# Patient Record
Sex: Male | Born: 1945
Health system: Southern US, Community
[De-identification: ages and names within clinical notes are randomized; demographics above are authoritative.]

## PROBLEM LIST (undated history)

## (undated) DIAGNOSIS — D509 Iron deficiency anemia, unspecified: Secondary | ICD-10-CM

## (undated) DIAGNOSIS — J45909 Unspecified asthma, uncomplicated: Secondary | ICD-10-CM

## (undated) DIAGNOSIS — K760 Fatty (change of) liver, not elsewhere classified: Secondary | ICD-10-CM

## (undated) DIAGNOSIS — G4730 Sleep apnea, unspecified: Secondary | ICD-10-CM

## (undated) DIAGNOSIS — W57XXXA Bitten or stung by nonvenomous insect and other nonvenomous arthropods, initial encounter: Secondary | ICD-10-CM

## (undated) DIAGNOSIS — K5730 Diverticulosis of large intestine without perforation or abscess without bleeding: Secondary | ICD-10-CM

## (undated) DIAGNOSIS — H409 Unspecified glaucoma: Secondary | ICD-10-CM

## (undated) DIAGNOSIS — I10 Essential (primary) hypertension: Secondary | ICD-10-CM

## (undated) DIAGNOSIS — N410 Acute prostatitis: Secondary | ICD-10-CM

## (undated) DIAGNOSIS — M1990 Unspecified osteoarthritis, unspecified site: Secondary | ICD-10-CM

## (undated) DIAGNOSIS — K5903 Drug induced constipation: Secondary | ICD-10-CM

## (undated) DIAGNOSIS — K219 Gastro-esophageal reflux disease without esophagitis: Secondary | ICD-10-CM

## (undated) DIAGNOSIS — E7800 Pure hypercholesterolemia, unspecified: Secondary | ICD-10-CM

## (undated) DIAGNOSIS — L910 Hypertrophic scar: Secondary | ICD-10-CM

## (undated) DIAGNOSIS — Z86010 Personal history of colonic polyps: Secondary | ICD-10-CM

## (undated) DIAGNOSIS — M47816 Spondylosis without myelopathy or radiculopathy, lumbar region: Secondary | ICD-10-CM

## (undated) DIAGNOSIS — T7840XA Allergy, unspecified, initial encounter: Secondary | ICD-10-CM

## (undated) DIAGNOSIS — M8580 Other specified disorders of bone density and structure, unspecified site: Secondary | ICD-10-CM

## (undated) HISTORY — DX: Unspecified asthma, uncomplicated: J45.909

## (undated) HISTORY — DX: Fatty (change of) liver, not elsewhere classified: K76.0

## (undated) HISTORY — PX: SPINE SURGERY: SHX786

## (undated) HISTORY — DX: Allergy, unspecified, initial encounter: T78.40XA

## (undated) HISTORY — DX: Essential (primary) hypertension: I10

## (undated) HISTORY — DX: Iron deficiency anemia, unspecified: D50.9

## (undated) HISTORY — DX: Diverticulosis of large intestine without perforation or abscess without bleeding: K57.30

## (undated) HISTORY — DX: Personal history of colonic polyps: Z86.010

## (undated) HISTORY — DX: Other specified disorders of bone density and structure, unspecified site: M85.80

## (undated) HISTORY — PX: JOINT REPLACEMENT: SHX530

## (undated) HISTORY — DX: Spondylosis without myelopathy or radiculopathy, lumbar region: M47.816

## (undated) HISTORY — DX: Acute prostatitis: N41.0

## (undated) HISTORY — PX: COLONOSCOPY: SHX174

## (undated) HISTORY — DX: Sleep apnea, unspecified: G47.30

---

## 2000-10-20 ENCOUNTER — Encounter: Payer: Self-pay | Admitting: Internal Medicine

## 2000-10-20 ENCOUNTER — Ambulatory Visit (HOSPITAL_BASED_OUTPATIENT_CLINIC_OR_DEPARTMENT_OTHER): Admission: RE | Admit: 2000-10-20 | Discharge: 2000-10-20 | Payer: Self-pay | Admitting: Internal Medicine

## 2000-11-08 DIAGNOSIS — Z860100 Personal history of colon polyps, unspecified: Secondary | ICD-10-CM

## 2000-11-08 HISTORY — DX: Personal history of colonic polyps: Z86.010

## 2000-11-08 HISTORY — DX: Personal history of colon polyps, unspecified: Z86.0100

## 2000-11-16 ENCOUNTER — Ambulatory Visit (HOSPITAL_COMMUNITY): Admission: RE | Admit: 2000-11-16 | Discharge: 2000-11-16 | Payer: Self-pay | Admitting: Gastroenterology

## 2000-11-16 ENCOUNTER — Encounter (INDEPENDENT_AMBULATORY_CARE_PROVIDER_SITE_OTHER): Payer: Self-pay

## 2000-12-26 ENCOUNTER — Encounter: Payer: Self-pay | Admitting: Pulmonary Disease

## 2000-12-26 ENCOUNTER — Ambulatory Visit (HOSPITAL_BASED_OUTPATIENT_CLINIC_OR_DEPARTMENT_OTHER): Admission: RE | Admit: 2000-12-26 | Discharge: 2000-12-26 | Payer: Self-pay | Admitting: Pulmonary Disease

## 2001-11-08 HISTORY — PX: CHOLECYSTECTOMY: SHX55

## 2001-11-08 HISTORY — PX: BACK SURGERY: SHX140

## 2002-01-04 ENCOUNTER — Inpatient Hospital Stay (HOSPITAL_COMMUNITY): Admission: EM | Admit: 2002-01-04 | Discharge: 2002-01-06 | Payer: Self-pay | Admitting: Emergency Medicine

## 2002-01-05 ENCOUNTER — Encounter: Payer: Self-pay | Admitting: Orthopedic Surgery

## 2002-02-07 ENCOUNTER — Encounter: Payer: Self-pay | Admitting: Neurosurgery

## 2002-02-09 ENCOUNTER — Ambulatory Visit (HOSPITAL_COMMUNITY): Admission: RE | Admit: 2002-02-09 | Discharge: 2002-02-11 | Payer: Self-pay | Admitting: Neurosurgery

## 2002-02-09 ENCOUNTER — Encounter: Payer: Self-pay | Admitting: Neurosurgery

## 2002-05-11 ENCOUNTER — Encounter: Payer: Self-pay | Admitting: Emergency Medicine

## 2002-05-11 ENCOUNTER — Emergency Department (HOSPITAL_COMMUNITY): Admission: EM | Admit: 2002-05-11 | Discharge: 2002-05-11 | Payer: Self-pay | Admitting: Emergency Medicine

## 2002-05-30 ENCOUNTER — Observation Stay (HOSPITAL_COMMUNITY): Admission: RE | Admit: 2002-05-30 | Discharge: 2002-05-31 | Payer: Self-pay | Admitting: General Surgery

## 2002-05-30 ENCOUNTER — Encounter (INDEPENDENT_AMBULATORY_CARE_PROVIDER_SITE_OTHER): Payer: Self-pay | Admitting: Specialist

## 2004-05-27 ENCOUNTER — Encounter: Payer: Self-pay | Admitting: Internal Medicine

## 2005-05-03 ENCOUNTER — Ambulatory Visit: Payer: Self-pay | Admitting: Internal Medicine

## 2005-12-29 ENCOUNTER — Ambulatory Visit: Payer: Self-pay | Admitting: Internal Medicine

## 2006-06-13 ENCOUNTER — Ambulatory Visit: Payer: Self-pay | Admitting: Internal Medicine

## 2006-06-13 ENCOUNTER — Ambulatory Visit: Payer: Self-pay | Admitting: Pulmonary Disease

## 2006-06-15 ENCOUNTER — Ambulatory Visit: Payer: Self-pay | Admitting: Internal Medicine

## 2007-08-02 ENCOUNTER — Ambulatory Visit: Payer: Self-pay | Admitting: Internal Medicine

## 2007-08-02 DIAGNOSIS — K5730 Diverticulosis of large intestine without perforation or abscess without bleeding: Secondary | ICD-10-CM | POA: Insufficient documentation

## 2007-08-02 DIAGNOSIS — Z9889 Other specified postprocedural states: Secondary | ICD-10-CM | POA: Insufficient documentation

## 2007-08-02 DIAGNOSIS — G4730 Sleep apnea, unspecified: Secondary | ICD-10-CM | POA: Insufficient documentation

## 2007-08-02 DIAGNOSIS — I10 Essential (primary) hypertension: Secondary | ICD-10-CM

## 2007-08-02 HISTORY — DX: Essential (primary) hypertension: I10

## 2007-08-02 LAB — CONVERTED CEMR LAB
Bilirubin Urine: NEGATIVE
Blood in Urine, dipstick: NEGATIVE
Glucose, Urine, Semiquant: NEGATIVE
Ketones, urine, test strip: NEGATIVE
Nitrite: NEGATIVE
Protein, U semiquant: NEGATIVE
Specific Gravity, Urine: 1.015
Urobilinogen, UA: NEGATIVE
WBC Urine, dipstick: NEGATIVE
pH: 6.5

## 2007-08-04 LAB — CONVERTED CEMR LAB
ALT: 30 units/L (ref 0–53)
AST: 22 units/L (ref 0–37)
BUN: 10 mg/dL (ref 6–23)
Basophils Absolute: 0 10*3/uL (ref 0.0–0.1)
Basophils Relative: 0.2 % (ref 0.0–1.0)
CO2: 36 meq/L — ABNORMAL HIGH (ref 19–32)
Calcium: 9.2 mg/dL (ref 8.4–10.5)
Chloride: 105 meq/L (ref 96–112)
Cholesterol: 161 mg/dL (ref 0–200)
Creatinine, Ser: 1 mg/dL (ref 0.4–1.5)
Eosinophils Absolute: 0.3 10*3/uL (ref 0.0–0.6)
Eosinophils Relative: 6.9 % — ABNORMAL HIGH (ref 0.0–5.0)
GFR calc Af Amer: 99 mL/min
GFR calc non Af Amer: 82 mL/min
Glucose, Bld: 85 mg/dL (ref 70–99)
HCT: 39.4 % (ref 39.0–52.0)
HDL: 36.3 mg/dL — ABNORMAL LOW (ref >39.0)
Hemoglobin: 13.2 g/dL (ref 13.0–17.0)
LDL Cholesterol: 106 mg/dL — ABNORMAL HIGH (ref 0–99)
Lymphocytes Relative: 38.8 % (ref 12.0–46.0)
MCHC: 33.5 g/dL (ref 30.0–36.0)
MCV: 89.4 fL (ref 78.0–100.0)
Monocytes Absolute: 0.6 10*3/uL (ref 0.2–0.7)
Monocytes Relative: 14.7 % — ABNORMAL HIGH (ref 3.0–11.0)
Neutro Abs: 1.6 10*3/uL (ref 1.4–7.7)
Neutrophils Relative %: 39.4 % — ABNORMAL LOW (ref 43.0–77.0)
PSA: 0.55 ng/mL (ref 0.10–4.00)
Platelets: 239 10*3/uL (ref 150–400)
Potassium: 3.9 meq/L (ref 3.5–5.1)
RBC: 4.4 M/uL (ref 4.22–5.81)
RDW: 12.8 % (ref 11.5–14.6)
Sodium: 144 meq/L (ref 135–145)
TSH: 1.44 microintl units/mL (ref 0.35–5.50)
Total CHOL/HDL Ratio: 4.4
Triglycerides: 93 mg/dL (ref 0–149)
VLDL: 19 mg/dL (ref 0–40)
WBC: 4 10*3/uL — ABNORMAL LOW (ref 4.5–10.5)

## 2007-10-31 ENCOUNTER — Encounter: Payer: Self-pay | Admitting: Internal Medicine

## 2007-12-01 ENCOUNTER — Telehealth: Payer: Self-pay | Admitting: Internal Medicine

## 2007-12-12 ENCOUNTER — Encounter: Payer: Self-pay | Admitting: Internal Medicine

## 2007-12-25 ENCOUNTER — Telehealth (INDEPENDENT_AMBULATORY_CARE_PROVIDER_SITE_OTHER): Payer: Self-pay | Admitting: *Deleted

## 2008-03-18 ENCOUNTER — Telehealth (INDEPENDENT_AMBULATORY_CARE_PROVIDER_SITE_OTHER): Payer: Self-pay | Admitting: *Deleted

## 2008-08-14 ENCOUNTER — Ambulatory Visit: Payer: Self-pay | Admitting: Internal Medicine

## 2008-09-25 ENCOUNTER — Telehealth (INDEPENDENT_AMBULATORY_CARE_PROVIDER_SITE_OTHER): Payer: Self-pay | Admitting: *Deleted

## 2008-12-18 ENCOUNTER — Encounter: Payer: Self-pay | Admitting: Internal Medicine

## 2009-01-10 ENCOUNTER — Encounter: Payer: Self-pay | Admitting: Internal Medicine

## 2009-01-19 ENCOUNTER — Encounter: Payer: Self-pay | Admitting: Internal Medicine

## 2009-01-19 ENCOUNTER — Emergency Department (HOSPITAL_BASED_OUTPATIENT_CLINIC_OR_DEPARTMENT_OTHER): Admission: EM | Admit: 2009-01-19 | Discharge: 2009-01-19 | Payer: Self-pay | Admitting: Emergency Medicine

## 2009-01-19 ENCOUNTER — Ambulatory Visit: Payer: Self-pay | Admitting: Diagnostic Radiology

## 2009-01-19 ENCOUNTER — Telehealth: Payer: Self-pay | Admitting: Family Medicine

## 2009-01-20 ENCOUNTER — Ambulatory Visit: Payer: Self-pay | Admitting: Internal Medicine

## 2009-01-20 DIAGNOSIS — M549 Dorsalgia, unspecified: Secondary | ICD-10-CM | POA: Insufficient documentation

## 2009-02-12 ENCOUNTER — Ambulatory Visit: Payer: Self-pay | Admitting: Internal Medicine

## 2009-03-13 ENCOUNTER — Ambulatory Visit: Payer: Self-pay | Admitting: Pulmonary Disease

## 2009-03-13 DIAGNOSIS — Z9989 Dependence on other enabling machines and devices: Secondary | ICD-10-CM

## 2009-03-13 DIAGNOSIS — G4733 Obstructive sleep apnea (adult) (pediatric): Secondary | ICD-10-CM | POA: Insufficient documentation

## 2009-03-13 HISTORY — DX: Obstructive sleep apnea (adult) (pediatric): G47.33

## 2009-03-31 ENCOUNTER — Telehealth (INDEPENDENT_AMBULATORY_CARE_PROVIDER_SITE_OTHER): Payer: Self-pay | Admitting: *Deleted

## 2009-04-02 ENCOUNTER — Encounter: Payer: Self-pay | Admitting: Gastroenterology

## 2009-04-15 ENCOUNTER — Encounter: Payer: Self-pay | Admitting: Pulmonary Disease

## 2009-07-25 ENCOUNTER — Telehealth (INDEPENDENT_AMBULATORY_CARE_PROVIDER_SITE_OTHER): Payer: Self-pay | Admitting: *Deleted

## 2009-10-15 ENCOUNTER — Telehealth (INDEPENDENT_AMBULATORY_CARE_PROVIDER_SITE_OTHER): Payer: Self-pay | Admitting: *Deleted

## 2009-10-15 ENCOUNTER — Encounter (INDEPENDENT_AMBULATORY_CARE_PROVIDER_SITE_OTHER): Payer: Self-pay | Admitting: *Deleted

## 2009-12-22 ENCOUNTER — Encounter: Payer: Self-pay | Admitting: Pulmonary Disease

## 2010-01-08 ENCOUNTER — Ambulatory Visit: Payer: Self-pay | Admitting: Internal Medicine

## 2010-01-08 LAB — CONVERTED CEMR LAB: Vit D, 25-Hydroxy: 16 ng/mL — ABNORMAL LOW (ref 30–89)

## 2010-01-09 ENCOUNTER — Telehealth: Payer: Self-pay | Admitting: Internal Medicine

## 2010-01-09 ENCOUNTER — Encounter (INDEPENDENT_AMBULATORY_CARE_PROVIDER_SITE_OTHER): Payer: Self-pay | Admitting: *Deleted

## 2010-01-12 LAB — CONVERTED CEMR LAB
ALT: 27 units/L (ref 0–53)
AST: 24 units/L (ref 0–37)
BUN: 10 mg/dL (ref 6–23)
Basophils Absolute: 0 10*3/uL (ref 0.0–0.1)
Basophils Relative: 0.6 % (ref 0.0–3.0)
CO2: 34 meq/L — ABNORMAL HIGH (ref 19–32)
Calcium: 9.3 mg/dL (ref 8.4–10.5)
Chloride: 102 meq/L (ref 96–112)
Cholesterol: 163 mg/dL (ref 0–200)
Creatinine, Ser: 0.9 mg/dL (ref 0.4–1.5)
Eosinophils Absolute: 0.2 10*3/uL (ref 0.0–0.7)
Eosinophils Relative: 5.1 % — ABNORMAL HIGH (ref 0.0–5.0)
GFR calc non Af Amer: 110.23 mL/min (ref >60)
Glucose, Bld: 99 mg/dL (ref 70–99)
HCT: 42.6 % (ref 39.0–52.0)
HDL: 45.8 mg/dL (ref >39.00)
Hemoglobin: 13.8 g/dL (ref 13.0–17.0)
LDL Cholesterol: 105 mg/dL — ABNORMAL HIGH (ref 0–99)
Lymphocytes Relative: 37.8 % (ref 12.0–46.0)
Lymphs Abs: 1.4 10*3/uL (ref 0.7–4.0)
MCHC: 32.5 g/dL (ref 30.0–36.0)
MCV: 91.3 fL (ref 78.0–100.0)
Monocytes Absolute: 0.5 10*3/uL (ref 0.1–1.0)
Monocytes Relative: 14.6 % — ABNORMAL HIGH (ref 3.0–12.0)
Neutro Abs: 1.6 10*3/uL (ref 1.4–7.7)
Neutrophils Relative %: 41.9 % — ABNORMAL LOW (ref 43.0–77.0)
PSA: 0.68 ng/mL (ref 0.10–4.00)
Platelets: 197 10*3/uL (ref 150.0–400.0)
Potassium: 3.5 meq/L (ref 3.5–5.1)
RBC: 4.66 M/uL (ref 4.22–5.81)
RDW: 12.6 % (ref 11.5–14.6)
Sodium: 141 meq/L (ref 135–145)
TSH: 1.16 microintl units/mL (ref 0.35–5.50)
Total CHOL/HDL Ratio: 4
Triglycerides: 61 mg/dL (ref 0.0–149.0)
VLDL: 12.2 mg/dL (ref 0.0–40.0)
WBC: 3.7 10*3/uL — ABNORMAL LOW (ref 4.5–10.5)

## 2010-02-10 ENCOUNTER — Encounter (INDEPENDENT_AMBULATORY_CARE_PROVIDER_SITE_OTHER): Payer: Self-pay | Admitting: *Deleted

## 2010-02-11 ENCOUNTER — Ambulatory Visit: Payer: Self-pay | Admitting: Gastroenterology

## 2010-02-18 ENCOUNTER — Ambulatory Visit: Payer: Self-pay | Admitting: Gastroenterology

## 2010-03-12 ENCOUNTER — Ambulatory Visit: Payer: Self-pay | Admitting: Pulmonary Disease

## 2010-05-08 ENCOUNTER — Telehealth (INDEPENDENT_AMBULATORY_CARE_PROVIDER_SITE_OTHER): Payer: Self-pay | Admitting: *Deleted

## 2010-09-08 ENCOUNTER — Emergency Department (HOSPITAL_BASED_OUTPATIENT_CLINIC_OR_DEPARTMENT_OTHER): Admission: EM | Admit: 2010-09-08 | Discharge: 2010-09-08 | Payer: Self-pay | Admitting: Emergency Medicine

## 2010-11-13 ENCOUNTER — Encounter: Payer: Self-pay | Admitting: Pulmonary Disease

## 2010-12-06 LAB — CONVERTED CEMR LAB
ALT: 22 units/L (ref 0–53)
AST: 21 units/L (ref 0–37)
Albumin: 4 g/dL (ref 3.5–5.2)
Alkaline Phosphatase: 71 units/L (ref 39–117)
BUN: 14 mg/dL (ref 6–23)
Basophils Absolute: 0 10*3/uL (ref 0.0–0.1)
Basophils Relative: 0.3 % (ref 0.0–3.0)
Bilirubin, Direct: 0.1 mg/dL (ref 0.0–0.3)
CO2: 35 meq/L — ABNORMAL HIGH (ref 19–32)
Calcium: 9 mg/dL (ref 8.4–10.5)
Chloride: 102 meq/L (ref 96–112)
Cholesterol: 159 mg/dL (ref 0–200)
Creatinine, Ser: 1 mg/dL (ref 0.4–1.5)
Eosinophils Absolute: 0.3 10*3/uL (ref 0.0–0.7)
Eosinophils Relative: 6.3 % — ABNORMAL HIGH (ref 0.0–5.0)
GFR calc Af Amer: 98 mL/min
GFR calc non Af Amer: 81 mL/min
Glucose, Bld: 90 mg/dL (ref 70–99)
HCT: 40.5 % (ref 39.0–52.0)
HDL: 38.9 mg/dL — ABNORMAL LOW (ref >39.0)
Hemoglobin: 13.9 g/dL (ref 13.0–17.0)
LDL Cholesterol: 107 mg/dL — ABNORMAL HIGH (ref 0–99)
Lymphocytes Relative: 35.3 % (ref 12.0–46.0)
MCHC: 34.4 g/dL (ref 30.0–36.0)
MCV: 87.6 fL (ref 78.0–100.0)
Monocytes Absolute: 0.7 10*3/uL (ref 0.1–1.0)
Monocytes Relative: 17.4 % — ABNORMAL HIGH (ref 3.0–12.0)
Neutro Abs: 1.6 10*3/uL (ref 1.4–7.7)
Neutrophils Relative %: 40.7 % — ABNORMAL LOW (ref 43.0–77.0)
PSA: 0.51 ng/mL (ref 0.10–4.00)
Platelets: 217 10*3/uL (ref 150–400)
Potassium: 3.7 meq/L (ref 3.5–5.1)
RBC: 4.62 M/uL (ref 4.22–5.81)
RDW: 12.6 % (ref 11.5–14.6)
Sodium: 143 meq/L (ref 135–145)
TSH: 1.24 microintl units/mL (ref 0.35–5.50)
Total Bilirubin: 1 mg/dL (ref 0.3–1.2)
Total CHOL/HDL Ratio: 4.1
Total Protein: 6.7 g/dL (ref 6.0–8.3)
Triglycerides: 64 mg/dL (ref 0–149)
VLDL: 13 mg/dL (ref 0–40)
WBC: 4 10*3/uL — ABNORMAL LOW (ref 4.5–10.5)

## 2010-12-08 NOTE — Miscellaneous (Signed)
Summary: [facility] Previsit/prep  Clinical Lists Changes  Medications: Added new medication of MOVIPREP 100 GM  SOLR (PEG-KCL-NACL-NASULF-NA ASC-C) As per prep instructions. - Signed Rx of MOVIPREP 100 GM  SOLR (PEG-KCL-NACL-NASULF-NA ASC-C) As per prep instructions.;  #1 x 0;  Signed;  Entered by: Wyona Almas RN;  Authorized by: Mardella Layman MD [facility];  Method used: Electronically to [facility] [address] [place], [place], [place]  [address], Ph: [phone] or [phone], Fax: [phone] Allergies: Changed allergy or adverse reaction from * CLINDAMYCIN to * CLINDAMYCIN    Prescriptions: MOVIPREP 100 GM  SOLR (PEG-KCL-NACL-NASULF-NA ASC-C) As per prep instructions.  #1 x 0   Entered by:   Wyona Almas RN   Authorized by:   Mardella Layman MD [facility]   Signed by:   Wyona Almas RN on 02/11/2010   Method used:   Electronically to        [facility] [address] (retail)       [address]       [place], [place]  [address]       Ph: [phone] or [phone]       Fax: [phone]   RxID:   [id number]

## 2010-12-08 NOTE — Assessment & Plan Note (Signed)
Summary: rov for osa   Copy to:  [facility] Primary Provider/Referring Provider:  Nolon Rod. Paz MD  CC:  Pt is here for a 1 yr f/u appt.   Pt states he is wearing his cpap machine every night.  Approx 6 hours per night.  Pt denied any complaints with mask or pressure.  Pt denied any new concerns. .  History of Present Illness: the pt comes in today for f/u of his known osa.  He is wearing cpap compliantly, and reports no issues with his mask or pressure.  He uses everynight, and feels he sleeps well with excellent daytime alertness.  Current Medications (verified): 1)  Hydrochlorothiazide 25 Mg  Tabs (Hydrochlorothiazide) .Marland Kitchen.. 1 By Mouth Qd 2)  Aspirin 81 Mg  Tbec (Aspirin) 3)  Metoprolol Succinate 25 Mg Xr24h-Tab (Metoprolol Succinate) .... 1/2 By Mouth Once Daily 4)  Klor-Con M20 20 Meq  Tbcr (Potassium Chloride Crys Cr) .... One P.o. Daily 5)  Ergocalciferol 50000 Unit Caps (Ergocalciferol) .Marland Kitchen.. 1 By Mouth Qwk X 12 Weeks  Allergies (verified): 1)  ! * Clindamycin  Review of Systems      See HPI  Vital Signs:  Patient profile:   65 year old male Height:      72 inches Weight:      337 pounds BMI:     45.87 O2 Sat:      95 % on Room air Temp:     98.0 degrees F oral Pulse rate:   69 / minute BP sitting:   114 / 70  (left arm) Cuff size:   large  Vitals Entered By: Arman Filter LPN (Mar 12, 4741 9:19 AM)  O2 Flow:  Room air CC: Pt is here for a 1 yr f/u appt.   Pt states he is wearing his cpap machine every night.  Approx 6 hours per night.  Pt denied any complaints with mask or pressure.  Pt denied any new concerns.  Comments Medications reviewed with patient Arman Filter LPN  Mar 13, 5955 9:19 AM    Physical Exam  General:  obese male in nad Nose:  no skin breakdown or pressure necrosis from cpap mask   Impression & Recommendations:  Problem # 1:  OBSTRUCTIVE SLEEP APNEA (ICD-327.23)  the pt is doing well with cpap.  He is satisfied with his sleep and daytime  alertness.  I have encouraged him to work aggressively on weight loss.  Other Orders: Est. Patient Level II (38756)  Patient Instructions: 1)  no change in cpap 2)  work on weight loss 3)  followup with me in 1 year.

## 2010-12-08 NOTE — Procedures (Signed)
Summary: Recall / [facility]  Recall /  [facility]   Imported By: Lennie Odor 04/10/2010 14:11:11  _____________________________________________________________________  External Attachment:    Type:   Image     Comment:   External Document

## 2010-12-08 NOTE — Procedures (Signed)
Summary: Colonoscopy  Patient: Cameron [name] Note: All result statuses are Final unless otherwise noted.  Tests: (1) Colonoscopy (COL)   COL Colonoscopy           DONE     [facility]     [address]     [place], [place]  [address]           COLONOSCOPY PROCEDURE REPORT           PATIENT:  Cameron, [name]  MR#:  [id number]     BIRTHDATE:  1946-03-06, 63 yrs. old  GENDER:  male     ENDOSCOPIST:  Vania Rea. Jarold Motto, MD, [facility]     REF. BY:     PROCEDURE DATE:  02/18/2010     PROCEDURE:  Higher-risk screening colonoscopy G0105           ASA CLASS:  Class II     INDICATIONS:  family history of colon cancer, history of     pre-cancerous (adenomatous) colon polyps, surveillance and     high-risk screening     MEDICATIONS:   Fentanyl 25 mcg IV, Versed 5 mg IV           DESCRIPTION OF PROCEDURE:   After the risks benefits and     alternatives of the procedure were thoroughly explained, informed     consent was obtained.  Digital rectal exam was performed and     revealed no abnormalities.   The LB [id number] endoscope     was introduced through the anus and advanced to the cecum, which     was identified by both the appendix and ileocecal valve, without     limitations.  The quality of the prep was excellent, using     MoviPrep.  The instrument was then slowly withdrawn as the colon     was fully examined.     <<PROCEDUREIMAGES>>           FINDINGS:  Severe diverticulosis was found in the sigmoid to     descending colon segments.  No polyps or cancers were seen.  This     was otherwise a normal examination of the colon.   Retroflexed     views in the rectum revealed no abnormalities.    The scope was     then withdrawn from the patient and the procedure completed.           COMPLICATIONS:  None     ENDOSCOPIC IMPRESSION:     1) Severe diverticulosis in the sigmoid to descending colon     segments     2) No polyps or cancers     3) Otherwise normal  examination     RECOMMENDATIONS:     1) high fiber diet     2) Follow up colonoscopy in 5 years     REPEAT EXAM:  No           ______________________________     Vania Rea. Jarold Motto, MD, [facility]           CC:  Willow Ora, MD           n.     Rosalie Doctor:   Vania Rea. Patterson at 02/18/2010 09:03 AM           Marybelle Killings, [id number]  Note: An exclamation mark (!) indicates a result that was not dispersed into the flowsheet. Document Creation Date: 02/18/2010 9:04 AM _______________________________________________________________________  (1) Order result status: Final Collection or observation  date-time: 02/18/2010 08:58 Requested date-time:  Receipt date-time:  Reported date-time:  Referring Physician:   Ordering Physician: Sheryn Bison [phone]) Specimen Source:  Source: Launa Grill Order Number: [id number] Lab site:   Appended Document: Colonoscopy    Clinical Lists Changes  Observations: Added new observation of COLONNXTDUE: 02/2015 (02/18/2010 14:13)

## 2010-12-08 NOTE — Progress Notes (Signed)
Summary: GENERIC ALTERNATIVE TOPROL//PAZ  Phone Note From Pharmacy   Caller: [facility]*[phone] EXT [phone] Summary of Call: CALLED IN REF TO RX TOPROL PT ASKED FOR  GENERIC  METPROL SUCCINATE COULD SAVE PT $198 A YEAR FOR 90 DAY SUPPLY IF DOCTOR  APPROVES SEND 90 DAY RX FOR METOPROL SUCCINATE. REF [id number]  Initial call taken by: Kandice Hams,  July 25, 2009 4:32 PM  Follow-up for Phone Call        ok to switch if patient agreable same dosing  Jose E. Paz MD  July 26, 2009 11:13 AM  Rx changed and pt says he is agreeable to the change    New/Updated Medications: METOPROLOL SUCCINATE 25 MG XR24H-TAB (METOPROLOL SUCCINATE) 1/2 by mouth once daily Prescriptions: METOPROLOL SUCCINATE 25 MG XR24H-TAB (METOPROLOL SUCCINATE) 1/2 by mouth once daily  #90 x 0   Entered by:   Kandice Hams   Authorized by:   Nolon Rod. Paz MD   Signed by:   Kandice Hams on 07/28/2009   Method used:   Faxed to ...       [facility]* (mail-order)             ,          Ph: [phone]       Fax: [phone]   RxID:   [id number]

## 2010-12-08 NOTE — Letter (Signed)
Summary: CMN for CPAP Supplies/[facility]  CMN for CPAP Supplies/[facility]   Imported By: Sherian Rein 04/21/2009 08:54:34  _____________________________________________________________________  External Attachment:    Type:   Image     Comment:   External Document

## 2010-12-08 NOTE — Procedures (Signed)
Summary: Colonoscopy/[facility]  Colonoscopy/[facility]   Imported By: Lanelle Bal 01/15/2010 09:36:11  _____________________________________________________________________  External Attachment:    Type:   Image     Comment:   External Document

## 2010-12-08 NOTE — Letter (Signed)
Summary: Previsit letter  [facility]  [address] [place], [place] [address]   Phone: [phone]  Fax: [phone]       01/09/2010 MRN: [id number]  [facility] [address] [place], [place]  [address]  Dear Mr. Atteberry,  Welcome to the Gastroenterology Division at [facility].    You are scheduled to see a nurse for your pre-procedure visit on 02/11/2010 at 4:00pm on the 3rd floor at [facility], [address]  We ask that you try to arrive at our office 15 minutes prior to your appointment time to allow for check-in.  Your nurse visit will consist of discussing your medical and surgical history, your immediate family medical history, and your medications.    Please bring a complete list of all your medications or, if you prefer, bring the medication bottles and we will list them.  We will need to be aware of both prescribed and over the counter drugs.  We will need to know exact dosage information as well.  If you are on blood thinners (Coumadin, Plavix, Aggrenox, Ticlid, etc.) please call our office today/prior to your appointment, as we need to consult with your physician about holding your medication.   Please be prepared to read and sign documents such as consent forms, a financial agreement, and acknowledgement forms.  If necessary, and with your consent, a friend or relative is welcome to sit-in on the nurse visit with you.  Please bring your insurance card so that we may make a copy of it.  If your insurance requires a referral to see a specialist, please bring your referral form from your primary care physician.  No co-pay is required for this nurse visit.     If you cannot keep your appointment, please call [phone] to cancel or reschedule prior to your appointment date.  This allows [place] the opportunity to schedule an appointment for another patient in need of care.    Thank you for choosing [facility] for your medical  needs.  We appreciate the opportunity to care for you.  Please visit [place] at our website  to learn more about our practice.                     Sincerely.                                                                                                                   The Gastroenterology Division

## 2010-12-08 NOTE — Progress Notes (Signed)
Summary: REFILL  Phone Note Refill Request Message from:  Pharmacy on [facility]/FAX# [phone]  Refills Requested: Medication #1:  HYDROCHLOROTHIAZIDE 25 MG  TABS 1 by mouth qd TAKE 1 TABLET DAILY  Initial call taken by: Barb Merino,  Mar 31, 2009 12:34 PM      Prescriptions: HYDROCHLOROTHIAZIDE 25 MG  TABS (HYDROCHLOROTHIAZIDE) 1 by mouth qd  #90 x 1   Entered by:   Kandice Hams   Authorized by:   Nolon Rod. Paz MD   Signed by:   Kandice Hams on 03/31/2009   Method used:   Faxed to ...       [facility]* (mail-order)             ,          Ph: [phone]       Fax: [phone]   RxID:   [id number]

## 2010-12-08 NOTE — Progress Notes (Signed)
Summary: Refill  Phone Note Refill Request Call back at Home Phone [phone] Message from:  Patient wife on May 08, 2010 1:56 PM  Refills Requested: Medication #1:  HYDROCHLOROTHIAZIDE 25 MG  TABS 1 by mouth qd   Supply Requested: 3 months Fax to [facility]  Initial call taken by: Barnie Mort,  May 08, 2010 1:56 PM Caller: Patient Call For: Wildwood E. Paz MD    Prescriptions: HYDROCHLOROTHIAZIDE 25 MG  TABS (HYDROCHLOROTHIAZIDE) 1 by mouth qd  #90 x 1   Entered by:   Army Fossa CMA   Authorized by:   Nolon Rod. Paz MD   Signed by:   Army Fossa CMA on 05/08/2010   Method used:   Faxed to ...       [facility] ([facility])             , [place]         Ph: [phone]       Fax: [phone]   RxID:   [id number]

## 2010-12-08 NOTE — Progress Notes (Signed)
Summary: REFILL FOR TOPROL-lmom  Phone Note Refill Request   Refills Requested: Medication #1:  TOPROL XL 50 MG  TB24 1/2 by mouth qd [BMN]   Last Refilled: 10/26/2007 RX RECEIVED VIA FAX FROM [facility] [place] FAX IS [phone]. THE TOPROL XL IS NOT AVAILABLE WE NEED AN RX FOR THE PLAN METOPROLOL PLZ [facility]  Initial call taken by: Job Founds,  December 01, 2007 11:14 AM  Follow-up for Phone Call        ok to change but tell pt to check BP and be sure is arounf 120/80 Follow-up by: Nolon Rod. Eddie Koc MD,  December 01, 2007 2:15 PM  Additional Follow-up for Phone Call Additional follow up Details #1::        rx faxed to pharmacy  Left message on machine to return call. ..................................................................Marland KitchenShary Decamp  December 01, 2007 2:21 PM discussed with pt ..................................................................Marland KitchenShary Decamp  December 05, 2007 9:29 AM     New/Updated Medications: METOPROLOL TARTRATE 25 MG  TABS (METOPROLOL TARTRATE) 1/2 by mouth bid   Prescriptions: METOPROLOL TARTRATE 25 MG  TABS (METOPROLOL TARTRATE) 1/2 by mouth bid  #60 x 3   Entered and Authorized by:   Nolon Rod. Yaretsi Humphres MD   Signed by:   Nolon Rod. Jaskarn Schweer MD on 12/01/2007   Method used:   Print then Give to Patient   RxID:   [id number]

## 2010-12-08 NOTE — Procedures (Signed)
Summary: colonoscopy report  colonoscopy report   Imported By: Freddy Jaksch 08/07/2007 09:59:00  _____________________________________________________________________  External Attachment:    Type:   Image     Comment:   internal

## 2010-12-08 NOTE — Assessment & Plan Note (Signed)
Summary: acute only - pain shoulder blade,went to ed/cbs   Vital Signs:  Patient profile:   65 year old male Weight:      336.6 pounds Temp:     97.8 degrees F oral Pulse rate:   74 / minute BP sitting:   120 / 82  (left arm)  Vitals Entered By: Jeremy Johann CMA (January 20, 2009 4:07 PM) Comments pain in left shoulder blade x2days, sudden onset   History of Present Illness: pain betwen the L shoulder bladde and spine, on-off x 2 days, worse if bends tie his shoe laces and w/  cough, better w/ certain positions   ER 01-19-09 CXR ----> Minimal streaky basilar atelectasis but no infiltrates, edema or  effusions. cardiac enz x 1 : neg  EKG reportedly done  Allergies: 1)  ! * Clindamycin  Past History:  Past Medical History:    Reviewed history from 08/14/2008 and no changes required:    Hypertension    Diverticulosis, colon    Osteopenia DEXA 12-2006    Sleep apnea  Past Surgical History:    Reviewed history from 08/02/2007 and no changes required:    Cholecystectomy    back surgery 2003  Social History:    Reviewed history from 08/02/2007 and no changes required:       2 kids       Married  Review of Systems General:  Denies fever. CV:  mild cough on-off. Resp:  Denies shortness of breath. GI:  Denies abdominal pain and bloody stools; slightly  constipated x 2 days "feel like I need a BM but I don't". GU:  Denies dysuria and hematuria. Derm:  Denies rash.  Physical Exam  General:  alert and well-developed.   Lungs:  normal respiratory effort, no intercostal retractions, no accessory muscle use, and normal breath sounds.   Heart:  normal rate, regular rhythm, and no murmur.   Abdomen:  soft, non-tender, and no distention.   Msk:  no tender at area of pain (Left  from T10) Extremities:  no pretibial edema bilaterally    Impression & Recommendations:  Problem # 1:  BACK PAIN, UPPER (ICD-724.5) symptoms seem muscle skeletal will get the EKG done at the ER  although symptoms doesn't seem cardiac  we agreed he will try the ibuprofen and steroids Rx @ the ER  and call of no better or if worse declined flexeril also some GI symptoms , slightly  constipated (see instructions)   His updated medication list for this problem includes:    Aspirin 81 Mg Tbec (Aspirin)  Complete Medication List: 1)  Hydrochlorothiazide 25 Mg Tabs (Hydrochlorothiazide) .Marland Kitchen.. 1 by mouth qd 2)  Aspirin 81 Mg Tbec (Aspirin) 3)  Metoprolol Tartrate 25 Mg Tabs (Metoprolol tartrate) .... 1/2 by mouth once daily` 4)  Klor-con M20 20 Meq Tbcr (Potassium chloride crys cr) .... One p.o. daily  Patient Instructions: 1)  myralax 17gr a day with fluids x 2 weeks  2)  The medication list was reviewed and reconciled.  All changed / newly prescribed medications were explained.  A complete medication list was provided to the patient / caregiver.

## 2010-12-08 NOTE — Assessment & Plan Note (Signed)
Summary: CPX--PH   Vital Signs:  Patient Profile:   65 Years Old Male Weight:      336.8 pounds Pulse rate:   60 / minute BP sitting:   110 / 70  Vitals Entered By: Shary Decamp (August 14, 2008 9:43 AM)                 Chief Complaint:  cpx - fasting; bp @ work 120/80 -- pt has only been taking metoprolol 1/2 qd.  History of Present Illness: cpx - fasting bp @ work 120/80  pt has only been taking metoprolol 1/2 once daily occ tingling at R thumb, thisnks related to computer use; no finger numbness    Updated Prior Medication List: HYDROCHLOROTHIAZIDE 25 MG  TABS (HYDROCHLOROTHIAZIDE) 1 by mouth qd ASPIRIN 81 MG  TBEC (ASPIRIN)  METOPROLOL TARTRATE 25 MG  TABS (METOPROLOL TARTRATE) 1/2 by mouth once daily` KLOR-CON M20 20 MEQ  TBCR (POTASSIUM CHLORIDE CRYS CR) one p.o. daily  Current Allergies (reviewed today): ! * CLINDAMYCIN  Past Medical History:    Reviewed history from 08/02/2007 and no changes required:       Hypertension       Diverticulosis, colon       Osteopenia DEXA 12-2006       Sleep apnea  Past Surgical History:    Reviewed history from 08/02/2007 and no changes required:       Cholecystectomy       back surgery 2003   Family History:    Reviewed history from 08/02/2007 and no changes required:       colon ca--uncle       prostate cancer--no        MI--no       no close relatives w/ DM       HTN-- F  Social History:    Reviewed history from 08/02/2007 and no changes required:       2 kids       Married   Risk Factors: Tobacco use:  never Alcohol use:  yes  Colonoscopy History:    Date of Last Colonoscopy:  05/08/2004   Review of Systems       less active than last year in part b/c problems w/ achiles tendon (saw podiatry) diet-- has cut down of fat  CV      Denies chest pain or discomfort.      occ swelling at the ned of the day @ LE  Resp      Denies shortness of breath.  GI      Denies bloody stools, diarrhea, and  nausea.  GU      Denies hematuria and urinary hesitancy.   Physical Exam  General:     alert and overweight-appearing.   Neck:     no thyromegaly.   Lungs:     normal respiratory effort, no intercostal retractions, no accessory muscle use, and normal breath sounds.   Heart:     normal rate, regular rhythm, and no murmur.   Abdomen:     soft, non-tender, normal bowel sounds, no distention, no hepatomegaly, and no splenomegaly.   Rectal:     No external abnormalities noted. Normal sphincter tone. No rectal masses or tenderness. Prostate:     very limited due to pt. size but what was reached was normal Extremities:     trace pretibial edema bilaterally  Psych:     Oriented X3, not anxious appearing, and not depressed appearing.  Impression & Recommendations:  Problem # 1:  HEALTH SCREENING (ICD-V70.0)  Last Colonoscopy:  05/08/2004, per report, next 2010 Td 07 flu shot today diet - exercise discussed EKN NSR Orders: EKG w/ Interpretation (93000) Venipuncture (94854) TLB-BMP (Basic Metabolic Panel-BMET) (80048-METABOL) TLB-CBC Platelet - w/Differential (85025-CBCD) TLB-Hepatic/Liver Function Pnl (80076-HEPATIC) TLB-Lipid Panel (80061-LIPID) TLB-PSA (Prostate Specific Antigen) (84153-PSA) TLB-TSH (Thyroid Stimulating Hormone) (84443-TSH)   Complete Medication List: 1)  Hydrochlorothiazide 25 Mg Tabs (Hydrochlorothiazide) .Marland Kitchen.. 1 by mouth qd 2)  Aspirin 81 Mg Tbec (Aspirin) 3)  Metoprolol Tartrate 25 Mg Tabs (Metoprolol tartrate) .... 1/2 by mouth once daily` 4)  Klor-con M20 20 Meq Tbcr (Potassium chloride crys cr) .... One p.o. daily   Patient Instructions: 1)  Please schedule a follow-up appointment in 6 months.   ]  Influenza Immunization History:    Influenza # 1:  Historical (08/09/2008)

## 2010-12-08 NOTE — Progress Notes (Signed)
Summary: Paz--Refill  Phone Note Refill Request   Refills Requested: Medication #1:  HYDROCHLOROTHIAZIDE 25 MG  TABS 1 by mouth qd RX received via fax from [facility]-[phone]  Initial call taken by: Freddy Jaksch,  Mar 18, 2008 2:31 PM      Prescriptions: HYDROCHLOROTHIAZIDE 25 MG  TABS (HYDROCHLOROTHIAZIDE) 1 by mouth qd  #90 x 1   Entered by:   Shary Decamp   Authorized by:   Nolon Rod. Paz MD   Signed by:   Shary Decamp on 03/18/2008   Method used:   Printed then faxed to ...       [facility]             , [place]         Ph:        Fax: [phone]   RxID:   [id number]

## 2010-12-08 NOTE — Assessment & Plan Note (Signed)
Summary: CPX   PH   Vital Signs:  Patient Profile:   65 Years Old Male Weight:      343 pounds Pulse rate:   64 / minute Resp:     12 per minute BP sitting:   118 / 80  Vitals Entered By: Shary Decamp (August 02, 2007 9:44 AM)             Is Patient Diabetic? No     Chief Complaint:  yearly.  History of Present Illness: CPX On PT for achiles tendinitis (refered by ortho)   Current Allergies (reviewed today): ! * CLINDAMYCIN Updated/Current Medications (including changes made in today's visit):  HYDROCHLOROTHIAZIDE 25 MG  TABS (HYDROCHLOROTHIAZIDE) 1 by mouth qd ASPIRIN 81 MG  TBEC (ASPIRIN)  TOPROL XL 50 MG  TB24 (METOPROLOL SUCCINATE) 1/2 by mouth qd KLOR-CON M20 20 MEQ  TBCR (POTASSIUM CHLORIDE CRYS CR) one p.o. daily   Past Medical History:    Hypertension    Diverticulosis, colon  Past Surgical History:    Cholecystectomy    back surgery 2003   Family History:    Uncle - colon ca    no prostate cancer, MI    no close relatives w/ DM  Social History:    2 kids    Married   Risk Factors:  Tobacco use:  never Alcohol use:  yes    Comments:  socially  Colonoscopy History:     Date of Last Colonoscopy:  05/08/2004    Results:  Diverticulosis    Review of Systems  General      diet? no active, tennis, yard work  CV      Denies chest pain or discomfort.  Resp      Denies cough and shortness of breath.  GI      Denies bloody stools, diarrhea, nausea, and vomiting.  GU      Denies dysuria, hematuria, urinary frequency, and urinary hesitancy.   Physical Exam  General:     alert, well-developed, and overweight-appearing.   Lungs:     normal respiratory effort, no intercostal retractions, and no accessory muscle use.   Heart:     normal rate, regular rhythm, and no murmur.   Abdomen:     soft, non-tender, no hepatomegaly, and no splenomegaly.   Rectal:     No external abnormalities noted. Normal sphincter tone. No rectal  masses or tenderness. Prostate:     very limited due to pt. size Extremities:     trace edema Neurologic:     alert & oriented X3, strength normal in all extremities, and gait normal.   Psych:     good eye contact, not anxious appearing, and not depressed appearing.      Impression & Recommendations:  Problem # 1:  HEALTH SCREENING (ICD-V70.0) doing well counseled about obesity, diet + exercise encourage  labs EKG last year was normal up-to-date on tetanus shots and colonoscopies , in fact he had a colonoscopy in 2002 and a repeat one in 2005. Orders: TLB-Lipid Panel (80061-LIPID) TLB-BMP (Basic Metabolic Panel-BMET) (80048-METABOL) TLB-CBC Platelet - w/Differential (85025-CBCD) TLB-TSH (Thyroid Stimulating Hormone) (84443-TSH) TLB-AST (SGOT) (84450-SGOT) TLB-ALT (SGPT) (84460-ALT) TLB-PSA (Prostate Specific Antigen) (84153-PSA)   Complete Medication List: 1)  Hydrochlorothiazide 25 Mg Tabs (Hydrochlorothiazide) .Marland Kitchen.. 1 by mouth qd 2)  Aspirin 81 Mg Tbec (Aspirin) 3)  Toprol Xl 50 Mg Tb24 (Metoprolol succinate) .... 1/2 by mouth qd 4)  Klor-con M20 20 Meq Tbcr (Potassium chloride crys cr) .... One  p.o. daily  Colorectal Screening:  Colonoscopy Results:    Date of Exam: 05/08/2004    Results: Diverticulosis  Next Colonoscopy Due:    05/08/2009  Immunization & Chemoprophylaxis:    Tetanus vaccine: Historical  (06/08/2006)   Patient Instructions: 1)  diet 2)  exercise 3)  Please schedule a follow-up appointment in 1 year.    Prescriptions: KLOR-CON M20 20 MEQ  TBCR (POTASSIUM CHLORIDE CRYS CR) one p.o. daily  #90 x 4   Entered and Authorized by:   Elita Quick E. Izaiha Lo MD   Signed by:   Nolon Rod. Kobi Aller MD on 08/02/2007   Method used:   Print then Give to Patient   RxID:   [id number] TOPROL XL 50 MG  TB24 (METOPROLOL SUCCINATE) 1/2 by mouth qd  #90 x 4   Entered and Authorized by:   Nolon Rod. Teana Lindahl MD   Signed by:   Nolon Rod. Oleda Borski MD on 08/02/2007   Method used:   Print  then Give to Patient   RxID:   [id number] HYDROCHLOROTHIAZIDE 25 MG  TABS (HYDROCHLOROTHIAZIDE) 1 by mouth qd  #90 x 4   Entered and Authorized by:   Nolon Rod. Glory Graefe MD   Signed by:   Nolon Rod. Worthington Cruzan MD on 08/02/2007   Method used:   Print then Give to Patient   RxID:   [id number]  ]  Preventive Care Screening  Colonoscopy:    Date:  05/08/2004    Results:  Diverticulosis      next c-scope due 2010    Tetanus/Td Immunization History:    Tetanus/Td # 1:  Historical (06/08/2006)  Laboratory Results   Urine Tests  Date/Time Recieved: ..................................................................Marland KitchenFloydene Flock  August 02, 2007 11:00 AM  Routine Urinalysis   Color: yellow Appearance: Hazy Glucose: negative   (Normal Range: Negative) Bilirubin: negative   (Normal Range: Negative) Ketone: negative   (Normal Range: Negative) Spec. Gravity: 1.015   (Normal Range: 1.003-1.035) Blood: negative   (Normal Range: Negative) pH: 6.5   (Normal Range: 5.0-8.0) Protein: negative   (Normal Range: Negative) Urobilinogen: negative   (Normal Range: 0-1) Nitrite: negative   (Normal Range: Negative) Leukocyte Esterace: negative   (Normal Range: Negative)

## 2010-12-08 NOTE — Assessment & Plan Note (Signed)
Summary: acute sick visit for osa   Copy to:  [facility] Primary Provider/Referring Provider:  Nolon Rod. Paz MD  CC:  Sleep Consult. Former Pt.  Pt last saw Dr. Shelle Iron August 2007.  Marland Kitchen  History of Present Illness: The pt is a 65y/o male who I have been asked to see for osa.  He was first diagnosed in 2001, with an AHI of 87/hr, and was optimized to a pressure of 20cm on cpap.  He has been wearing compliantly, and keeping up with mask changes.  Unfortunately, he has not been seen by me since 2007.  He goes to bed around 11pm, and arises at 6am to start his day.  He feels rested upon arising.  His is satisfied with his alertness level during the day.  His only complaint it that his machine is not providing enough humidity, and is making noises.  It is way overdue for replacement.  Preventive Screening-Counseling & Management     Smoking Status: never  Medications Prior to Update: 1)  Hydrochlorothiazide 25 Mg  Tabs (Hydrochlorothiazide) .Marland Kitchen.. 1 By Mouth Qd 2)  Aspirin 81 Mg  Tbec (Aspirin) 3)  Metoprolol Tartrate 25 Mg  Tabs (Metoprolol Tartrate) .... 1/2 By Mouth Once Daily` 4)  Klor-Con M20 20 Meq  Tbcr (Potassium Chloride Crys Cr) .... One P.o. Daily  Allergies (verified): 1)  ! * Clindamycin  Past History:  Past Medical History:    Reviewed history from 08/14/2008 and no changes required:    Hypertension    Diverticulosis, colon    Osteopenia DEXA 12-2006    Sleep apnea  Family History:    colon ca--maternal uncle    prostate cancer--no     MI--no    no close relatives w/ DM    HTN-- F    maternal grandfather- colon  Social History:    2 kids    Married    Patient never smoked.     pt works in [employer]  Review of Systems  The patient denies shortness of breath with activity, shortness of breath at rest, productive cough, non-productive cough, coughing up blood, chest pain, irregular heartbeats, acid heartburn, indigestion, loss of appetite, weight change, abdominal pain,  difficulty swallowing, sore throat, tooth/dental problems, headaches, nasal congestion/difficulty breathing through nose, sneezing, itching, ear ache, anxiety, depression, hand/feet swelling, joint stiffness or pain, rash, change in color of mucus, and fever.    Vital Signs:  Patient profile:   65 year old male Height:      72 inches Weight:      340.13 pounds BMI:     46.30 O2 Sat:      91 % Temp:     98.2 degrees F oral Pulse rate:   89 / minute BP sitting:   124 / 78  (left arm) Cuff size:   large  Vitals Entered By: Arman Filter LPN (Mar 13, 1609 9:10 AM)  O2 Sat on room air at rest %:  91 CC: Sleep Consult. Former Pt.  Pt last saw Dr. Shelle Iron August 2007.   Comments Medications reviewed with patient Arman Filter LPN  Mar 14, 9603 9:10 AM    Physical Exam  General:  morbidly obese male in nad Nose:  no skin breakdown or pressure necrosis from cpap mask Lungs:  clear Heart:  rrr Extremities:  mild edema no clubbing or cyanosis Neurologic:  alert and oriented, moves all 4.   Impression & Recommendations:  Problem # 1:  OBSTRUCTIVE SLEEP APNEA (  ICD-327.23) The pt has known osa, and is overdue for a new cpap machine.  He feels that overall he is doing well, but his machine is making noise and not providing him with enough humidity.  He also needs to work aggressively on weight loss.  He would like to change over to nasal pillows, but will have to decrease his pressure in order to keep them from leaking (he is on a high pressure).  He would like to try this, and see if he can maintain his sleep.  Other Orders: Est. Patient Level IV (22025) DME Referral (DME)  Patient Instructions: 1)  will get you a new cpap machine and set pressure on 18cm 2)  try nasal pillows to see how things go 3)  adjust heater on humidifier for moisture control 4)  work on weight loss 5)  follow up in one year or sooner if problems

## 2010-12-08 NOTE — Letter (Signed)
Summary: CMN for CPAP Supplies/[facility]  CMN for CPAP Supplies/[facility]   Imported By: Lanelle Bal 12/23/2008 13:47:36  _____________________________________________________________________  External Attachment:    Type:   Image     Comment:   External Document

## 2010-12-08 NOTE — Progress Notes (Signed)
Summary: REFILL  Phone Note Refill Request Message from:  Pharmacy on [facility] FAX#[phone]  Refills Requested: Medication #1:  POT CHLOR ER TABS TAKE 1 TABLET DAILY  Initial call taken by: Barb Merino,  Mar 31, 2009 12:33 PM      Prescriptions: KLOR-CON M20 20 MEQ  TBCR (POTASSIUM CHLORIDE CRYS CR) one p.o. daily  #90 x 1   Entered by:   Kandice Hams   Authorized by:   Nolon Rod. Paz MD   Signed by:   Kandice Hams on 03/31/2009   Method used:   Faxed to ...       [facility]* (mail-order)             ,          Ph: [phone]       Fax: [phone]   RxID:   [id number]

## 2010-12-08 NOTE — Miscellaneous (Signed)
Summary: [facility]   [facility]   Imported By: Freddy Jaksch 12/22/2007 09:35:09  _____________________________________________________________________  External Attachment:    Type:   Image     Comment:   External Document

## 2010-12-08 NOTE — Letter (Signed)
Summary: [facility] Instructions  [facility]  [address] [place], [place] [address]   Phone: [phone]  Fax: [phone]       Cameron [name]    Oct 01, 1946    MRN: [id number]        Procedure Day Dorna Bloom:  Wednesday  02/18/10     Arrival Time:  7:30am     Procedure Time:  8:30am     Location of Procedure:                    _X _  [facility] (4th Floor)                        PREPARATION FOR COLONOSCOPY WITH MOVIPREP   Starting 5 days prior to your procedure  Friday 04/08 do not eat nuts, seeds, popcorn, corn, beans, peas,  salads, or any raw vegetables.  Do not take any fiber supplements (e.g. Metamucil, Citrucel, and Benefiber).  THE DAY BEFORE YOUR PROCEDURE         DATE:  04/12   DAY:  Tuesday  1.  Drink clear liquids the entire day-NO SOLID FOOD  2.  Do not drink anything colored red or purple.  Avoid juices with pulp.  No orange juice.  3.  Drink at least 64 oz. (8 glasses) of fluid/clear liquids during the day to prevent dehydration and help the prep work efficiently.  CLEAR LIQUIDS INCLUDE: Water Jello Ice Popsicles Tea (sugar ok, no milk/cream) Powdered fruit flavored drinks Coffee (sugar ok, no milk/cream) Gatorade Juice: apple, white grape, white cranberry  Lemonade Clear bullion, consomm, broth Carbonated beverages (any kind) Strained chicken noodle soup Hard Candy                             4.  In the morning, mix first dose of MoviPrep solution:    Empty 1 Pouch A and 1 Pouch B into the disposable container    Add lukewarm drinking water to the top line of the container. Mix to dissolve    Refrigerate (mixed solution should be used within 24 hrs)  5.  Begin drinking the prep at 5:00 p.m. The MoviPrep container is divided by 4 marks.   Every 15 minutes drink the solution down to the next mark (approximately 8 oz) until the full liter is complete.   6.  Follow completed prep with 16 oz of clear liquid of your  choice (Nothing red or purple).  Continue to drink clear liquids until bedtime.  7.  Before going to bed, mix second dose of MoviPrep solution:    Empty 1 Pouch A and 1 Pouch B into the disposable container    Add lukewarm drinking water to the top line of the container. Mix to dissolve    Refrigerate  THE DAY OF YOUR PROCEDURE      DATE:  04/13  DAY:  Wednesday  Beginning at  3:30 a.m. (5 hours before procedure):         1. Every 15 minutes, drink the solution down to the next mark (approx 8 oz) until the full liter is complete.  2. Follow completed prep with 16 oz. of clear liquid of your choice.    3. You may drink clear liquids until 6:30am  (2 HOURS BEFORE PROCEDURE).   MEDICATION INSTRUCTIONS  Unless otherwise instructed, you should take regular prescription medications with a small sip  of water   as early as possible the morning of your procedure.   Additional medication instructions:Hold HCTZ the morning of procedure.         OTHER INSTRUCTIONS  You will need a responsible adult at least 63 years of age to accompany you and drive you home.   This person must remain in the waiting room during your procedure.  Wear loose fitting clothing that is easily removed.  Leave jewelry and other valuables at home.  However, you may wish to bring a book to read or  an iPod/MP3 player to listen to music as you wait for your procedure to start.  Remove all body piercing jewelry and leave at home.  Total time from sign-in until discharge is approximately 2-3 hours.  You should go home directly after your procedure and rest.  You can resume normal activities the  day after your procedure.  The day of your procedure you should not:   Drive   Make legal decisions   Operate machinery   Drink alcohol   Return to work  You will receive specific instructions about eating, activities and medications before you leave.    The above instructions have been reviewed and  explained to me by   Wyona Almas RN  February 11, 2010 4:28 PM     I fully understand and can verbalize these instructions _____________________________ Date _________

## 2010-12-08 NOTE — Progress Notes (Signed)
Summary: colonoscopy due now  ---- Converted from flag ---- ---- 01/09/2010 1:30 PM, Harlow Mares CMA (AAMA) wrote: per Dr. Jarold Motto patient is due for colonoscopy now. we will contact him.  ---- 01/09/2010 7:58 AM, Shary Decamp wrote: Can you please tell me if this pt is due for a colonoscopy?  ---- 01/08/2010 10:59 AM, Elita Quick E. Paz MD wrote: please  contact GI --per our records he is due for a colonoscopy ------------------------------  Phone Note Outgoing Call Call back at [facility] Phone [phone]   Call placed by: Harlow Mares CMA Duncan Dull),  January 09, 2010 1:40 PM Call placed to: Patient Summary of Call: spoke to patients wife patient is scheduled for colonoscopy on 01/14/2010 and previsit on 01/12/2010.  Initial call taken by: Harlow Mares CMA (AAMA),  January 09, 2010 1:40 PM  Follow-up for Phone Call        thank you  Follow-up by: [facility] E. Paz MD,  January 09, 2010 1:44 PM

## 2010-12-08 NOTE — Assessment & Plan Note (Signed)
Summary: cpx and fasting labs- jr   Vital Signs:  Patient profile:   65 year old male Height:      72 inches Weight:      337.2 pounds BMI:     45.90 Pulse rate:   64 / minute BP sitting:   120 / 80  Vitals Entered By: Shary Decamp (January 08, 2010 9:25 AM) CC: cpx - fasting Is Patient Diabetic? No   History of Present Illness: CPX   Preventive Screening-Counseling & Management  Caffeine-Diet-Exercise     Caffeine use/day: 4  Current Medications (verified): 1)  Hydrochlorothiazide 25 Mg  Tabs (Hydrochlorothiazide) .Marland Kitchen.. 1 By Mouth Qd 2)  Aspirin 81 Mg  Tbec (Aspirin) 3)  Metoprolol Succinate 25 Mg Xr24h-Tab (Metoprolol Succinate) .... 1/2 By Mouth Once Daily 4)  Klor-Con M20 20 Meq  Tbcr (Potassium Chloride Crys Cr) .... One P.o. Daily  Allergies (verified): 1)  ! * Clindamycin  Past History:  Past Medical History: Reviewed history from 08/14/2008 and no changes required. Hypertension Diverticulosis, colon Osteopenia DEXA 12-2006 Sleep apnea  Past Surgical History: Cholecystectomy 2003 back surgery 2003  Family History: Reviewed history from 03/13/2009 and no changes required. colon ca--maternal uncle, GF prostate cancer--no  (?GF) MI--no no close relatives w/ DM HTN-- F    Social History: Reviewed history from 03/13/2009 and no changes required. 2 kids Married Patient never smoked.  ETOH-- socially pt works in [employer] Diet-- no recent changes  exercise-- just started a walking programCaffeine use/day:  4  Review of Systems General:  Denies fatigue and fever. CV:  Denies chest pain or discomfort and swelling of feet. Resp:  Denies cough and shortness of breath. GI:  Denies bloody stools, nausea, and vomiting; frecuently burps  w/o GERD symptoms , dysphagia or odynophagia . GU:  Denies dysuria, hematuria, urinary frequency, and urinary hesitancy. Psych:  Denies anxiety and depression.  Physical Exam  General:  alert, well-developed, and  overweight-appearing.   Neck:  no masses and no thyromegaly.   Lungs:  normal respiratory effort, no intercostal retractions, no accessory muscle use, and normal breath sounds.   Heart:  normal rate, regular rhythm, no murmur, and no gallop.   Abdomen:  soft, non-tender, no distention, and no masses.   Rectal:  No external abnormalities noted. Normal sphincter tone. No rectal masses or tenderness. Prostate:  hard to reach prostate but seems normal  Extremities:  no pretibial edema bilaterally  Psych:  Cognition and judgment appear intact. Alert and cooperative with normal attention span and concentration.  good spirits   Impression & Recommendations:  Problem # 1:  HEALTH SCREENING (ICD-V70.0) Td 07  Last Colonoscopy:  05/08/2004, neg--no polyps  per report, next 2010 ----> will contact GI we again discussed diet: W W?    Orders: Venipuncture ([address]) TLB-BMP (Basic Metabolic Panel-BMET) (80048-METABOL) TLB-CBC Platelet - w/Differential (85025-CBCD) TLB-Lipid Panel (80061-LIPID) TLB-TSH (Thyroid Stimulating Hormone) (84443-TSH) TLB-PSA (Prostate Specific Antigen) (84153-PSA) TLB-ALT (SGPT) (84460-ALT) TLB-AST (SGOT) (84450-SGOT)  Problem # 2:  ? of OSTEOPENIA (ICD-733.90) osteopenia per DEXA 2008? recheck  Orders: T-Vitamin D (25-Hydroxy) [phone]) Radiology Referral (Radiology)  Problem # 3:  he also mentioned he is burping a lot recommend a trial with Prilosec 20 mg on empty stomach daily  Complete Medication List: 1)  Hydrochlorothiazide 25 Mg Tabs (Hydrochlorothiazide) .Marland Kitchen.. 1 by mouth qd 2)  Aspirin 81 Mg Tbec (Aspirin) 3)  Metoprolol Succinate 25 Mg Xr24h-tab (Metoprolol succinate) .... 1/2 by mouth once daily 4)  Klor-con M20 20  Meq Tbcr (Potassium chloride crys cr) .... One p.o. daily  Patient Instructions: 1)  Please schedule a follow-up appointment in 6 months .  Prescriptions: KLOR-CON M20 20 MEQ  TBCR (POTASSIUM CHLORIDE CRYS CR) one p.o. daily  #90 x 2    Entered by:   Shary Decamp   Authorized by:   Nolon Rod. Paz MD   Signed by:   Shary Decamp on 01/08/2010   Method used:   Print then Give to Patient   RxID:   [id number] METOPROLOL SUCCINATE 25 MG XR24H-TAB (METOPROLOL SUCCINATE) 1/2 by mouth once daily  #90 x 2   Entered by:   Shary Decamp   Authorized by:   Nolon Rod. Paz MD   Signed by:   Shary Decamp on 01/08/2010   Method used:   Print then Give to Patient   RxID:   [id number] HYDROCHLOROTHIAZIDE 25 MG  TABS (HYDROCHLOROTHIAZIDE) 1 by mouth qd  #90 x 2   Entered by:   Shary Decamp   Authorized by:   Nolon Rod. Paz MD   Signed by:   Shary Decamp on 01/08/2010   Method used:   Print then Give to Patient   RxID:   [id number]    Preventive Care Screening  Prior Values:    PSA:  0.51 (08/14/2008)    Colonoscopy:  Diverticulosis (05/08/2004)    Last Tetanus Booster:  Historical (06/08/2006)    Risk Factors:  Tobacco use:  never Caffeine use:  4 drinks per day Alcohol use:  yes Exercise:  no  Colonoscopy History:    Date of Last Colonoscopy:  05/08/2004

## 2010-12-10 NOTE — Letter (Signed)
Summary: CMN For Supplies / [employer]  CMN For Supplies / [facility]   Imported By: Lennie Odor 11/20/2010 11:27:09  _____________________________________________________________________  External Attachment:    Type:   Image     Comment:   External Document

## 2011-01-19 LAB — BASIC METABOLIC PANEL
BUN: 15 mg/dL (ref 6–23)
CO2: 29 mEq/L (ref 19–32)
Calcium: 9.9 mg/dL (ref 8.4–10.5)
Chloride: 102 mEq/L (ref 96–112)
Creatinine, Ser: 1 mg/dL (ref 0.4–1.5)
GFR calc Af Amer: >60 mL/min (ref >60)
GFR calc non Af Amer: >60 mL/min (ref >60)
Glucose, Bld: 108 mg/dL — ABNORMAL HIGH (ref 70–99)
Potassium: 4.1 mEq/L (ref 3.5–5.1)
Sodium: 144 mEq/L (ref 135–145)

## 2011-01-19 LAB — URINALYSIS, ROUTINE W REFLEX MICROSCOPIC
Bilirubin Urine: NEGATIVE
Glucose, UA: NEGATIVE mg/dL
Hgb urine dipstick: NEGATIVE
Ketones, ur: NEGATIVE mg/dL
Nitrite: NEGATIVE
Protein, ur: NEGATIVE mg/dL
Specific Gravity, Urine: 1.02 (ref 1.005–1.030)
Urobilinogen, UA: 2 mg/dL — ABNORMAL HIGH (ref 0.0–1.0)
pH: 7 (ref 5.0–8.0)

## 2011-02-03 ENCOUNTER — Other Ambulatory Visit: Payer: Self-pay | Admitting: Internal Medicine

## 2011-02-03 NOTE — Telephone Encounter (Signed)
Ok RF 1 month and 2 RF No further RF w/o OV

## 2011-02-18 LAB — POCT CARDIAC MARKERS
CKMB, poc: <1 ng/mL — ABNORMAL LOW (ref 1.0–8.0)
Myoglobin, poc: 62.6 ng/mL (ref 12–200)
Troponin i, poc: <0.05 ng/mL (ref 0.00–0.09)

## 2011-03-09 ENCOUNTER — Encounter: Payer: Self-pay | Admitting: Pulmonary Disease

## 2011-03-11 ENCOUNTER — Ambulatory Visit (INDEPENDENT_AMBULATORY_CARE_PROVIDER_SITE_OTHER): Payer: BC Managed Care – PPO | Admitting: Pulmonary Disease

## 2011-03-11 ENCOUNTER — Encounter: Payer: Self-pay | Admitting: Pulmonary Disease

## 2011-03-11 VITALS — BP 122/80 | HR 75 | Temp 98.2°F | Ht 72.0 in | Wt 336.4 lb

## 2011-03-11 DIAGNOSIS — G4733 Obstructive sleep apnea (adult) (pediatric): Secondary | ICD-10-CM

## 2011-03-11 NOTE — Assessment & Plan Note (Signed)
The pt is doing well from an osa standpoint.  He is wearing cpap compliantly, and feels he is benefiting greatly from its use.  I have encouraged him to work aggressively on weight loss, and to f/u with me in one year.

## 2011-03-11 NOTE — Patient Instructions (Signed)
Continue with cpap, keep up with supplies and mask changes Work on weight loss followup with me in one year.  

## 2011-03-11 NOTE — Progress Notes (Signed)
  Subjective:    Patient ID: Cameron [name], male    DOB: 06-28-1946, 65 y.o.   MRN: [id number]  HPI The pt comes in today for f/u of his known severe osa.  He is wearing cpap compliantly, and is having no issues with mask fit or pressure.  He feels he sleeps well, and has excellent daytime alertness.  His weight is stable from the last visit.   Review of Systems  Constitutional: Negative for fever and unexpected weight change.  HENT: Positive for congestion. Negative for ear pain, nosebleeds, sore throat, rhinorrhea, sneezing, trouble swallowing, dental problem, postnasal drip and sinus pressure.   Eyes: Negative for redness and itching.  Respiratory: Positive for cough. Negative for chest tightness, shortness of breath and wheezing.   Cardiovascular: Negative for palpitations and leg swelling.  Gastrointestinal: Negative for nausea and vomiting.  Genitourinary: Negative for dysuria.  Musculoskeletal: Negative for joint swelling.  Skin: Negative for rash.  Neurological: Negative for headaches.  Hematological: Does not bruise/bleed easily.  Psychiatric/Behavioral: Negative for dysphoric mood. The patient is not nervous/anxious.        Objective:   Physical Exam Obese male in nad  No skin breakdown or pressure necrosis from cpap mask  Mild LE edema, no cyanosis Alert, appears awake, moves all 4        Assessment & Plan:

## 2011-03-26 NOTE — Op Note (Signed)
[facility]  Patient:    Cameron [name], Cameron [name] Visit Number: [id number] MRN: [id number]          Service Type: SUR Location: 3W [id number] 01 Attending Physician:  Caleen Essex Dictated by:   Ollen Gross. Vernell Morgans, M.D. Proc. Date: 05/30/02 Admit Date:  05/30/2002 Discharge Date: 05/31/2002                             Operative Report  PREOPERATIVE DIAGNOSIS:  Cholelithiasis.  POSTOPERATIVE DIAGNOSES:  Cholecystitis and cholelithiasis.  PROCEDURE:  Laparoscopic cholecystectomy.  SURGEON:  Ollen Gross. Vernell Morgans, M.D.  ASSISTANT:  Anselm Pancoast. Zachery Dakins, M.D.  ANESTHESIA:  General endotracheal.  DESCRIPTION OF PROCEDURE:  After informed consent was obtained, the patient was brought to the operating room, placed in the supine position on the operating table. After adequate induction of general anesthesia, the patients abdomen was prepped with Betadine and draped in the usual sterile manner. The area above the umbilicus was infiltrated with 0.25% Marcaine and a small supraumbilical incision was made with a 15 blade knife. This incision was carried down through the subcutaneous tissue with blunt dissection with the Kelly clamp and Army-Navy retractors until the linea alba was identified. The linea alba was incised with a 15 blade knife and each side was grasped Kocher clamps and elevated anteriorly. The preperitoneal space was then probed bluntly with a hemostat until the peritoneum was opened and access was gained to the abdominal cavity. A #0 Vicryl pursestring suture was placed in the fascia surrounding this opening, Hasson cannula was then placed through this opening into the abdominal cavity and anchored in place with the previously placed Vicryl pursestring stitch. The abdomen was then insufflated with carbon dioxide without difficulty. A laparoscope was placed through the Hasson cannula and the patient was placed in the head up position. The dome of  the gallbladder and liver edge were readily identifiable. A small transverse upper midline incision was then made after infiltrating this area with 0.25% Marcaine. A 10 mm port was then placed bluntly through this incision into the abdominal cavity under direct vision. Sites were then chosen laterally on the right side of the abdomen for placement of 5 mm ports and each of these areas was infiltrated with 0.25% Marcaine and small stab incisions were made with a 15 blade. The 5 mm ports were then placed bluntly through these incisions into the abdominal cavity under direct vision. Blunt graspers were placed through the lateral most 5 mm port and used to grasp the dome of the gallbladder and elevate it anteriorly and superiorly. The gallbladder appeared to be very inflamed and thickened and required drainage using a needle tip on the end of the nezhat dorsi aspirator. Once this had been accomplished, the gallbladder was somewhat more mobile and was able to be elevated anteriorly and superiorly. There were adhesions to the body of the gallbladder from the inflammation and these were taken down both sharply with the Bovie electrocautery as well as bluntly with dissection with the dissecting instrument. This was very difficult work because of the amount of inflammation. Once this had been accomplished, the lower body of the gallbladder was identified and was able to be grasped with another blunt grasper through the other 5 mm port. Blunt dissection was then carried out in the area of the gallbladder neck until the gallbladder neck cystic duct junction was readily identified. Dissection was carried out circumferentially until there  was a good window. Three clips were then placed proximally and one distally on the cystic duct and the duct was divided between the two. Posterior to this, the cystic artery was identified and again dissected bluntly in a circumferential fashion until a good window was  created. Two clips were placed proximally, one distally on the artery and the artery was divided between the two. Next, a laparoscopic hook was used to begin separating the gallbladder from the liver bed. This was very difficult work because of the amount of inflammation on the wall of the gallbladder. The laparoscopic spatula for the Bovie electrocautery was used instead as this seemed to work better. Several small holes in the gallbladder wall were made during this part of the dissection and several stones were spilled. This dissection was carried out until the gallbladder had been completely separated from the liver bed. Next, an endoscopic bag was placed through the upper midline port and the gallbladder was placed within the bag and the bag was sealed. The camera was then moved to the upper midline port and a gallbladder grasper was placed through the Hasson cannula and was used to grasp the bag with the gallbladder in it and the bag was removed through the supraumbilical port. The Hasson cannula was then replaced and the liver bed was inspected. There were several small bleeding points that required coagulation with the Bovie electrocautery. The area was then irrigated with copious amounts of saline until the affluent was clear and the stones had been cleared using the large suction aspirator. Once this was accomplished, a piece of Surgicel was placed in the liver bed. A blunt grasper was then placed through the lateral most 5 mm port and up through the upper midline port a 19 French round Blake drain was then grabbed with the grasper and brought into the abdomen and out through the lateral most 5 mm port. This drain was anchored in place with a 3-0 nylon stitch and the intra-abdominal portion of the drain was placed in the liver bed. The rest of the ports were then removed under direct vision. A second #0 Vicryl figure-of-eight stitch was required to reclose the supraumbilical  fascia. The ports were then all removed under direct vision and the skin incisions were closed with interrupted 4-0 Monocryl subcuticular stitches. Benzoin and Steri-Strips and sterile dressings were applied. The  patient tolerated the procedure well. At the end of the case, all needle, sponge and instrument counts were correct. The patient was awakened and taken to the recovery room in stable condition. Dictated by:   Ollen Gross. Vernell Morgans, M.D. Attending Physician:  Caleen Essex DD:  05/30/02 TD:  06/04/02 Job: [id number]

## 2011-03-26 NOTE — H&P (Signed)
[place]  Patient:    Cameron [name], Cameron [name] Visit Number: [id number] MRN: [id number]          Service Type: DSU Location: 3000 3012 [id number] Attending Physician:  Coletta Memos Dictated by:   Mena Goes. Franky Macho, M.D. Admit Date:  02/09/2002 Discharge Date: 02/11/2002                           History and Physical  ADMISSION DIAGNOSES: 1. Lumbar stenosis. 2. L4-5 lumbar herniated disk. 3. Lumbar radiculopathy.  HISTORY OF PRESENT ILLNESS:  Mr. 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  It got much worse after he spent seven hours of January 01, 2002, while driving.  The pain became so severe that he he was admitted to the hospital by Harvie Junior, M.D., and put on a prednisone dose pack.  He stayed in the hospital for approximately four to five days.  At that time, he had no problems with a foot drop, but he did have a stiff gait.  He had one brief episode.  We had trouble controlling his bowels, but he felt that was due to pain and he has not had problems since then.  He does have numbness and tingling in his right foot.  He feels pain minimally on the right side in the lower extremity, but the back pain is bilateral.  PAST MEDICAL HISTORY:  Positive for hypertension.  PAST SURGICAL HISTORY:  No previous operations.  ALLERGIES:  No known drug allergies.  FAMILY HISTORY:  His mother and father are 70 and 12 and are in good health.  SOCIAL HISTORY:  He does not smoke.  He does drink alcohol socially.  There is no history of illicit drug use.  REVIEW OF SYSTEMS:  Positive for eye glasses, hypertension, change in bowel habits, and back pain.  He denies constitution, ears, nose, throat, mouth, respiratory, genitourinary, skin, neurological, psychiatric, endocrine, hematologic, or allergic problems.  He has had numbness in the glans of  his pain since the pain became severe in February.  He had not been able to ejaculate nor had he had erections.  MEDICATIONS:  Currently he takes cyclobenzaprine, Roxicet, and hydrochlorothiazide.  PHYSICAL EXAMINATION:  Pulse 120.  HEIGHT:  He is 6 feet tall.  WEIGHTS:  He weighs 340 pounds.  NEUROLOGIC:  He is alert and oriented x 4 and answers all questions appropriately.  He is in mild distress.  He has an antalgic gait.  He has weakness in the right foot and difficulty with toe walking and heel walking on this side.  Manual exam 4+/5 plantar flexors and 4-/5 dorsiflexors on the right.  Proprioception and pinprick are intact.  There are 2+ reflexes at the biceps, triceps, brachial radialis, and knees.  No reflex elicited at the ankles.  No clonus elicited.  Toes are downgoing to plantar stimulation.  No Hoffmanns sign elicited.  NECK:  He has no cervical masses or bruits.  LUNGS:  The lung fields are clear.  HEART:  Regular rate and rhythm.  No murmurs or rubs.  Pulses good at the wrist and feet bilaterally.  EXTREMITIES:  He has normal muscle tone, bulk, and coordination.  Negative straight leg raising bilaterally.  LABORATORY DATA:  The MRI shows a severely stenotic level at L4-5 with probable herniated disk which has migrated rostrally behind the body  of L4. Difficult to discern a thecal sac and disk at this level on MRI.  He is also stenotic at L5-S1 with facet arthropathy at L4-5 on the right side greater than the left.  Clonus is normal in appearance.  No other disks are appreciated.  Alignment is normal.  I believe that Mr. Lykins would best be served by having a decompression of the lumbar spine.  He underwent lumbar epidural injections and although they have helped, they have not helped him enough for him to not go ahead with the procedure.  I do not see any reason for fusion as there is no inherent instability and he does not give a history of severe back  pain.  I think that he can be treated adequately with diskectomy and lumbar decompression.  The risks of the procedure, including bleeding, infection, no pain relief, bowel or bladder dysfunction, and need for further surgery were discussed.  He understands and wishes to proceed. Dictated by:   Mena Goes. Franky Macho, M.D. Attending Physician:  Coletta Memos DD:  02/09/02 TD:  02/09/02 Job: [id number]

## 2011-03-26 NOTE — Op Note (Signed)
[place]  Patient:    Cameron [name], Cameron [name] Visit Number: [id number] MRN: [id number]          Service Type: DSU Location: 3000 3012 [id number] Attending Physician:  Coletta Memos Dictated by:   Mena Goes. Franky Macho, M.D. Proc. Date: 02/07/02 Admit Date:  02/09/2002 Discharge Date: 02/11/2002                             Operative Report  PREOPERATIVE DIAGNOSES:  Lumbar stenosis at L4-5 and L5-S1, and lumbar radiculopathy.  POSTOPERATIVE DIAGNOSES:  Lumbar stenosis at L4-5 and L5-S1, and lumbar radiculopathy.  PROCEDURE:  Lumbar laminectomy at L5, hemilaminectomy at L4, hemilaminectomy at S1 with microdissection.  COMPLICATIONS:  None.  INDICATIONS:  Cameron [name] is a 65 year old gentleman who presented with long history of pain in both lower extremities and a foot drop on the right side.  I recommended, and he agreed, to undergo operative decompression after conservative treatments (including epidural steroids) did not work.  OPERATIVE NOTE:  Cameron [name] was brought to the operating room, intubated and placed under general anesthesia without difficulty.  He was laid prone on a Wilson frame, and all pressure points were properly padded.  His skin was prepped and he was draped in a sterile fashion.  I had taken a preoperative x-ray for localization.  I infiltrated 20 cc of 0.5% lidocaine 1:200,000 strength epinephrine into the lumbar area and paraspinous musculature.  I then made an incision with a #10 blade and took this down through the thoracolumbar fascia, using monopolar cautery to control bleeding in the subcutaneous tissues.  I exposed the lamina at the S1, L5 and L4 bilaterally.  I applied self-retaining retractor.  I then took an  x-ray to confirm that I was at the correct space, and I was.  I then removed the lamina of L5 using Kerrison punch, Lexsell rongeur and a high-speed drill.  I also performed a hemilaminectomy of S1 and then a  hemilaminectomy of L4.  I decompressed using microdissection, along with Dr. Jeral Fruit, the L5 nerve roots and L4 nerve roots. I ensured that there was adequate room for the nerve roots bilaterally.  After decompression, I explored the foramen at L4 bilaterally and at L5 bilaterally; felt that they were decompressed.  I did not feel that diskectomy was warranted at either L4 or L5.  Certainly there were oseophytic bulges present there, but I did not see the utility of performing diskectomy at those levels. I then irrigated the wound. I then closed the wound in a layered fashion using Vicryl sutures.  Subcutaneous tissues were reapproximated with Vicryl. Dermabond used for a sterile dressing. Dictated by:   Mena Goes. Franky Macho, M.D. Attending Physician:  Coletta Memos DD:  02/09/02 TD:  02/11/02 Job: [id number]

## 2011-03-26 NOTE — Assessment & Plan Note (Signed)
[facility]                                 ON-CALL NOTE   NAME:SHARPLESSKhamarion, [name]                     MRN:          [id number]  DATE:05/20/2007                            DOB:          Apr 02, 1946    PRIMARY CARE PHYSICIAN:  Willow Ora, M.D.   [name] is a 65 year old gentleman who reports that about 10:30  this morning he started having lower abdominal pain.  He rates it a 5  out of 10.  He states it is very similar to the pain he had when he had  gallbladder disease.  He states that he has fecal urgency as well as  urinary urgency.  He denies any fevers or chills.  He denies any nausea  or vomiting.  He took some baking soda earlier today but no significant  improvement.  Again, he rates the pain 5 out of 10.   PLAN:  I did advise the patient that he needs to be seen.  If the pain  is not significant in regards to severity, he could be seen at a local  urgent care in [facility]  or the emergency department if the pain is  moderate.  The patient expressed understanding and will seek medical  care.     Leanne Chang, M.D.  Electronically Signed    LA/MedQ  DD: 05/20/2007  DT: 05/21/2007  Job #: [id number]

## 2011-04-09 ENCOUNTER — Encounter: Payer: Self-pay | Admitting: Internal Medicine

## 2011-04-09 ENCOUNTER — Ambulatory Visit (INDEPENDENT_AMBULATORY_CARE_PROVIDER_SITE_OTHER): Payer: BC Managed Care – PPO | Admitting: Internal Medicine

## 2011-04-09 VITALS — BP 132/86 | HR 96 | Wt 336.0 lb

## 2011-04-09 DIAGNOSIS — N410 Acute prostatitis: Secondary | ICD-10-CM | POA: Insufficient documentation

## 2011-04-09 DIAGNOSIS — N419 Inflammatory disease of prostate, unspecified: Secondary | ICD-10-CM

## 2011-04-09 DIAGNOSIS — D509 Iron deficiency anemia, unspecified: Secondary | ICD-10-CM

## 2011-04-09 DIAGNOSIS — N390 Urinary tract infection, site not specified: Secondary | ICD-10-CM

## 2011-04-09 DIAGNOSIS — I10 Essential (primary) hypertension: Secondary | ICD-10-CM

## 2011-04-09 HISTORY — DX: Acute prostatitis: N41.0

## 2011-04-09 LAB — POCT URINALYSIS DIPSTICK
Bilirubin, UA: NEGATIVE
Glucose, UA: NEGATIVE
Ketones, UA: NEGATIVE
Leukocyte Esterase: NEGATIVE
Nitrite, UA: NEGATIVE
Protein, UA: NEGATIVE
Spec Grav, UA: 1.02
Urobilinogen, UA: 0.2
pH, UA: 7

## 2011-04-09 MED ORDER — POTASSIUM CHLORIDE 10 MEQ PO TBCR: 10.0000 meq | EXTENDED_RELEASE_TABLET | Freq: Every day | ORAL | Status: DC

## 2011-04-09 MED ORDER — CIPROFLOXACIN HCL 500 MG PO TABS: 500.0000 mg | ORAL_TABLET | Freq: Two times a day (BID) | ORAL | Status: AC

## 2011-04-09 NOTE — Assessment & Plan Note (Addendum)
Recently admitted with a UTI and severe prostatitis. Urinary symptoms better, he was prescribed Terazosin but he is urinating well. I actually recommend ciprofloxacin for full 14 days (they only recommended about 10 days, a new prescription will be issued today) He will follow up with the urologist as recommended.

## 2011-04-09 NOTE — Progress Notes (Signed)
  Subjective:    Patient ID: Cameron [name], male    DOB: 01-16-1946, 65 y.o.   MRN: [id number]  HPI Hospital followup, admitted from 5/29 to 5-31. He was diagnosed with prostatitis, rhabdomyolysis, dehydration, hypokalemia, UTI, LFTs were elevated thought to be due to dehydration. Labs are reviewed: Initial white count 15.4, and discharge white count 6.5.  Hemoglobin initially was 13.3, at the time of discharge was 11.3. Folic acid normal, B12 225, iron 19 which is low.  Platelets around 140. Last potassium was 3.4, creatinine 0.8. Calcium is slightly low at 7.9. Hemoglobin A1c 6.1. LFTs initially 53 and 78, later on 47 and 56. Total cholesterol 114. LDL 72 CK initially 360, later on 223 PSA was 56, urine culture show Escherichia coli, pansensitive, renal ultrasound negative. Was rec to hold BP meds and take terazosin.   He was discharged on  Iron  and ciprofloxacin for 6 additional days.  He was recommended to followup with urology.  Past Medical History  Diagnosis Date  . HTN (hypertension)   . Diverticulosis of colon   . Osteopenia   . Sleep apnea    Past Surgical History  Procedure Date  . Cholecystectomy 2003  . Back surgery 2003     Review of Systems Since he left the hospital he is feeling slightly better. Still weak but the urinary symptoms have resolved. No dysuria frequency or difficulty urinating. No fevers, no nausea vomiting or diarrhea. He was found to have anemia, he denies any blood in the stools in the last few months. No abdominal pain, GERD symptoms or heartburn. He has stopped all his previous BP meds but has not started  terazosin     Objective:   Physical Exam  Constitutional: He appears well-developed. No distress.  HENT:  Head: Normocephalic and atraumatic.  Cardiovascular: Normal rate, regular rhythm and normal heart sounds.   No murmur heard. Pulmonary/Chest: Effort normal and breath sounds normal. No respiratory distress. He has no  wheezes. He has no rales.  Abdominal: Soft. He exhibits no distension. There is no tenderness. There is no rebound and no guarding.  Musculoskeletal: He exhibits no edema.  Skin: He is not diaphoretic.          Assessment & Plan:

## 2011-04-09 NOTE — Assessment & Plan Note (Addendum)
Profound iron deficiency, see history of present illness. Last colonoscopy was April 2011, was recommended to repeat it in 5 years. I'm not sure why he has iron deficiency, GI review of systems negative. Plan:   re-refer to GI, start iron

## 2011-04-09 NOTE — Assessment & Plan Note (Signed)
He has actually not taking his regular blood pressure medication today but has not started the new medication terazosin either. BP today is okay. Plan: Metoprolol as before For now, hold HCTZ  Take half of a potassium tablet. Check blood pressures, if they're elevated he will call me. Followup in 10 days

## 2011-04-09 NOTE — Patient Instructions (Signed)
Take a total of 14 days of antibiotics. Check the  blood pressure 2 or 3 times a week, be sure it is less than 140/85. If it is consistently higher, let me know No hydrochlorothiazide take a lower dose of potassium Come back in 2 weeks

## 2011-04-12 ENCOUNTER — Encounter: Payer: Self-pay | Admitting: Gastroenterology

## 2011-04-12 ENCOUNTER — Telehealth: Payer: Self-pay | Admitting: Gastroenterology

## 2011-04-12 NOTE — Telephone Encounter (Signed)
Patient rescheduled to [facility] 04/16/11 10:00.  Luster Landsberg will notify the patient

## 2011-04-14 ENCOUNTER — Encounter: Payer: Self-pay | Admitting: *Deleted

## 2011-04-16 ENCOUNTER — Ambulatory Visit (INDEPENDENT_AMBULATORY_CARE_PROVIDER_SITE_OTHER): Payer: BC Managed Care – PPO | Admitting: Gastroenterology

## 2011-04-16 ENCOUNTER — Encounter: Payer: Self-pay | Admitting: Gastroenterology

## 2011-04-16 ENCOUNTER — Other Ambulatory Visit (INDEPENDENT_AMBULATORY_CARE_PROVIDER_SITE_OTHER): Payer: BC Managed Care – PPO

## 2011-04-16 DIAGNOSIS — K760 Fatty (change of) liver, not elsewhere classified: Secondary | ICD-10-CM

## 2011-04-16 DIAGNOSIS — D509 Iron deficiency anemia, unspecified: Secondary | ICD-10-CM

## 2011-04-16 DIAGNOSIS — K219 Gastro-esophageal reflux disease without esophagitis: Secondary | ICD-10-CM

## 2011-04-16 DIAGNOSIS — E669 Obesity, unspecified: Secondary | ICD-10-CM

## 2011-04-16 DIAGNOSIS — K7689 Other specified diseases of liver: Secondary | ICD-10-CM

## 2011-04-16 DIAGNOSIS — N411 Chronic prostatitis: Secondary | ICD-10-CM

## 2011-04-16 HISTORY — DX: Gastro-esophageal reflux disease without esophagitis: K21.9

## 2011-04-16 LAB — AMMONIA: Ammonia: 17 umol/L (ref 11–35)

## 2011-04-16 LAB — PROTIME-INR
INR: 1.2 ratio — ABNORMAL HIGH (ref 0.8–1.0)
Prothrombin Time: 13.4 s — ABNORMAL HIGH (ref 10.2–12.4)

## 2011-04-16 MED ORDER — CYANOCOBALAMIN 1000 MCG/ML IJ SOLN
1000.0000 ug | INTRAMUSCULAR | Status: DC
  Administered 2011-04-16 – 2011-04-23 (×2): 1000 ug via INTRAMUSCULAR

## 2011-04-16 MED ORDER — ESOMEPRAZOLE MAGNESIUM 40 MG PO CPDR: 40.0000 mg | DELAYED_RELEASE_CAPSULE | Freq: Every day | ORAL | Status: DC

## 2011-04-16 MED ORDER — PEG-KCL-NACL-NASULF-NA ASC-C 100 G PO SOLR: 1.0000 | Freq: Once | ORAL | Status: DC

## 2011-04-16 NOTE — Patient Instructions (Addendum)
Please go to the basement today for your labs.  Your prescription(s) have been sent to you pharmacy.  Your procedure has been scheduled for 05/03/2011, please follow the seperate instructions.  Your abdominal ultrasound is scheduled for 04/19/2011, please arrive at [facility] 7:45am for 8:00am do not have anything to eat or drink after midnight.  Today we gave you your first b12 injection on 3 weekly you can either get the next two weekly injection at our office or Dr Leta Jungling. After your three weekly injections you will need them monthly.  Take Nexium once tablet about 30 min before breakfast.

## 2011-04-16 NOTE — Progress Notes (Signed)
History of Present Illness:  This is a somewhat complicated 65 year old African American male recently hospitalized with urinary sepsis from probable prostatitis. He continues on oral Cipro apparently for one month. There is hospitalization he was found to have iron deficiency anemia with a 9% iron saturation, low B12 level of 220, and normal folate level. Patient relates that his stools were checked and were occult blood negative. I've seen him for previous colonoscopies which have been unremarkable , last performed one year ago. The patient really denies GI symptoms except for occasional acid reflux without associated dysphagia. He specifically denies melena or hematochezia, history of hepatitis or pancreatitis. His liver function tests were mildly abnormal he had a low serum albumin level. He is status post cholecystectomy. History is positive for chronic obesity essential hypertension. Patient also has rather severe sleep apnea and is on a CPAP machine. Family history is remarkable for colon cancer his grandfather and an uncle. One of his aunts had breast cancer. The patient denies abuse of alcohol, cigarettes, or NSAIDs.  I have reviewed this patient's present history, medical and surgical past history, allergies and medications.     ROS: The remainder of the 10 point ROS is negative.. no history of mental status changes, slight disease, peripheral edema, arthritis or any symptoms of collagen vascular disease. The patient follows a fairly regular diet and denies any specific food intolerances. Currently it is all potassium and iron replacement and aspirin 81 mg a day.  Past Medical History  Diagnosis Date  . HTN (hypertension)   . Diverticulosis of colon   . Osteopenia   . Sleep apnea   . Personal history of colonic polyps 2002    TUBULAR ADENOMA  . Family history of malignant neoplasm of gastrointestinal tract   . Iron deficiency anemia    Past Surgical History  Procedure Date  .  Cholecystectomy 2003  . Back surgery 2003    reports that he has never smoked. He does not have any smokeless tobacco history on file. He reports that he drinks alcohol. His drug history not on file. family history includes Colon cancer in his maternal uncle and unspecified family member and Hypertension in his father.  There is no history of Prostate cancer and Heart attack. Allergies  Allergen Reactions  . Clindamycin     REACTION: rash        Physical Exam: General well developed well nourished patient in no acute distress, appearing his stated age. Morbid obesity noted Eyes PERRLA, no icterus, fundoscopic exam per opthamologist Skin no lesions noted Neck supple, no adenopathy, no thyroid enlargement, no tenderness Chest clear to percussion and auscultation Heart no significant murmurs, gallops or rubs noted Abdomen no hepatosplenomegaly masses or tenderness, BS normal.  Extremities no acute joint lesions, edema, phlebitis or evidence of cellulitis. Neurologic patient oriented x 3, cranial nerves intact, no focal neurologic deficits noted. No asterixis noted Psychological mental status normal and normal affect.  Assessment and plan: Iron deficiency anemia of unexplained etiology. The patient's wife is present throughout his exam and to review. From her I gather that he has had more acid reflux symptoms and he relates. I've scheduled followup colonoscopy, endoscopy, and is scheduled ultrasound of the liver. I suspect he has fatty liver and may have some early cirrhosis . Review of his lab does show a depressed serum albumin level and an elevated PT. Hepatitis B surface antigen and hepatitis C  Antibody ordered, ammonia level, and I have started him on AcipHex 20  mg a day empirically. Also celiac antibodies ordered. This patient may need percutaneous liver biopsy depending on his work up. The cause of his resume her sleep apnea we will do his procedures with propofol sedation.   Please  copy her primary care physician, referring physician, and pertinent subspecialists. Please copy her primary care physician, referring physician, and pertinent subspecialists.  Encounter Diagnoses  Name Primary?  . Fatty liver   . Esophageal reflux   . Iron deficiency anemia, unspecified   . Obesity

## 2011-04-17 LAB — HEPATITIS B SURFACE ANTIGEN: Hepatitis B Surface Ag: NEGATIVE

## 2011-04-17 LAB — HEPATITIS C ANTIBODY: HCV Ab: NEGATIVE

## 2011-04-19 ENCOUNTER — Ambulatory Visit (HOSPITAL_COMMUNITY)
Admission: RE | Admit: 2011-04-19 | Discharge: 2011-04-19 | Disposition: A | Discharge status: Home / Self Care | Payer: BC Managed Care – PPO | Source: Ambulatory Visit | Attending: Gastroenterology | Admitting: Gastroenterology

## 2011-04-19 DIAGNOSIS — R7989 Other specified abnormal findings of blood chemistry: Secondary | ICD-10-CM | POA: Insufficient documentation

## 2011-04-19 DIAGNOSIS — E669 Obesity, unspecified: Secondary | ICD-10-CM

## 2011-04-19 DIAGNOSIS — K7689 Other specified diseases of liver: Secondary | ICD-10-CM | POA: Insufficient documentation

## 2011-04-19 DIAGNOSIS — Z9089 Acquired absence of other organs: Secondary | ICD-10-CM | POA: Insufficient documentation

## 2011-04-19 DIAGNOSIS — D509 Iron deficiency anemia, unspecified: Secondary | ICD-10-CM

## 2011-04-19 DIAGNOSIS — K219 Gastro-esophageal reflux disease without esophagitis: Secondary | ICD-10-CM

## 2011-04-19 DIAGNOSIS — K760 Fatty (change of) liver, not elsewhere classified: Secondary | ICD-10-CM

## 2011-04-19 LAB — CELIAC PANEL 10
Endomysial Screen: NEGATIVE
Gliadin IgA: 15.3 U/mL (ref <20)
Gliadin IgG: 7 U/mL (ref <20)
IgA: 168 mg/dL (ref 68–379)
Tissue Transglut Ab: 6.6 U/mL (ref <20)
Tissue Transglutaminase Ab, IgA: 4.5 U/mL (ref <20)

## 2011-04-23 ENCOUNTER — Encounter: Payer: Self-pay | Admitting: Internal Medicine

## 2011-04-23 ENCOUNTER — Telehealth: Payer: Self-pay | Admitting: Gastroenterology

## 2011-04-23 ENCOUNTER — Ambulatory Visit (INDEPENDENT_AMBULATORY_CARE_PROVIDER_SITE_OTHER): Payer: BC Managed Care – PPO | Admitting: Internal Medicine

## 2011-04-23 DIAGNOSIS — N419 Inflammatory disease of prostate, unspecified: Secondary | ICD-10-CM

## 2011-04-23 DIAGNOSIS — D509 Iron deficiency anemia, unspecified: Secondary | ICD-10-CM

## 2011-04-23 DIAGNOSIS — I10 Essential (primary) hypertension: Secondary | ICD-10-CM

## 2011-04-23 DIAGNOSIS — K7689 Other specified diseases of liver: Secondary | ICD-10-CM

## 2011-04-23 DIAGNOSIS — K219 Gastro-esophageal reflux disease without esophagitis: Secondary | ICD-10-CM

## 2011-04-23 DIAGNOSIS — E669 Obesity, unspecified: Secondary | ICD-10-CM

## 2011-04-23 LAB — HEMOGLOBIN: Hemoglobin: 12.6 g/dL — ABNORMAL LOW (ref 13.0–17.0)

## 2011-04-23 LAB — POTASSIUM: Potassium: 4.2 mEq/L (ref 3.5–5.1)

## 2011-04-23 MED ORDER — CYANOCOBALAMIN 1000 MCG/ML IJ SOLN: 1000.0000 ug | Freq: Once | INTRAMUSCULAR | Status: DC

## 2011-04-23 NOTE — Assessment & Plan Note (Addendum)
undergoin a w/u by GI. Played tennis few days ago w/o CP or SOB Will recheck a Hg,  if < 10 or 11 , will rec not to play. Definitely tell me if problems while exercising

## 2011-04-23 NOTE — Assessment & Plan Note (Signed)
asx , was told to take 30 days of cipro per urology while at the hospital Out pt f/u pending, rec to call and make appointment

## 2011-04-23 NOTE — Assessment & Plan Note (Signed)
BP well controlled on BB only, no change Recent low K at the hospital: recheck, d/c KCL if normal

## 2011-04-23 NOTE — Progress Notes (Signed)
  Subjective:    Patient ID: Cameron [name], male    DOB: Mar 03, 1946, 65 y.o.   MRN: [id number]  HPI  Here for f/u, feels great  Past Medical History  Diagnosis Date  . HTN (hypertension)   . Diverticulosis of colon   . Osteopenia   . Sleep apnea   . Personal history of colonic polyps 2002    TUBULAR ADENOMA  . Family history of malignant neoplasm of gastrointestinal tract   . Iron deficiency anemia    Past Surgical History  Procedure Date  . Cholecystectomy 2003  . Back surgery 2003   History   Social History Narrative   Diet - no recent changes----Exercise - just started a walking program, plays mix [employer] doubles    Review of Systems  Cardiovascular:       Amb BP 120-130/89  Genitourinary: Negative for dysuria, hematuria and difficulty urinating.       Good compliance w/  cipro , was rec to take x 30 days, has not seen urologu yet  Hematological:       Anemia w/u in progress on B12  Shots. Likes to play tennis, ok?      Objective:   Physical Exam  Constitutional: He is oriented to person, place, and time. He appears well-developed. No distress.  Cardiovascular: Normal rate, regular rhythm and normal heart sounds.   No murmur heard. Pulmonary/Chest: Effort normal. No respiratory distress. He has no wheezes. He has no rales. He exhibits no tenderness.  Musculoskeletal: He exhibits no edema.  Neurological: He is alert and oriented to person, place, and time.  Skin: He is not diaphoretic.  Psychiatric: He has a normal mood and affect. His behavior is normal. Judgment and thought content normal.          Assessment & Plan:

## 2011-04-30 ENCOUNTER — Ambulatory Visit (INDEPENDENT_AMBULATORY_CARE_PROVIDER_SITE_OTHER): Payer: BC Managed Care – PPO | Admitting: *Deleted

## 2011-04-30 DIAGNOSIS — E538 Deficiency of other specified B group vitamins: Secondary | ICD-10-CM

## 2011-04-30 MED ORDER — CYANOCOBALAMIN 1000 MCG/ML IJ SOLN
1000.0000 ug | Freq: Once | INTRAMUSCULAR | Status: AC
  Administered 2011-04-30: 1000 ug via INTRAMUSCULAR

## 2011-05-03 ENCOUNTER — Ambulatory Visit (AMBULATORY_SURGERY_CENTER): Payer: BC Managed Care – PPO | Admitting: Gastroenterology

## 2011-05-03 ENCOUNTER — Encounter: Payer: Self-pay | Admitting: Gastroenterology

## 2011-05-03 DIAGNOSIS — D126 Benign neoplasm of colon, unspecified: Secondary | ICD-10-CM

## 2011-05-03 DIAGNOSIS — K2960 Other gastritis without bleeding: Secondary | ICD-10-CM

## 2011-05-03 DIAGNOSIS — D509 Iron deficiency anemia, unspecified: Secondary | ICD-10-CM

## 2011-05-03 DIAGNOSIS — K219 Gastro-esophageal reflux disease without esophagitis: Secondary | ICD-10-CM

## 2011-05-03 DIAGNOSIS — Z86010 Personal history of colonic polyps: Secondary | ICD-10-CM

## 2011-05-03 DIAGNOSIS — D649 Anemia, unspecified: Secondary | ICD-10-CM

## 2011-05-03 DIAGNOSIS — K7689 Other specified diseases of liver: Secondary | ICD-10-CM

## 2011-05-03 DIAGNOSIS — K2940 Chronic atrophic gastritis without bleeding: Secondary | ICD-10-CM

## 2011-05-03 MED ORDER — SODIUM CHLORIDE 0.9 % IV SOLN: 500.0000 mL | INTRAVENOUS | Status: DC

## 2011-05-03 NOTE — Telephone Encounter (Addendum)
Pt coming in today for COLON>

## 2011-05-03 NOTE — Patient Instructions (Signed)
Discharge instructions given with verbal understanding. Handouts on polyps and diverticulosis given. High fiber diet. Resume previous medications. 

## 2011-05-04 ENCOUNTER — Telehealth: Payer: Self-pay | Admitting: *Deleted

## 2011-05-04 NOTE — Telephone Encounter (Signed)

## 2011-05-05 DIAGNOSIS — K219 Gastro-esophageal reflux disease without esophagitis: Secondary | ICD-10-CM

## 2011-05-05 LAB — HELICOBACTER PYLORI SCREEN-BIOPSY: UREASE: NEGATIVE

## 2011-05-07 ENCOUNTER — Encounter: Payer: Self-pay | Admitting: Gastroenterology

## 2011-05-08 ENCOUNTER — Other Ambulatory Visit: Payer: Self-pay | Admitting: Internal Medicine

## 2011-05-25 ENCOUNTER — Ambulatory Visit: Payer: BC Managed Care – PPO | Admitting: Gastroenterology

## 2011-06-03 ENCOUNTER — Ambulatory Visit (INDEPENDENT_AMBULATORY_CARE_PROVIDER_SITE_OTHER): Payer: BC Managed Care – PPO | Admitting: *Deleted

## 2011-06-03 DIAGNOSIS — E538 Deficiency of other specified B group vitamins: Secondary | ICD-10-CM

## 2011-06-03 MED ORDER — CYANOCOBALAMIN 1000 MCG/ML IJ SOLN
1000.0000 ug | Freq: Once | INTRAMUSCULAR | Status: AC
  Administered 2011-06-03: 1000 ug via INTRAMUSCULAR

## 2011-07-20 ENCOUNTER — Ambulatory Visit (INDEPENDENT_AMBULATORY_CARE_PROVIDER_SITE_OTHER): Payer: BC Managed Care – PPO | Admitting: *Deleted

## 2011-07-20 DIAGNOSIS — E538 Deficiency of other specified B group vitamins: Secondary | ICD-10-CM

## 2011-07-20 MED ORDER — CYANOCOBALAMIN 1000 MCG/ML IJ SOLN
1000.0000 ug | Freq: Once | INTRAMUSCULAR | Status: AC
  Administered 2011-07-20: 1000 ug via INTRAMUSCULAR

## 2011-08-17 ENCOUNTER — Ambulatory Visit: Payer: BC Managed Care – PPO

## 2011-08-19 ENCOUNTER — Ambulatory Visit: Payer: BC Managed Care – PPO

## 2011-09-01 ENCOUNTER — Ambulatory Visit: Payer: BC Managed Care – PPO

## 2011-09-06 ENCOUNTER — Telehealth: Payer: Self-pay

## 2011-09-06 MED ORDER — METOPROLOL SUCCINATE ER 25 MG PO TB24: 25.0000 mg | ORAL_TABLET | Freq: Every day | ORAL | Status: DC

## 2011-09-06 NOTE — Telephone Encounter (Signed)
Addended by: Candie Echevaria L on: 09/06/2011 12:28 PM   Modules accepted: Orders

## 2011-09-06 NOTE — Telephone Encounter (Signed)
Rx sent to wrong pharmacy. Rx re-sent to correct pharmacy

## 2011-09-06 NOTE — Telephone Encounter (Signed)
Pt's wife requesting 3 day supply of toprol  Done

## 2011-09-13 MED ORDER — METOPROLOL SUCCINATE ER 25 MG PO TB24: 12.5000 mg | ORAL_TABLET | Freq: Every day | ORAL | Status: DC

## 2011-09-13 NOTE — Telephone Encounter (Signed)
Pharmacy call to clarify med instruction change. Pharmacy advise sig should be for 1/2 tab qd not 1 tab. Rx verbally given over the phone.

## 2011-09-13 NOTE — Telephone Encounter (Signed)
Addended by: Candie Echevaria L on: 09/13/2011 12:30 PM   Modules accepted: Orders

## 2011-10-29 ENCOUNTER — Ambulatory Visit: Payer: BC Managed Care – PPO

## 2011-11-05 ENCOUNTER — Ambulatory Visit (INDEPENDENT_AMBULATORY_CARE_PROVIDER_SITE_OTHER): Payer: BC Managed Care – PPO | Admitting: *Deleted

## 2011-11-05 DIAGNOSIS — D509 Iron deficiency anemia, unspecified: Secondary | ICD-10-CM

## 2011-11-05 MED ORDER — CYANOCOBALAMIN 1000 MCG/ML IJ SOLN
1000.0000 ug | Freq: Once | INTRAMUSCULAR | Status: AC
  Administered 2011-11-05: 1000 ug via INTRAMUSCULAR

## 2011-11-27 ENCOUNTER — Ambulatory Visit: Payer: BC Managed Care – PPO | Admitting: Family Medicine

## 2011-11-29 ENCOUNTER — Ambulatory Visit (INDEPENDENT_AMBULATORY_CARE_PROVIDER_SITE_OTHER): Payer: BC Managed Care – PPO | Admitting: Internal Medicine

## 2011-11-29 ENCOUNTER — Encounter: Payer: Self-pay | Admitting: Internal Medicine

## 2011-11-29 ENCOUNTER — Ambulatory Visit (INDEPENDENT_AMBULATORY_CARE_PROVIDER_SITE_OTHER)
Admission: RE | Admit: 2011-11-29 | Discharge: 2011-11-29 | Disposition: A | Discharge status: Home / Self Care | Payer: BC Managed Care – PPO | Source: Ambulatory Visit | Attending: Internal Medicine | Admitting: Internal Medicine

## 2011-11-29 ENCOUNTER — Telehealth: Payer: Self-pay | Admitting: *Deleted

## 2011-11-29 VITALS — BP 128/80 | HR 102 | Temp 98.0°F | Resp 18 | Wt 319.0 lb

## 2011-11-29 DIAGNOSIS — R05 Cough: Secondary | ICD-10-CM

## 2011-11-29 DIAGNOSIS — K5289 Other specified noninfective gastroenteritis and colitis: Secondary | ICD-10-CM

## 2011-11-29 DIAGNOSIS — R059 Cough, unspecified: Secondary | ICD-10-CM

## 2011-11-29 DIAGNOSIS — K529 Noninfective gastroenteritis and colitis, unspecified: Secondary | ICD-10-CM

## 2011-11-29 MED ORDER — ONDANSETRON HCL 4 MG PO TABS: 4.0000 mg | ORAL_TABLET | Freq: Three times a day (TID) | ORAL | Status: AC | PRN

## 2011-11-29 MED ORDER — FLUTICASONE PROPIONATE 50 MCG/ACT NA SUSP: 2.0000 | Freq: Every day | NASAL | Status: DC

## 2011-11-29 NOTE — Progress Notes (Signed)
  Subjective:    Patient ID: Cameron [name], male    DOB: 07-11-1946, 66 y.o.   MRN: [id number]  HPI Acute visit, here with his wife. 3 days history of on and off watery diarrhea, nausea, vomiting and dry heaves. Has not taken any medications, 2 family members are affected with nausea and vomiting, another family member with nausea, vomiting and diarrhea.  Also complains of cough for several months, usually dry. Medication list is reviewed, several changes made   Past Medical History: Hypertension Osteopenia DEXA 12-2006 Sleep apnea History of iron deficiency anemia GI: --These are to call us his --Colon polyps --Fatty liver ~ 04-2011 admitted to [facility] w/ prostatitis  Past Surgical History: Cholecystectomy 2003 back surgery 2003   Review of Systems Denies any fever or chills No dominant pains or cramps, no blood in the stools or hematemesis. Admits to some allergies and postnasal dripping. Denies any heartburn Wife reports occasional wheezing    Objective:   Physical Exam  Constitutional: He is oriented to person, place, and time. He appears well-developed and well-nourished.  HENT:  Head: Normocephalic and atraumatic.  Nose: Nose normal.  Cardiovascular: Normal rate and regular rhythm.   No murmur heard. Pulmonary/Chest: Effort normal and breath sounds normal. No respiratory distress. He has no wheezes. He has no rales.  Abdominal: Soft. He exhibits no distension. There is no tenderness. There is no rebound and no guarding.       Bowel sounds increased  Musculoskeletal: He exhibits no edema.  Neurological: He is alert and oriented to person, place, and time.  Psychiatric: He has a normal mood and affect. His behavior is normal. Judgment and thought content normal.       Assessment & Plan:  Acute gastroenteritis:  See instructions  Cough: Dry cough going on for a while, patient reports postnasal dripping sometimes. Plan: Nasal steroids, chest x-ray, at  some point he was taking PPIs recommend to restart.  Followup in one month for a CPX,

## 2011-11-29 NOTE — Telephone Encounter (Signed)
Scheduled OV 2:00pm 01.21.13/SLS

## 2011-11-29 NOTE — Patient Instructions (Signed)
Rest, drink plenty of clear fluids, use Zofran as needed for nausea. Take Pepto-Bismol for diarrhea Call if not better in few  days. ------------------------------------ As far as the cough: Get a chest x-ray Start Flonase 2 sprays in each side of the nose Start Prilosec 20 mg over-the-counter, one tablet before breakfast even if you don't l have heartburn. ----------------------------------- Come back in a month, fasting for a physical exam

## 2011-11-29 NOTE — Telephone Encounter (Signed)
Triage Call Report Triage Record Num: [id number] Operator: Albertine Grates Patient Name: Cameron [name] Call Date & Time: 11/26/2011 5:34:12PM Patient Phone: [phone] PCP: Nolon Rod. Paz Patient Gender: Male PCP Fax : Patient DOB: [birth year] Practice Name: [facility] Reason for Call: Caller: Adrieanne/Sibling; PCP: Willow Ora; CB#: [phone]; Call regarding Vomiting; Daughter/Adrianne calling and states has spoken to office 1-18 and was told needs to see MD 1-19. Was also given advise on what to do for him until can be seen. Was told to call and schedule when could speak freely as was not at home. Advised call [facility] office 1-19. Protocol(s) Used: Office Note Recommended Outcome per Protocol: Information Noted and Sent to Office Reason for Outcome: Caller information to office Care Advice: ~ 01/

## 2011-11-29 NOTE — Telephone Encounter (Signed)
Left message to call office

## 2011-11-30 ENCOUNTER — Encounter: Payer: Self-pay | Admitting: Internal Medicine

## 2011-12-06 ENCOUNTER — Ambulatory Visit (INDEPENDENT_AMBULATORY_CARE_PROVIDER_SITE_OTHER): Payer: BC Managed Care – PPO | Admitting: *Deleted

## 2011-12-06 DIAGNOSIS — E538 Deficiency of other specified B group vitamins: Secondary | ICD-10-CM

## 2011-12-06 MED ORDER — CYANOCOBALAMIN 1000 MCG/ML IJ SOLN
1000.0000 ug | Freq: Once | INTRAMUSCULAR | Status: AC
  Administered 2011-12-06: 1000 ug via INTRAMUSCULAR

## 2012-01-24 ENCOUNTER — Ambulatory Visit (INDEPENDENT_AMBULATORY_CARE_PROVIDER_SITE_OTHER): Payer: BC Managed Care – PPO | Admitting: Internal Medicine

## 2012-01-24 VITALS — BP 146/88 | HR 67 | Temp 98.2°F | Wt 333.0 lb

## 2012-01-24 DIAGNOSIS — R059 Cough, unspecified: Secondary | ICD-10-CM

## 2012-01-24 DIAGNOSIS — R05 Cough: Secondary | ICD-10-CM

## 2012-01-24 MED ORDER — AZITHROMYCIN 250 MG PO TABS: ORAL_TABLET | ORAL | Status: AC

## 2012-01-24 MED ORDER — AZELASTINE HCL 0.1 % NA SOLN: 2.0000 | Freq: Every day | NASAL | Status: DC

## 2012-01-24 NOTE — Progress Notes (Signed)
  Subjective:    Patient ID: Cameron [name], male    DOB: 12/26/1945, 66 y.o.   MRN: [id number]  HPI  Acute visit Since his last office visit in January 2013 the cough got better but was not completely gone, has daily cough , occasionally produce a gray sputum.  Past Medical History:  Hypertension  Osteopenia DEXA 12-2006  Sleep apnea  History of iron deficiency anemia  GI:  --diverticulosis --Colon polyps  --Fatty liver  04-2011 admitted to [facility] hospital w/ prostatitis  Past Surgical History:  Cholecystectomy 2003  back surgery 2003 Social History: 2 kids, married Patient never smoked.  ETOH-- socially pt works in [employer]   Review of Systems Has sleep apnea, the CPAP tubing is new, filters are new. He is taking Prilosec, no acid reflux He is taking a nasal steroid, postnasal dripping improve but not completely gone. Denies fever or chills No wheezing. He is a nonsmoker, no history of asthma.    Objective:   Physical Exam  Alert oriented x3. Ears: Normal tympanic membrane on the right, wax on the left. Throat without redness. Lungs are clear to auscultation bilaterally Cardiovascular regular rate and rhythm without a murmur Extremities without edema.     Assessment & Plan:

## 2012-01-24 NOTE — Patient Instructions (Signed)
For cough: zpack x 5 days, add the second nasal spray. If the cough is not better in a matter of 2-3 weeks, please call your lung doctor for an appointment. You are due for a complete physical exam, come back fasting at your earliest convenience

## 2012-01-24 NOTE — Assessment & Plan Note (Addendum)
Persisting cough since the last office visit despite taking PPIs and nasal steroids. Denies any wheezing, still has some postnasal dripping. Chest x-ray 11-2011 negative. Atypical bronchitis? Needs better control of postnasal dripping? Plan: Astelin, Z-Pak, if no better, I encouraged him to call his pulmonologist for further cough evaluation.

## 2012-01-25 ENCOUNTER — Encounter: Payer: Self-pay | Admitting: Internal Medicine

## 2012-02-21 ENCOUNTER — Ambulatory Visit (INDEPENDENT_AMBULATORY_CARE_PROVIDER_SITE_OTHER): Payer: BC Managed Care – PPO | Admitting: *Deleted

## 2012-02-21 DIAGNOSIS — E538 Deficiency of other specified B group vitamins: Secondary | ICD-10-CM

## 2012-02-21 MED ORDER — CYANOCOBALAMIN 1000 MCG/ML IJ SOLN
1000.0000 ug | Freq: Once | INTRAMUSCULAR | Status: AC
  Administered 2012-02-21: 1000 ug via INTRAMUSCULAR

## 2012-03-12 ENCOUNTER — Other Ambulatory Visit: Payer: Self-pay | Admitting: Internal Medicine

## 2012-03-13 NOTE — Telephone Encounter (Signed)
Refill done.  

## 2012-03-14 ENCOUNTER — Encounter: Payer: Self-pay | Admitting: Pulmonary Disease

## 2012-03-14 ENCOUNTER — Ambulatory Visit (INDEPENDENT_AMBULATORY_CARE_PROVIDER_SITE_OTHER): Payer: BC Managed Care – PPO | Admitting: Pulmonary Disease

## 2012-03-14 VITALS — BP 128/82 | HR 66 | Temp 97.4°F | Ht 72.0 in | Wt 333.8 lb

## 2012-03-14 DIAGNOSIS — G4733 Obstructive sleep apnea (adult) (pediatric): Secondary | ICD-10-CM

## 2012-03-14 NOTE — Patient Instructions (Signed)
Will have apria check your machine function Work on weight loss followup with me in one year if doing well.

## 2012-03-14 NOTE — Progress Notes (Signed)
  Subjective:    Patient ID: Cameron [name], male    DOB: [birth year], 66 y.o.   MRN: [id number]  HPI Patient comes in today for followup of his nonobstructive sleep apnea.  He is wearing CPAP compliantly, and is having no issues with his mask or pressure.  He is having issues with the ramp button on his machine, I would like to have this checked.  He feels that he is sleeping well, and has adequate daytime alertness.  His weight is essentially unchanged from the last visit.   Review of Systems  Constitutional: Negative for fever and unexpected weight change.  HENT: Positive for sinus pressure. Negative for ear pain, nosebleeds, congestion, sore throat, rhinorrhea, sneezing, trouble swallowing, dental problem and postnasal drip.   Eyes: Negative for redness and itching.  Respiratory: Positive for cough. Negative for chest tightness, shortness of breath and wheezing.   Cardiovascular: Negative for palpitations and leg swelling.  Gastrointestinal: Negative for nausea and vomiting.  Genitourinary: Negative for dysuria.  Musculoskeletal: Negative for joint swelling.  Skin: Negative for rash.  Neurological: Negative for headaches.  Hematological: Does not bruise/bleed easily.  Psychiatric/Behavioral: Negative for dysphoric mood. The patient is not nervous/anxious.        Objective:   Physical Exam Obese male in no acute distress No skin breakdown or pressure necrosis from the CPAP mask Lower extremities without edema, no cyanosis Alert, does not appear to be sleepy, moves all 4 extremities.       Assessment & Plan:

## 2012-03-14 NOTE — Assessment & Plan Note (Signed)
The patient is doing very well from a sleep apnea standpoint.  He is wearing his CPAP compliantly, and feels that his sleep and daytime alertness are adequate.  I have encouraged him to keep up with his mask changes and supplies, and to work aggressively on weight loss.  He will followup with me in one year.

## 2012-03-30 ENCOUNTER — Encounter: Payer: Self-pay | Admitting: Internal Medicine

## 2012-03-30 ENCOUNTER — Ambulatory Visit (INDEPENDENT_AMBULATORY_CARE_PROVIDER_SITE_OTHER): Payer: BC Managed Care – PPO | Admitting: Internal Medicine

## 2012-03-30 DIAGNOSIS — I10 Essential (primary) hypertension: Secondary | ICD-10-CM

## 2012-03-30 DIAGNOSIS — Z0000 Encounter for general adult medical examination without abnormal findings: Secondary | ICD-10-CM

## 2012-03-30 DIAGNOSIS — E538 Deficiency of other specified B group vitamins: Secondary | ICD-10-CM

## 2012-03-30 DIAGNOSIS — H6120 Impacted cerumen, unspecified ear: Secondary | ICD-10-CM

## 2012-03-30 DIAGNOSIS — D509 Iron deficiency anemia, unspecified: Secondary | ICD-10-CM

## 2012-03-30 DIAGNOSIS — M8580 Other specified disorders of bone density and structure, unspecified site: Secondary | ICD-10-CM

## 2012-03-30 HISTORY — DX: Deficiency of other specified B group vitamins: E53.8

## 2012-03-30 HISTORY — DX: Encounter for general adult medical examination without abnormal findings: Z00.00

## 2012-03-30 LAB — CBC WITH DIFFERENTIAL/PLATELET
Basophils Absolute: 0 10*3/uL (ref 0.0–0.1)
Basophils Relative: 0.2 % (ref 0.0–3.0)
Eosinophils Absolute: 0.2 10*3/uL (ref 0.0–0.7)
Eosinophils Relative: 4.8 % (ref 0.0–5.0)
HCT: 41.2 % (ref 39.0–52.0)
Hemoglobin: 13.6 g/dL (ref 13.0–17.0)
Lymphocytes Relative: 36.2 % (ref 12.0–46.0)
Lymphs Abs: 1.5 10*3/uL (ref 0.7–4.0)
MCHC: 33.1 g/dL (ref 30.0–36.0)
MCV: 88.5 fl (ref 78.0–100.0)
Monocytes Absolute: 0.5 10*3/uL (ref 0.1–1.0)
Monocytes Relative: 12.7 % — ABNORMAL HIGH (ref 3.0–12.0)
Neutro Abs: 1.9 10*3/uL (ref 1.4–7.7)
Neutrophils Relative %: 46.1 % (ref 43.0–77.0)
Platelets: 198 10*3/uL (ref 150.0–400.0)
RBC: 4.65 Mil/uL (ref 4.22–5.81)
RDW: 14.2 % (ref 11.5–14.6)
WBC: 4.2 10*3/uL — ABNORMAL LOW (ref 4.5–10.5)

## 2012-03-30 LAB — COMPREHENSIVE METABOLIC PANEL
ALT: 19 U/L (ref 0–53)
AST: 17 U/L (ref 0–37)
Albumin: 3.9 g/dL (ref 3.5–5.2)
Alkaline Phosphatase: 99 U/L (ref 39–117)
BUN: 12 mg/dL (ref 6–23)
CO2: 29 mEq/L (ref 19–32)
Calcium: 9.2 mg/dL (ref 8.4–10.5)
Chloride: 103 mEq/L (ref 96–112)
Creatinine, Ser: 1 mg/dL (ref 0.4–1.5)
GFR: 96.9 mL/min (ref >60.00)
Glucose, Bld: 96 mg/dL (ref 70–99)
Potassium: 4 mEq/L (ref 3.5–5.1)
Sodium: 140 mEq/L (ref 135–145)
Total Bilirubin: 0.6 mg/dL (ref 0.3–1.2)
Total Protein: 6.7 g/dL (ref 6.0–8.3)

## 2012-03-30 LAB — LIPID PANEL
Cholesterol: 142 mg/dL (ref 0–200)
HDL: 38.7 mg/dL — ABNORMAL LOW (ref >39.00)
LDL Cholesterol: 92 mg/dL (ref 0–99)
Total CHOL/HDL Ratio: 4
Triglycerides: 59 mg/dL (ref 0.0–149.0)
VLDL: 11.8 mg/dL (ref 0.0–40.0)

## 2012-03-30 LAB — PSA: PSA: 0.68 ng/mL (ref 0.10–4.00)

## 2012-03-30 LAB — VITAMIN B12: Vitamin B-12: >1500 pg/mL — ABNORMAL HIGH (ref 211–911)

## 2012-03-30 LAB — IRON: Iron: 45 ug/dL (ref 42–165)

## 2012-03-30 LAB — TSH: TSH: 1.54 u[IU]/mL (ref 0.35–5.50)

## 2012-03-30 LAB — FERRITIN: Ferritin: 32.8 ng/mL (ref 22.0–322.0)

## 2012-03-30 LAB — FOLATE: Folate: 4.1 ng/mL — ABNORMAL LOW (ref >5.9)

## 2012-03-30 MED ORDER — METOPROLOL SUCCINATE ER 25 MG PO TB24: ORAL_TABLET | ORAL | Status: DC

## 2012-03-30 NOTE — Assessment & Plan Note (Signed)
Well controlled 

## 2012-03-30 NOTE — Assessment & Plan Note (Signed)
Td 07 Colonoscopy:  05/08/2004, neg--no polyps  Colonoscopy again 12-2009-- tics, per GI next 2016  Labs  Diet and exercise discussed

## 2012-03-30 NOTE — Progress Notes (Signed)
  Subjective:    Patient ID: Cameron [name], male    DOB: 13-Mar-1946, 66 y.o.   MRN: [id number]  HPI CPX  Past Medical History:   Hypertension   Osteopenia DEXA 12-2006   Sleep apnea   History of iron deficiency anemia   GI:   --diverticulosis --Colon polyps   --Fatty liver   04-2011 admitted to [facility] hospital w/ prostatitis    Past Surgical History:   Cholecystectomy 2003   back surgery 2003  Social History: 2 kids, married Patient never smoked.   ETOH-- socially pt works in [employer] Diet-- "i have a sweet tooth" Exercise-- plays tennis sometimes, yard work   Family History: colon ca--maternal uncle, GF prostate cancer--no   MI--no Stroke-- no no close relatives w/ DM HTN-- F    Review of Systems Ambulatory BPs 120/80 No chest pain or shortness of breath No nausea vomiting, occasional diarrhea. Complains of and "airplane feeling" in the left ear on and off. He was seen by pulmonary with cough few weeks ago, symptoms resolve. No dysuria or [name] hematuria.     Objective:   Physical Exam  General -- alert, well-developed, and overweight appearing. No apparent distress.  Neck --no thyromegaly Lungs -- normal respiratory effort, no intercostal retractions, no accessory muscle use, and normal breath sounds.   Heart-- normal rate, regular rhythm, no murmur, and no gallop.   Abdomen--soft, non-tender, no distention, no masses, no HSM, no guarding, and no rigidity.   Extremities-- no pretibial edema bilaterally  DRE-- limited by patient's size but external examination normal, rectum normal, prostate normal Neurologic-- alert & oriented X3 and strength normal in all extremities. Psych-- Cognition and judgment appear intact. Alert and cooperative with normal attention span and concentration.  not anxious appearing and not depressed appearing.      Assessment & Plan:

## 2012-03-30 NOTE — Assessment & Plan Note (Addendum)
Abundant cerumen of the left ear partially removed with a spoon. Recommend H2 O2. Addendum, he did have some bleeding after the procedure, he had a scratch at the ear canal, I apologized, I don't anticipate any serious problem. He will call me if the bleeding continues.

## 2012-03-30 NOTE — Assessment & Plan Note (Signed)
On oral supplementation and monthly shots. Labs

## 2012-03-30 NOTE — Assessment & Plan Note (Signed)
Per chart review, and 2008 a bone density test show osteopenia. History of vitamin D deficiency. Plan:  Bone density test, check a vitamin D level.

## 2012-03-30 NOTE — Assessment & Plan Note (Signed)
Labs

## 2012-03-31 LAB — VITAMIN D 25 HYDROXY (VIT D DEFICIENCY, FRACTURES): Vit D, 25-Hydroxy: 20 ng/mL — ABNORMAL LOW (ref 30–89)

## 2012-04-10 ENCOUNTER — Encounter: Payer: Self-pay | Admitting: *Deleted

## 2012-04-10 ENCOUNTER — Other Ambulatory Visit: Payer: Self-pay | Admitting: *Deleted

## 2012-04-10 MED ORDER — FOLIC ACID 1 MG PO TABS: 1.0000 mg | ORAL_TABLET | Freq: Every day | ORAL | Status: DC

## 2012-04-10 MED ORDER — ERGOCALCIFEROL 1.25 MG (50000 UT) PO CAPS: 50000.0000 [IU] | ORAL_CAPSULE | ORAL | Status: AC

## 2012-04-11 ENCOUNTER — Encounter: Payer: Self-pay | Admitting: Internal Medicine

## 2012-04-11 ENCOUNTER — Telehealth: Payer: Self-pay | Admitting: Internal Medicine

## 2012-04-11 ENCOUNTER — Ambulatory Visit (INDEPENDENT_AMBULATORY_CARE_PROVIDER_SITE_OTHER): Payer: BC Managed Care – PPO | Admitting: Internal Medicine

## 2012-04-11 VITALS — BP 112/70 | HR 75 | Temp 98.3°F | Wt 326.0 lb

## 2012-04-11 DIAGNOSIS — K529 Noninfective gastroenteritis and colitis, unspecified: Secondary | ICD-10-CM

## 2012-04-11 DIAGNOSIS — R82998 Other abnormal findings in urine: Secondary | ICD-10-CM

## 2012-04-11 DIAGNOSIS — K5289 Other specified noninfective gastroenteritis and colitis: Secondary | ICD-10-CM

## 2012-04-11 LAB — POCT URINALYSIS DIPSTICK
Glucose, UA: NEGATIVE
Ketones, UA: NEGATIVE
Leukocytes, UA: NEGATIVE
Nitrite, UA: NEGATIVE
Protein, UA: 30
Spec Grav, UA: 1.025
Urobilinogen, UA: 1
pH, UA: 6

## 2012-04-11 NOTE — Telephone Encounter (Signed)
Returned pt's daughter's phone call, informed her that she was not on her father's dpr & that i could not discuss anything with her & she stated that she understood. She wanted to let Dr. Drue Novel know some concerns that her & the pt's wife had about the pt. She stated that the pt's urine continues to be very dark. She states that he has also picked up a "stomach bug," & has been very tired lately. Pt's daughter also stated that the pt went on a bike ride over the weekend & had to stop several times because he was too tired.   Pt is not aware of his daughter or his wife contacting us with this information.

## 2012-04-11 NOTE — Telephone Encounter (Signed)
noted 

## 2012-04-11 NOTE — Telephone Encounter (Signed)
would like to speak with you before her fathers 1pm appt. today  Adrianne's ph# [phone]

## 2012-04-11 NOTE — Progress Notes (Signed)
  Subjective:    Patient ID: Cameron [name], male    DOB: Jun 02, 1946, 66 y.o.   MRN: [id number]  HPI Acute visit 4 days ago developed nausea and vomiting. Later that day had  also diarrhea. Nausea and vomiting resolved within 24 hours, diarrhea resolved in 48 hours. He took some Pepto-Bismol. The nurse at work told him there is other  people affected, wife has similar symptoms now. Overall he feels better, he still had some nausea.  Past Medical History:  Hypertension  Osteopenia DEXA 12-2006  Sleep apnea  History of iron deficiency anemia  GI:  --diverticulosis  --Colon polyps  --Fatty liver  04-2011 admitted to [facility] hospital w/ prostatitis  Past Surgical History:  Cholecystectomy 2003  back surgery 2003  Social History:  2 kids, married  Patient never smoked.  ETOH-- socially  pt works in [employer]  Diet-- "i have a sweet tooth"  Exercise-- plays tennis sometimes, yard work  Family History:  colon ca--maternal uncle, GF  prostate cancer--no  MI--no  Stroke-- no  no close relatives w/ DM  HTN-- F    Review of Systems No fever chills, no abdominal pain "just nausea" No blood in the stools. His urine seems more "concentrated" but not bloody. He is feeling slightly tired since the onset of symptoms. No chest pain or shortness of breath. He ryde his motorcycle last weekend without any problems    Objective:   Physical Exam  General -- alert, well-developed, and overweight appearing. No apparent distress.  Lungs -- normal respiratory effort, no intercostal retractions, no accessory muscle use, and normal breath sounds.   Heart-- normal rate, regular rhythm, no murmur, and no gallop.   Abdomen--soft, non-tender, no distention, normal bowel sounds. Extremities-- no pretibial edema bilaterally  Neurologic-- alert & oriented X3 and strength normal in all extremities. Psych-- Cognition and judgment appear intact. Alert and cooperative with normal attention span and  concentration.  not anxious appearing and not depressed appearing.       Assessment & Plan:   Acute gastroenteritis: Symptoms consistent with acute gastroenteritis, understandably he feels tired and his urine may be having a more intense color. Udip showed trace blood, will get a UA and a urine culture. Plan:see instructions

## 2012-04-11 NOTE — Patient Instructions (Signed)
Rest, drink plenty of fluids. Pepto-Bismol as needed Call anytime if you get  worse, you feel  more tired, have fever, stomach pain, blood in the stools or blood in the urine. Also call if symptoms continue beyond  4 to 5 days.

## 2012-04-12 LAB — URINALYSIS, MICROSCOPIC ONLY
Bacteria, UA: NONE SEEN
Casts: NONE SEEN
Crystals: NONE SEEN
Squamous Epithelial / LPF: NONE SEEN

## 2012-04-15 ENCOUNTER — Telehealth: Payer: Self-pay | Admitting: Internal Medicine

## 2012-04-15 LAB — URINE CULTURE: Colony Count: >=100000

## 2012-04-15 MED ORDER — CIPROFLOXACIN HCL 500 MG PO TABS: 500.0000 mg | ORAL_TABLET | Freq: Two times a day (BID) | ORAL | Status: AC

## 2012-04-15 NOTE — Telephone Encounter (Signed)
Spoke w/pt, ucx is +, he is now asx Called cipro 500 mg 1 po bid x 10 days He will call next week w/ the name of his urologist who he plans to f/u with soon; will fax results to him

## 2012-04-17 NOTE — Telephone Encounter (Signed)
Faxed results.

## 2012-04-17 NOTE — Telephone Encounter (Signed)
Pt called back to let you know he is scheduled to see Dr. Tobie Lords at [facility] this Friday, 04/21/12.

## 2012-04-17 NOTE — Telephone Encounter (Signed)
Fax urine culture results to urology

## 2012-04-18 ENCOUNTER — Telehealth: Payer: Self-pay | Admitting: Internal Medicine

## 2012-04-18 NOTE — Telephone Encounter (Signed)
Spoke with pts wife

## 2012-04-18 NOTE — Telephone Encounter (Signed)
Spouse Cameron [name] wants lab results, please call at [phone] she is on [facility]

## 2012-07-26 ENCOUNTER — Other Ambulatory Visit: Payer: BC Managed Care – PPO

## 2013-02-21 ENCOUNTER — Encounter: Payer: Self-pay | Admitting: *Deleted

## 2013-02-21 ENCOUNTER — Other Ambulatory Visit: Payer: Self-pay | Admitting: Internal Medicine

## 2013-02-21 NOTE — Telephone Encounter (Signed)
Refill done.  Letter mailed to pt to make aware he is due for an OV.

## 2013-03-14 ENCOUNTER — Ambulatory Visit: Payer: BC Managed Care – PPO | Admitting: Pulmonary Disease

## 2013-04-06 ENCOUNTER — Ambulatory Visit (INDEPENDENT_AMBULATORY_CARE_PROVIDER_SITE_OTHER): Payer: BC Managed Care – PPO | Admitting: Internal Medicine

## 2013-04-06 ENCOUNTER — Encounter: Payer: Self-pay | Admitting: Internal Medicine

## 2013-04-06 VITALS — BP 122/80 | HR 65 | Temp 98.4°F | Ht 71.0 in | Wt 338.0 lb

## 2013-04-06 DIAGNOSIS — I10 Essential (primary) hypertension: Secondary | ICD-10-CM

## 2013-04-06 DIAGNOSIS — M8580 Other specified disorders of bone density and structure, unspecified site: Secondary | ICD-10-CM

## 2013-04-06 DIAGNOSIS — Z2911 Encounter for prophylactic immunotherapy for respiratory syncytial virus (RSV): Secondary | ICD-10-CM

## 2013-04-06 DIAGNOSIS — K760 Fatty (change of) liver, not elsewhere classified: Secondary | ICD-10-CM

## 2013-04-06 DIAGNOSIS — Z0000 Encounter for general adult medical examination without abnormal findings: Secondary | ICD-10-CM

## 2013-04-06 DIAGNOSIS — D509 Iron deficiency anemia, unspecified: Secondary | ICD-10-CM

## 2013-04-06 DIAGNOSIS — Z23 Encounter for immunization: Secondary | ICD-10-CM

## 2013-04-06 DIAGNOSIS — E538 Deficiency of other specified B group vitamins: Secondary | ICD-10-CM

## 2013-04-06 LAB — CBC WITH DIFFERENTIAL/PLATELET
Basophils Absolute: 0 10*3/uL (ref 0.0–0.1)
Basophils Relative: 0.7 % (ref 0.0–3.0)
Eosinophils Absolute: 0.2 10*3/uL (ref 0.0–0.7)
Eosinophils Relative: 4.8 % (ref 0.0–5.0)
HCT: 36.2 % — ABNORMAL LOW (ref 39.0–52.0)
Hemoglobin: 12.5 g/dL — ABNORMAL LOW (ref 13.0–17.0)
Lymphocytes Relative: 31.7 % (ref 12.0–46.0)
Lymphs Abs: 1.2 10*3/uL (ref 0.7–4.0)
MCHC: 34.5 g/dL (ref 30.0–36.0)
MCV: 85.5 fl (ref 78.0–100.0)
Monocytes Absolute: 0.6 10*3/uL (ref 0.1–1.0)
Monocytes Relative: 15.7 % — ABNORMAL HIGH (ref 3.0–12.0)
Neutro Abs: 1.7 10*3/uL (ref 1.4–7.7)
Neutrophils Relative %: 47.1 % (ref 43.0–77.0)
Platelets: 187 10*3/uL (ref 150.0–400.0)
RBC: 4.23 Mil/uL (ref 4.22–5.81)
RDW: 14.2 % (ref 11.5–14.6)
WBC: 3.7 10*3/uL — ABNORMAL LOW (ref 4.5–10.5)

## 2013-04-06 LAB — COMPREHENSIVE METABOLIC PANEL
ALT: 18 U/L (ref 0–53)
AST: 17 U/L (ref 0–37)
Albumin: 3.8 g/dL (ref 3.5–5.2)
Alkaline Phosphatase: 86 U/L (ref 39–117)
BUN: 11 mg/dL (ref 6–23)
CO2: 28 mEq/L (ref 19–32)
Calcium: 8.9 mg/dL (ref 8.4–10.5)
Chloride: 108 mEq/L (ref 96–112)
Creatinine, Ser: 0.9 mg/dL (ref 0.4–1.5)
GFR: 105.03 mL/min (ref >60.00)
Glucose, Bld: 93 mg/dL (ref 70–99)
Potassium: 3.5 mEq/L (ref 3.5–5.1)
Sodium: 141 mEq/L (ref 135–145)
Total Bilirubin: 0.9 mg/dL (ref 0.3–1.2)
Total Protein: 6.8 g/dL (ref 6.0–8.3)

## 2013-04-06 LAB — LIPID PANEL
Cholesterol: 149 mg/dL (ref 0–200)
HDL: 42.1 mg/dL (ref >39.00)
LDL Cholesterol: 97 mg/dL (ref 0–99)
Total CHOL/HDL Ratio: 4
Triglycerides: 52 mg/dL (ref 0.0–149.0)
VLDL: 10.4 mg/dL (ref 0.0–40.0)

## 2013-04-06 LAB — VITAMIN B12: Vitamin B-12: 336 pg/mL (ref 211–911)

## 2013-04-06 LAB — FOLATE: Folate: >24.8 ng/mL (ref >5.9)

## 2013-04-06 NOTE — Patient Instructions (Addendum)
Next visit 6 months 

## 2013-04-06 NOTE — Assessment & Plan Note (Signed)
On folic acid, not taking B12 lately-- labs

## 2013-04-06 NOTE — Assessment & Plan Note (Addendum)
DEXA ordered 2013, not done----. reschedule Vitamin D was low in 2011 and 2013, was prescribed ergocalciferol states he took it, now on OTC Vit D------ > labs

## 2013-04-06 NOTE — Progress Notes (Signed)
  Subjective:    Patient ID: Cameron [name], male    DOB: [birth year], 67 y.o.   MRN: [id number]  HPI CPX  Past Medical History  Diagnosis Date  . HTN (hypertension)   . Diverticulosis of colon   . Osteopenia     DEXA 12-2006    . Sleep apnea     on CPAP  . Personal history of colonic polyps 2002    TUBULAR ADENOMA  . Family history of malignant neoplasm of gastrointestinal tract   . Iron deficiency anemia     h/o  . Fatty liver   . Prostatitis, acute 04-2011    admitted to [facility]   Past Surgical History  Procedure Laterality Date  . Cholecystectomy  2003  . Back surgery  2003   History   Social History  . Marital Status: Married    Spouse Name: N/A    Number of Children: 2  . Years of Education: N/A   Occupational History  . works in [employer]    Social History Main Topics  . Smoking status: Never Smoker   . Smokeless tobacco: Never Used  . Alcohol Use: No     Comment: socially  . Drug Use: No  . Sexually Active: Not on file   Other Topics Concern  . Not on file   Social History Narrative   Diet - "i have a sweet tooth"  , regular    Exercise-- plays tennis sometimes, yard work     Family History  Problem Relation Age of Onset  . Colon cancer Maternal Uncle   . Colon cancer Other     grandfather  . Prostate cancer Neg Hx   . Heart attack Neg Hx   . Hypertension Neg Hx   . Diabetes Neg Hx   . Stroke Other     maternal aunt in her 43s     Review of Systems Diet - "i have a sweet tooth" , regular  Exercise-- plays tennis sometimes, yard work   No chest pain or shortness or breath No nausea, vomiting, diarrhea or blood in the stools. No anxiety, depression. Occasional urinary urgency without dysuria or [name] hematuria. No fever or chills. Occasional stiffness in the right knee after he sits for a while, better after a short walk. No swelling or redness     Objective:   Physical Exam BP 122/80  Pulse 65  Temp(Src) 98.4 F (36.9 C) (Oral)  Ht 5'  11" (1.803 m)  Wt 338 lb (153.316 kg)  BMI 47.16 kg/m2  SpO2 98%  General -- alert, well-developed, NAD.   Neck --no thyromegaly   Lungs -- normal respiratory effort, no intercostal retractions, no accessory muscle use, and normal breath sounds.   Heart-- normal rate, regular rhythm, no murmur, and no gallop.   Abdomen--soft, non-tender, no distention, no masses, no HSM, no guarding, and no rigidity.   Extremities-- no pretibial edema bilaterally; knees symmetric, range of motion normal, no effusion or warmness  Neurologic-- alert & oriented X3 and strength normal in all extremities. Psych-- Cognition and judgment appear intact. Alert and cooperative with normal attention span and concentration.  not anxious appearing and not depressed appearing.       Assessment & Plan:

## 2013-04-06 NOTE — Assessment & Plan Note (Signed)
Per u/s 2012, LFTs always wnl

## 2013-04-06 NOTE — Assessment & Plan Note (Addendum)
Td 07 zostavax discussed, will get it today Colonoscopy:  05/08/2004, neg--no polyps  Colonoscopy: 12-2009-- tics Cscope again 05-03-11, polyp, 5 years  PSA per urology has an upcoming appointment Diet and exercise discussed, refer to a nutritionist

## 2013-04-06 NOTE — Assessment & Plan Note (Signed)
Resolved per labs 2013, update on cscopes

## 2013-04-06 NOTE — Assessment & Plan Note (Signed)
Well controlled 

## 2013-04-07 LAB — VITAMIN D 25 HYDROXY (VIT D DEFICIENCY, FRACTURES): Vit D, 25-Hydroxy: 36 ng/mL (ref 30–89)

## 2013-04-10 ENCOUNTER — Encounter: Payer: Self-pay | Admitting: *Deleted

## 2013-05-06 ENCOUNTER — Other Ambulatory Visit: Payer: Self-pay | Admitting: Internal Medicine

## 2013-05-07 NOTE — Telephone Encounter (Signed)
Refill done per protocol.  

## 2013-07-24 ENCOUNTER — Other Ambulatory Visit: Payer: Self-pay | Admitting: Internal Medicine

## 2013-10-08 ENCOUNTER — Ambulatory Visit: Payer: BC Managed Care – PPO | Admitting: Internal Medicine

## 2013-10-08 DIAGNOSIS — Z0289 Encounter for other administrative examinations: Secondary | ICD-10-CM

## 2013-10-11 ENCOUNTER — Other Ambulatory Visit: Payer: Self-pay | Admitting: Internal Medicine

## 2013-10-11 NOTE — Telephone Encounter (Signed)
Metoprolol refilled per protocol 

## 2013-10-23 ENCOUNTER — Other Ambulatory Visit: Payer: Self-pay | Admitting: Internal Medicine

## 2013-10-24 NOTE — Telephone Encounter (Signed)
rx refilled per protocol. DJR  

## 2014-01-17 ENCOUNTER — Other Ambulatory Visit: Payer: Self-pay | Admitting: Internal Medicine

## 2014-02-17 ENCOUNTER — Other Ambulatory Visit: Payer: Self-pay | Admitting: Internal Medicine

## 2014-03-24 ENCOUNTER — Other Ambulatory Visit: Payer: Self-pay | Admitting: Internal Medicine

## 2014-05-02 ENCOUNTER — Other Ambulatory Visit: Payer: Self-pay | Admitting: Orthopaedic Surgery

## 2014-05-17 ENCOUNTER — Encounter: Payer: Self-pay | Admitting: Internal Medicine

## 2014-05-17 ENCOUNTER — Ambulatory Visit (HOSPITAL_BASED_OUTPATIENT_CLINIC_OR_DEPARTMENT_OTHER)
Admission: RE | Admit: 2014-05-17 | Discharge: 2014-05-17 | Disposition: A | Discharge status: Home / Self Care | Payer: BC Managed Care – PPO | Source: Ambulatory Visit | Attending: Internal Medicine | Admitting: Internal Medicine

## 2014-05-17 ENCOUNTER — Ambulatory Visit (INDEPENDENT_AMBULATORY_CARE_PROVIDER_SITE_OTHER): Payer: BC Managed Care – PPO | Admitting: Internal Medicine

## 2014-05-17 ENCOUNTER — Telehealth: Payer: Self-pay

## 2014-05-17 VITALS — BP 130/70 | HR 71 | Temp 98.0°F | Wt 332.0 lb

## 2014-05-17 DIAGNOSIS — R600 Localized edema: Secondary | ICD-10-CM

## 2014-05-17 DIAGNOSIS — R609 Edema, unspecified: Secondary | ICD-10-CM | POA: Insufficient documentation

## 2014-05-17 DIAGNOSIS — I10 Essential (primary) hypertension: Secondary | ICD-10-CM

## 2014-05-17 DIAGNOSIS — I83892 Varicose veins of left lower extremities with other complications: Secondary | ICD-10-CM

## 2014-05-17 DIAGNOSIS — G5620 Lesion of ulnar nerve, unspecified upper limb: Secondary | ICD-10-CM

## 2014-05-17 NOTE — Progress Notes (Signed)
Pre visit review using our clinic review tool, if applicable. No additional management support is needed unless otherwise documented below in the visit note. 

## 2014-05-17 NOTE — Patient Instructions (Signed)
We are scheduling ultrasound for today to rule out a clot in the leg

## 2014-05-17 NOTE — Progress Notes (Signed)
Subjective:    Patient ID: Cameron [name], male    DOB: [birth year], 68 y.o.   MRN: [id number]  DOS:  05/17/2014 Type of visit - description: acute, 2 problems History: 2 weeks ago noted swelling around the left ankle. Denies pain, no history of recent sprain. Also has a long history of tingling of both hands, usually around his 4th and fifth finger. He works at [employer] and types many hours a day.   ROS Denies fever or chills No chest pain, difficulty breathing or palpitations. No recent airplane trips but 2 weeks ago he drove his motorcycle to [place], he recalls that the  swelling of the ankle started before that trip. Denies calf pain. Also denies elbow pain or neck pain  Past Medical History  Diagnosis Date  . HTN (hypertension)   . Diverticulosis of colon   . Osteopenia     DEXA 12-2006    . Sleep apnea     on CPAP  . Personal history of colonic polyps 2002    TUBULAR ADENOMA  . Family history of malignant neoplasm of gastrointestinal tract   . Iron deficiency anemia     h/o  . Fatty liver   . Prostatitis, acute 04-2011    admitted to [facility]    Past Surgical History  Procedure Laterality Date  . Cholecystectomy  2003  . Back surgery  2003    History   Social History  . Marital Status: Married    Spouse Name: N/A    Number of Children: 2  . Years of Education: N/A   Occupational History  . works in [employer]  . Smoking status: Never Smoker   . Smokeless tobacco: Never Used  . Alcohol Use: No     Comment: socially  . Drug Use: No  . Sexual Activity: Not on file   Other Topics Concern  . Not on file   Social History Narrative    plays tennis sometimes         Medication List       This list is accurate as of: 05/17/14 11:59 PM.  Always use your most recent med list.               aspirin 81 MG tablet  Take 81 mg by mouth daily.     folic acid 1 MG tablet  Commonly known as:  FOLVITE  TAKE 1 TABLET BY  MOUTH EVERY DAY. NEEDS APPT     latanoprost 0.005 % ophthalmic solution  Commonly known as:  XALATAN     loratadine 10 MG tablet  Commonly known as:  CLARITIN  Take 10 mg by mouth daily.     metoprolol succinate 25 MG 24 hr tablet  Commonly known as:  TOPROL-XL  TAKE ONE-HALF (1/2) TABLET DAILY     MYRBETRIQ 50 MG Tb24 tablet  Generic drug:  mirabegron ER     omeprazole 20 MG tablet  Commonly known as:  PRILOSEC OTC  Take 20 mg by mouth daily.     vitamin C 1000 MG tablet  Take 2,000 mg by mouth daily.           Objective:   Physical Exam BP 130/70  Pulse 71  Temp(Src) 98 F (36.7 C)  Wt 332 lb (150.594 kg)  SpO2 94% General -- alert, well-developed, NAD.  Neck --no TTP  Lungs -- normal respiratory effort, no intercostal retractions, no accessory muscle use, and  normal breath sounds.  Heart-- normal rate, regular rhythm, no murmur.   Extremities-- calves not tender to palpation, they are  Symmetric when measured with a tape;  he does have mild swelling around the L external malleolus without redness or tenderness. Elbows normal to inspection on palpation. Hands normal to inspection on palpation Neurologic--  alert & oriented X3. Speech normal, gait appropriate for age, strength symmetric and appropriate for age.    Psych-- Cognition and judgment appear intact. Cooperative with normal attention span and concentration. No anxious or depressed appearing.        Assessment & Plan:  Left leg edema, Very mild swelling at the left ankle, my suspicious is low for a clot however we will ultrasound to rule out a DVT. If the ultrasound is negative I will recommend elevation and observation.

## 2014-05-17 NOTE — Telephone Encounter (Signed)
Spoke with patient and advised negative per Dr Virgil Benedict verbal.

## 2014-05-19 DIAGNOSIS — G5620 Lesion of ulnar nerve, unspecified upper limb: Secondary | ICD-10-CM

## 2014-05-19 HISTORY — DX: Lesion of ulnar nerve, unspecified upper limb: G56.20

## 2014-05-19 NOTE — Assessment & Plan Note (Signed)
Hypertension, BP was   elevated when he arrived to the clinic, we rechecked it and it was 130/70. BPs checked at work by a R.N. And they have usually 120-140/80. Plan: BP well-controlled, no change

## 2014-05-19 NOTE — Assessment & Plan Note (Signed)
Hands paresthesias consistent with cubital tunnel syndrome, prevention discussed including avoiding excessive elbow bending and using arm rest pads

## 2014-05-30 ENCOUNTER — Other Ambulatory Visit (HOSPITAL_COMMUNITY): Payer: BC Managed Care – PPO

## 2014-06-03 ENCOUNTER — Encounter (HOSPITAL_COMMUNITY): Payer: Self-pay | Admitting: Pharmacy Technician

## 2014-06-04 ENCOUNTER — Other Ambulatory Visit (HOSPITAL_COMMUNITY): Payer: Self-pay | Admitting: *Deleted

## 2014-06-04 ENCOUNTER — Ambulatory Visit (HOSPITAL_COMMUNITY)
Admission: RE | Admit: 2014-06-04 | Discharge: 2014-06-04 | Disposition: A | Discharge status: Home / Self Care | Payer: BC Managed Care – PPO | Source: Ambulatory Visit | Attending: Orthopaedic Surgery | Admitting: Orthopaedic Surgery

## 2014-06-04 ENCOUNTER — Encounter (HOSPITAL_COMMUNITY): Payer: Self-pay

## 2014-06-04 ENCOUNTER — Encounter (HOSPITAL_COMMUNITY)
Admission: RE | Admit: 2014-06-04 | Discharge: 2014-06-04 | Disposition: A | Discharge status: Home / Self Care | Payer: BC Managed Care – PPO | Source: Ambulatory Visit | Attending: Orthopaedic Surgery | Admitting: Orthopaedic Surgery

## 2014-06-04 DIAGNOSIS — M1710 Unilateral primary osteoarthritis, unspecified knee: Secondary | ICD-10-CM | POA: Diagnosis not present

## 2014-06-04 DIAGNOSIS — G4730 Sleep apnea, unspecified: Secondary | ICD-10-CM | POA: Diagnosis not present

## 2014-06-04 DIAGNOSIS — I10 Essential (primary) hypertension: Secondary | ICD-10-CM | POA: Insufficient documentation

## 2014-06-04 DIAGNOSIS — K219 Gastro-esophageal reflux disease without esophagitis: Secondary | ICD-10-CM | POA: Insufficient documentation

## 2014-06-04 DIAGNOSIS — R9431 Abnormal electrocardiogram [ECG] [EKG]: Secondary | ICD-10-CM | POA: Insufficient documentation

## 2014-06-04 DIAGNOSIS — D509 Iron deficiency anemia, unspecified: Secondary | ICD-10-CM | POA: Insufficient documentation

## 2014-06-04 DIAGNOSIS — Z01818 Encounter for other preprocedural examination: Secondary | ICD-10-CM | POA: Diagnosis not present

## 2014-06-04 DIAGNOSIS — R918 Other nonspecific abnormal finding of lung field: Secondary | ICD-10-CM | POA: Diagnosis not present

## 2014-06-04 HISTORY — DX: Unspecified osteoarthritis, unspecified site: M19.90

## 2014-06-04 HISTORY — DX: Gastro-esophageal reflux disease without esophagitis: K21.9

## 2014-06-04 HISTORY — DX: Unspecified glaucoma: H40.9

## 2014-06-04 LAB — CBC WITH DIFFERENTIAL/PLATELET
Basophils Absolute: 0 10*3/uL (ref 0.0–0.1)
Basophils Relative: 0 % (ref 0–1)
Eosinophils Absolute: 0.3 10*3/uL (ref 0.0–0.7)
Eosinophils Relative: 6 % — ABNORMAL HIGH (ref 0–5)
HCT: 42.4 % (ref 39.0–52.0)
Hemoglobin: 13.7 g/dL (ref 13.0–17.0)
Lymphocytes Relative: 31 % (ref 12–46)
Lymphs Abs: 1.5 10*3/uL (ref 0.7–4.0)
MCH: 28.4 pg (ref 26.0–34.0)
MCHC: 32.3 g/dL (ref 30.0–36.0)
MCV: 87.8 fL (ref 78.0–100.0)
Monocytes Absolute: 0.6 10*3/uL (ref 0.1–1.0)
Monocytes Relative: 11 % (ref 3–12)
Neutro Abs: 2.5 10*3/uL (ref 1.7–7.7)
Neutrophils Relative %: 52 % (ref 43–77)
Platelets: 220 10*3/uL (ref 150–400)
RBC: 4.83 MIL/uL (ref 4.22–5.81)
RDW: 13.6 % (ref 11.5–15.5)
WBC: 4.9 10*3/uL (ref 4.0–10.5)

## 2014-06-04 LAB — BASIC METABOLIC PANEL
Anion gap: 10 (ref 5–15)
BUN: 12 mg/dL (ref 6–23)
CO2: 30 mEq/L (ref 19–32)
Calcium: 9 mg/dL (ref 8.4–10.5)
Chloride: 103 mEq/L (ref 96–112)
Creatinine, Ser: 0.95 mg/dL (ref 0.50–1.35)
GFR calc Af Amer: >90 mL/min (ref >90)
GFR calc non Af Amer: 85 mL/min — ABNORMAL LOW (ref >90)
Glucose, Bld: 113 mg/dL — ABNORMAL HIGH (ref 70–99)
Potassium: 4.4 mEq/L (ref 3.7–5.3)
Sodium: 143 mEq/L (ref 137–147)

## 2014-06-04 LAB — URINALYSIS, ROUTINE W REFLEX MICROSCOPIC
Bilirubin Urine: NEGATIVE
Glucose, UA: NEGATIVE mg/dL
Hgb urine dipstick: NEGATIVE
Ketones, ur: NEGATIVE mg/dL
Leukocytes, UA: NEGATIVE
Nitrite: NEGATIVE
Protein, ur: NEGATIVE mg/dL
Specific Gravity, Urine: 1.025 (ref 1.005–1.030)
Urobilinogen, UA: 1 mg/dL (ref 0.0–1.0)
pH: 6 (ref 5.0–8.0)

## 2014-06-04 LAB — PROTIME-INR
INR: 1.16 (ref 0.00–1.49)
Prothrombin Time: 14.8 seconds (ref 11.6–15.2)

## 2014-06-04 LAB — SURGICAL PCR SCREEN
MRSA, PCR: NEGATIVE
Staphylococcus aureus: NEGATIVE

## 2014-06-04 LAB — APTT: aPTT: 27 seconds (ref 24–37)

## 2014-06-04 MED ORDER — CHLORHEXIDINE GLUCONATE 4 % EX LIQD: 60.0000 mL | Freq: Once | CUTANEOUS | Status: DC

## 2014-06-04 NOTE — Progress Notes (Signed)
Pt notified that he could remove the blood bank arm bracelet due to the fact that we will need to redraw the type and screen the day of surgery.

## 2014-06-04 NOTE — Progress Notes (Addendum)
Anesthesia Chart Review:  Patient is a 68 year old male scheduled for right TKA on 06/11/14 by Dr. Rhona Raider.   History includes non-smoker, HTN, diverticulosis, iron deficiency anemia, GERD, arthritis, fatty liver by abdominal ultrasound '12, prostatitis '12, osteopenia, glaucoma, cholecystectomy, back surgery, family history of cancer of the GI tract.  BMI is consistent with morbid obesity.  PCP is listed as Dr. Kathlene November.   CXR on 06/04/14 showed: Mild elevation of the right hemidiaphragm. Mild left lung base opacity. Mild aortic tortuosity. Heart size normal range. No overt pleural effusion. No pneumothorax. Surgical clips right upper quadrant. Mild multilevel degenerative changes. IMPRESSION: Mild left lung base opacity; atelectasis/scarring (favored) versus infiltrate. Mild elevation of the right hemidiaphragm.  Preoperative labs noted.  His T&S will need to be repeated due to Blood Bank not accepting the specimen and paperwork sent.  Patient left prior to getting his EKG.  Would favor trying to get him to come back in or go to Dr. Ethel Rana office for a pre-operative EKG rather than waiting until the day of surgery.  I was unable to get him on his cell phone this afternoon, so our staff will try to contact him.  [facility] Short Stay Center/Anesthesiology Phone [phone] 06/04/2014 2:51 PM  Addendum: 06/05/2014 5:15 PM Patient was able to return this afternoon for his EKG and repeat T&S.  He told his PAT RN that he was not having any URI symptoms or cough. He also denied chest pain, cardiac history, or SOB.  EKG today showed NSR. He does have Q waves in lead III which were present on his previous EKG on 04/25/2011.  His HR is slower and there is low voltage in aVF which is more pronounced since 04/25/2011, otherwise I think the tracing is stable.   Further evaluation by his assigned anesthesiologist on the day of surgery. If no acute cardiopulmonary symptoms then I would  anticipate that he could proceed as planned.

## 2014-06-04 NOTE — Pre-Procedure Instructions (Signed)
TAN CLOPPER  06/04/2014   Your procedure is scheduled on:  Tuesday, June 11, 2014 at 1:10 PM.   Report to [facility] Entrance "A" Admitting Office at 11:10 AM.   Call this number if you have problems the morning of surgery: [phone]   Remember:   Do not eat food or drink liquids after midnight Monday, 06/10/14.   Take these medicines the morning of surgery with A SIP OF WATER: lansoprazole (PREVACID), timolol (BETIMOL) eye drops  Stop Aspirin as of today.   Do not wear jewelry.  Do not wear lotions, powders, or cologne. You may wear deodorant.  Men may shave face and neck.  Do not bring valuables to the hospital.  [facility] is not responsible                  for any belongings or valuables.               Contacts, dentures or bridgework may not be worn into surgery.  Leave suitcase in the car. After surgery it may be brought to your room.  For patients admitted to the hospital, discharge time is determined by your                treatment team.           Special Instructions: [facility] - Preparing for Surgery  Before surgery, you can play an important role.  Because skin is not sterile, your skin needs to be as free of germs as possible.  You can reduce the number of germs on you skin by washing with CHG (chlorahexidine gluconate) soap before surgery.  CHG is an antiseptic cleaner which kills germs and bonds with the skin to continue killing germs even after washing.  Please DO NOT use if you have an allergy to CHG or antibacterial soaps.  If your skin becomes reddened/irritated stop using the CHG and inform your nurse when you arrive at Short Stay.  Do not shave (including legs and underarms) for at least 48 hours prior to the first CHG shower.  You may shave your face.  Please follow these instructions carefully:   1.  Shower with CHG Soap the night before surgery and the                                morning of Surgery.  2.  If you choose to wash your  hair, wash your hair first as usual with your       normal shampoo.  3.  After you shampoo, rinse your hair and body thoroughly to remove the                      Shampoo.  4.  Use CHG as you would any other liquid soap.  You can apply chg directly       to the skin and wash gently with scrungie or a clean washcloth.  5.  Apply the CHG Soap to your body ONLY FROM THE NECK DOWN.        Do not use on open wounds or open sores.  Avoid contact with your eyes, ears, mouth and genitals (private parts).  Wash genitals (private parts) with your normal soap.  6.  Wash thoroughly, paying special attention to the area where your surgery        will be performed.  7.  Thoroughly rinse  your body with warm water from the neck down.  8.  DO NOT shower/wash with your normal soap after using and rinsing off       the CHG Soap.  9.  Pat yourself dry with a clean towel.            10.  Wear clean pajamas.            11.  Place clean sheets on your bed the night of your first shower and do not        sleep with pets.  Day of Surgery  Do not apply any lotions the morning of surgery.  Please wear clean clothes to the hospital/surgery center.     Please read over the following fact sheets that you were given: Pain Booklet, Coughing and Deep Breathing, Blood Transfusion Information, MRSA Information and Surgical Site Infection Prevention

## 2014-06-04 NOTE — Progress Notes (Signed)
Pt's PCP is Dr. Larose Kells. Pt denies chest pain or sob. Denies cardiac history.

## 2014-06-05 ENCOUNTER — Encounter (HOSPITAL_COMMUNITY)
Admission: RE | Admit: 2014-06-05 | Discharge: 2014-06-05 | Disposition: A | Discharge status: Home / Self Care | Payer: BC Managed Care – PPO | Source: Ambulatory Visit | Attending: Orthopaedic Surgery | Admitting: Orthopaedic Surgery

## 2014-06-05 ENCOUNTER — Other Ambulatory Visit: Payer: Self-pay

## 2014-06-05 DIAGNOSIS — Z01810 Encounter for preprocedural cardiovascular examination: Secondary | ICD-10-CM | POA: Insufficient documentation

## 2014-06-05 LAB — TYPE AND SCREEN
ABO/RH(D): O POS
Antibody Screen: NEGATIVE

## 2014-06-05 LAB — ABO/RH: ABO/RH(D): O POS

## 2014-06-05 NOTE — Progress Notes (Signed)
Pt came today for his EKG and wanted to go ahead and have the Type and Screen drawn. Both done.

## 2014-06-07 NOTE — H&P (Signed)
TOTAL KNEE ADMISSION H&P  Patient is being admitted for right total knee arthroplasty.  Subjective:  Chief Complaint:right knee pain.  HPI: Cameron [name], 68 y.o. male, has a history of pain and functional disability in the right knee due to arthritis and has failed non-surgical conservative treatments for greater than 12 weeks to includeNSAID's and/or analgesics, corticosteriod injections, flexibility and strengthening excercises, weight reduction as appropriate and activity modification.  Onset of symptoms was gradual, starting 5 years ago with gradually worsening course since that time. The patient noted no past surgery on the right knee(s).  Patient currently rates pain in the right knee(s) at 10 out of 10 with activity. Patient has night pain, worsening of pain with activity and weight bearing, pain that interferes with activities of daily living and crepitus.  Patient has evidence of subchondral cysts, subchondral sclerosis, periarticular osteophytes and joint space narrowing by imaging studies. There is no active infection.  Patient Active Problem List   Diagnosis Date Noted  . Cubital tunnel syndrome 05/19/2014  . Annual physical exam 03/30/2012  . B12 deficiency 03/30/2012  . Osteopenia, low vit D 03/30/2012  . Fatty liver 04/16/2011  . Esophageal reflux 04/16/2011  . Obesity 04/16/2011  . OBSTRUCTIVE SLEEP APNEA 03/13/2009  . HYPERTENSION 08/02/2007  . DIVERTICULOSIS, COLON 08/02/2007   Past Medical History  Diagnosis Date  . HTN (hypertension)   . Diverticulosis of colon   . Osteopenia     DEXA 12-2006    . Sleep apnea     on CPAP  . Personal history of colonic polyps 2002    TUBULAR ADENOMA  . Family history of malignant neoplasm of gastrointestinal tract   . Iron deficiency anemia     h/o  . Fatty liver   . Prostatitis, acute 04-2011    admitted to [facility]  . GERD (gastroesophageal reflux disease)     takes Prevacid  . Arthritis     degenerative joint disease  .  Glaucoma (increased eye pressure)     Past Surgical History  Procedure Laterality Date  . Cholecystectomy  2003  . Back surgery  2003  . Colonoscopy      No prescriptions prior to admission   Allergies  Allergen Reactions  . Ciprofloxacin Hcl Itching  . Clindamycin     REACTION: rash    History  Substance Use Topics  . Smoking status: Never Smoker   . Smokeless tobacco: Never Used  . Alcohol Use: Yes     Comment: socially    Family History  Problem Relation Age of Onset  . Colon cancer Maternal Uncle   . Colon cancer Other     grandfather  . Prostate cancer Neg Hx   . Heart attack Neg Hx   . Hypertension Neg Hx   . Diabetes Neg Hx   . Stroke Other     maternal aunt in her 27s  . Dementia Mother   . Diverticulitis Father      Review of Systems  Musculoskeletal: Positive for joint pain.       Right knee    Objective:  Physical Exam  Constitutional: He is oriented to person, place, and time. He appears well-developed and well-nourished.  HENT:  Head: Normocephalic and atraumatic.  Eyes: Pupils are equal, round, and reactive to light.  Neck: Normal range of motion.  Cardiovascular: Normal rate and regular rhythm.   Respiratory: Effort normal.  GI: Soft.  Musculoskeletal:  Both knees move about 0-110.  His medial joint line  pain and crepitation on both.  It did not feel an effusion on either side.  Hip motion is full and straight leg raise is negative.    Neurological: He is alert and oriented to person, place, and time.  Skin: Skin is warm and dry.  Psychiatric: He has a normal mood and affect. His behavior is normal. Judgment and thought content normal.    Vital signs in last 24 hours:    Labs:   Estimated body mass index is 47.16 kg/(m^2) as calculated from the following:   Height as of 04/06/13: 5\' 11"  (1.803 m).   Weight as of 04/06/13: 153.316 kg (338 lb).   Imaging Review Plain radiographs demonstrate severe degenerative joint disease of the  right knee(s). The overall alignment ismild varus. The bone quality appears to be good for age and reported activity level.  Assessment/Plan:  End stage arthritis, right knee   The patient history, physical examination, clinical judgment of the provider and imaging studies are consistent with end stage degenerative joint disease of the right knee(s) and total knee arthroplasty is deemed medically necessary. The treatment options including medical management, injection therapy arthroscopy and arthroplasty were discussed at length. The risks and benefits of total knee arthroplasty were presented and reviewed. The risks due to aseptic loosening, infection, stiffness, patella tracking problems, thromboembolic complications and other imponderables were discussed. The patient acknowledged the explanation, agreed to proceed with the plan and consent was signed. Patient is being admitted for inpatient treatment for surgery, pain control, PT, OT, prophylactic antibiotics, VTE prophylaxis, progressive ambulation and ADL's and discharge planning. The patient is planning to be discharged home with home health services

## 2014-06-10 MED ORDER — DEXTROSE 5 % IV SOLN
3.0000 g | INTRAVENOUS | Status: AC
  Administered 2014-06-11: 3 g via INTRAVENOUS
  Filled 2014-06-10: qty 3000

## 2014-06-11 ENCOUNTER — Encounter (HOSPITAL_COMMUNITY): Payer: BC Managed Care – PPO | Admitting: Vascular Surgery

## 2014-06-11 ENCOUNTER — Encounter (HOSPITAL_COMMUNITY): Payer: Self-pay | Admitting: Surgery

## 2014-06-11 ENCOUNTER — Inpatient Hospital Stay (HOSPITAL_COMMUNITY)
Admission: RE | Admit: 2014-06-11 | Discharge: 2014-06-14 | DRG: 470 | Disposition: A | Discharge status: Home Health | Payer: BC Managed Care – PPO | Source: Ambulatory Visit | Attending: Orthopaedic Surgery | Admitting: Orthopaedic Surgery

## 2014-06-11 ENCOUNTER — Ambulatory Visit (HOSPITAL_COMMUNITY): Payer: BC Managed Care – PPO | Admitting: Anesthesiology

## 2014-06-11 ENCOUNTER — Encounter (HOSPITAL_COMMUNITY)
Admission: RE | Disposition: A | Discharge status: Home Health | Payer: Self-pay | Source: Ambulatory Visit | Attending: Orthopaedic Surgery

## 2014-06-11 DIAGNOSIS — Z6841 Body Mass Index (BMI) 40.0 and over, adult: Secondary | ICD-10-CM

## 2014-06-11 DIAGNOSIS — M949 Disorder of cartilage, unspecified: Secondary | ICD-10-CM

## 2014-06-11 DIAGNOSIS — M899 Disorder of bone, unspecified: Secondary | ICD-10-CM | POA: Diagnosis present

## 2014-06-11 DIAGNOSIS — M1710 Unilateral primary osteoarthritis, unspecified knee: Principal | ICD-10-CM | POA: Diagnosis present

## 2014-06-11 DIAGNOSIS — G4733 Obstructive sleep apnea (adult) (pediatric): Secondary | ICD-10-CM | POA: Diagnosis present

## 2014-06-11 DIAGNOSIS — Z883 Allergy status to other anti-infective agents status: Secondary | ICD-10-CM

## 2014-06-11 DIAGNOSIS — H409 Unspecified glaucoma: Secondary | ICD-10-CM | POA: Diagnosis present

## 2014-06-11 DIAGNOSIS — M1711 Unilateral primary osteoarthritis, right knee: Secondary | ICD-10-CM

## 2014-06-11 DIAGNOSIS — K7689 Other specified diseases of liver: Secondary | ICD-10-CM | POA: Diagnosis present

## 2014-06-11 DIAGNOSIS — K219 Gastro-esophageal reflux disease without esophagitis: Secondary | ICD-10-CM | POA: Diagnosis present

## 2014-06-11 DIAGNOSIS — I10 Essential (primary) hypertension: Secondary | ICD-10-CM | POA: Diagnosis present

## 2014-06-11 HISTORY — PX: TOTAL KNEE ARTHROPLASTY: SHX125

## 2014-06-11 HISTORY — DX: Hypertrophic scar: L91.0

## 2014-06-11 HISTORY — DX: Morbid (severe) obesity due to excess calories: E66.01

## 2014-06-11 SURGERY — ARTHROPLASTY, KNEE, TOTAL
Anesthesia: General | Site: Knee | Laterality: Right

## 2014-06-11 MED ORDER — DOCUSATE SODIUM 100 MG PO CAPS
100.0000 mg | ORAL_CAPSULE | Freq: Two times a day (BID) | ORAL | Status: DC
  Administered 2014-06-12 – 2014-06-14 (×5): 100 mg via ORAL
  Filled 2014-06-11 (×7): qty 1

## 2014-06-11 MED ORDER — FENTANYL CITRATE 0.05 MG/ML IJ SOLN
INTRAMUSCULAR | Status: AC
  Filled 2014-06-11: qty 5

## 2014-06-11 MED ORDER — ASPIRIN EC 325 MG PO TBEC
325.0000 mg | DELAYED_RELEASE_TABLET | Freq: Two times a day (BID) | ORAL | Status: DC
  Administered 2014-06-12 – 2014-06-14 (×5): 325 mg via ORAL
  Filled 2014-06-11 (×7): qty 1

## 2014-06-11 MED ORDER — METOPROLOL SUCCINATE ER 25 MG PO TB24
25.0000 mg | ORAL_TABLET | Freq: Every day | ORAL | Status: DC
  Administered 2014-06-11 – 2014-06-13 (×3): 25 mg via ORAL
  Filled 2014-06-11 (×4): qty 1

## 2014-06-11 MED ORDER — BISACODYL 5 MG PO TBEC
5.0000 mg | DELAYED_RELEASE_TABLET | Freq: Every day | ORAL | Status: DC | PRN
  Administered 2014-06-12 – 2014-06-13 (×2): 5 mg via ORAL
  Filled 2014-06-11 (×2): qty 1

## 2014-06-11 MED ORDER — LACTATED RINGERS IV SOLN: INTRAVENOUS | Status: DC

## 2014-06-11 MED ORDER — ACETAMINOPHEN 325 MG PO TABS: 650.0000 mg | ORAL_TABLET | Freq: Four times a day (QID) | ORAL | Status: DC | PRN

## 2014-06-11 MED ORDER — SODIUM CHLORIDE 0.9 % IR SOLN
Status: DC | PRN
  Administered 2014-06-11: 1000 mL

## 2014-06-11 MED ORDER — METHOCARBAMOL 1000 MG/10ML IJ SOLN: 500.0000 mg | Freq: Four times a day (QID) | INTRAVENOUS | Status: DC | PRN

## 2014-06-11 MED ORDER — DARIFENACIN HYDROBROMIDE ER 7.5 MG PO TB24: 7.5000 mg | ORAL_TABLET | Freq: Every day | ORAL | Status: DC

## 2014-06-11 MED ORDER — PANTOPRAZOLE SODIUM 40 MG PO TBEC
40.0000 mg | DELAYED_RELEASE_TABLET | Freq: Every day | ORAL | Status: DC
  Administered 2014-06-12 – 2014-06-14 (×3): 40 mg via ORAL
  Filled 2014-06-11 (×3): qty 1

## 2014-06-11 MED ORDER — FOLIC ACID 1 MG PO TABS
1.0000 mg | ORAL_TABLET | Freq: Every day | ORAL | Status: DC
  Administered 2014-06-11 – 2014-06-13 (×3): 1 mg via ORAL
  Filled 2014-06-11 (×4): qty 1

## 2014-06-11 MED ORDER — LIDOCAINE HCL (CARDIAC) 20 MG/ML IV SOLN
INTRAVENOUS | Status: AC
  Filled 2014-06-11: qty 5

## 2014-06-11 MED ORDER — OXYCODONE HCL 5 MG PO TABS: 5.0000 mg | ORAL_TABLET | Freq: Once | ORAL | Status: DC | PRN

## 2014-06-11 MED ORDER — ONDANSETRON HCL 4 MG/2ML IJ SOLN
INTRAMUSCULAR | Status: AC
  Filled 2014-06-11: qty 2

## 2014-06-11 MED ORDER — TIMOLOL MALEATE 0.5 % OP SOLN
1.0000 [drp] | Freq: Two times a day (BID) | OPHTHALMIC | Status: DC
  Administered 2014-06-11 – 2014-06-14 (×6): 1 [drp] via OPHTHALMIC
  Filled 2014-06-11 (×3): qty 5

## 2014-06-11 MED ORDER — ONDANSETRON HCL 4 MG/2ML IJ SOLN
INTRAMUSCULAR | Status: DC | PRN
  Administered 2014-06-11: 4 mg via INTRAVENOUS

## 2014-06-11 MED ORDER — EPHEDRINE SULFATE 50 MG/ML IJ SOLN
INTRAMUSCULAR | Status: DC | PRN
  Administered 2014-06-11: 5 mg via INTRAVENOUS

## 2014-06-11 MED ORDER — GLYCOPYRROLATE 0.2 MG/ML IJ SOLN
INTRAMUSCULAR | Status: AC
  Filled 2014-06-11: qty 2

## 2014-06-11 MED ORDER — LACTATED RINGERS IV SOLN
INTRAVENOUS | Status: DC
  Administered 2014-06-11: 20:00:00 via INTRAVENOUS

## 2014-06-11 MED ORDER — PROPOFOL 10 MG/ML IV BOLUS
INTRAVENOUS | Status: DC | PRN
  Administered 2014-06-11: 50 mg via INTRAVENOUS
  Administered 2014-06-11: 310 mg via INTRAVENOUS

## 2014-06-11 MED ORDER — MIDAZOLAM HCL 2 MG/2ML IJ SOLN
2.0000 mg | Freq: Once | INTRAMUSCULAR | Status: AC
  Administered 2014-06-11: 2 mg via INTRAVENOUS

## 2014-06-11 MED ORDER — DEXTROSE 5 % IV SOLN
3.0000 g | Freq: Four times a day (QID) | INTRAVENOUS | Status: AC
  Administered 2014-06-11 – 2014-06-12 (×2): 3 g via INTRAVENOUS
  Filled 2014-06-11 (×2): qty 3000

## 2014-06-11 MED ORDER — FENTANYL CITRATE 0.05 MG/ML IJ SOLN
INTRAMUSCULAR | Status: DC | PRN
  Administered 2014-06-11 (×2): 50 ug via INTRAVENOUS
  Administered 2014-06-11: 100 ug via INTRAVENOUS
  Administered 2014-06-11 (×2): 50 ug via INTRAVENOUS

## 2014-06-11 MED ORDER — METHOCARBAMOL 500 MG PO TABS
500.0000 mg | ORAL_TABLET | Freq: Four times a day (QID) | ORAL | Status: DC | PRN
  Administered 2014-06-11 – 2014-06-14 (×9): 500 mg via ORAL
  Filled 2014-06-11 (×11): qty 1

## 2014-06-11 MED ORDER — MIRABEGRON ER 50 MG PO TB24
50.0000 mg | ORAL_TABLET | Freq: Every day | ORAL | Status: DC
  Administered 2014-06-12 – 2014-06-14 (×3): 50 mg via ORAL
  Filled 2014-06-11 (×4): qty 1

## 2014-06-11 MED ORDER — SODIUM CHLORIDE 0.9 % IR SOLN
Status: DC | PRN
  Administered 2014-06-11: 3000 mL

## 2014-06-11 MED ORDER — NEOSTIGMINE METHYLSULFATE 10 MG/10ML IV SOLN
INTRAVENOUS | Status: AC
  Filled 2014-06-11: qty 1

## 2014-06-11 MED ORDER — OXYCODONE HCL 5 MG/5ML PO SOLN: 5.0000 mg | Freq: Once | ORAL | Status: DC | PRN

## 2014-06-11 MED ORDER — MIDAZOLAM HCL 2 MG/2ML IJ SOLN
INTRAMUSCULAR | Status: AC
  Administered 2014-06-11: 2 mg via INTRAVENOUS
  Filled 2014-06-11: qty 2

## 2014-06-11 MED ORDER — HYDROCODONE-ACETAMINOPHEN 5-325 MG PO TABS
1.0000 | ORAL_TABLET | ORAL | Status: DC | PRN
  Administered 2014-06-11 – 2014-06-14 (×14): 2 via ORAL
  Filled 2014-06-11 (×16): qty 2

## 2014-06-11 MED ORDER — ACETAMINOPHEN 650 MG RE SUPP: 650.0000 mg | Freq: Four times a day (QID) | RECTAL | Status: DC | PRN

## 2014-06-11 MED ORDER — MENTHOL 3 MG MT LOZG: 1.0000 | LOZENGE | OROMUCOSAL | Status: DC | PRN

## 2014-06-11 MED ORDER — PHENOL 1.4 % MT LIQD: 1.0000 | OROMUCOSAL | Status: DC | PRN

## 2014-06-11 MED ORDER — SODIUM CHLORIDE 0.9 % IJ SOLN
INTRAMUSCULAR | Status: DC | PRN
  Administered 2014-06-11: 20 mL

## 2014-06-11 MED ORDER — NEOSTIGMINE METHYLSULFATE 10 MG/10ML IV SOLN
INTRAVENOUS | Status: DC | PRN
  Administered 2014-06-11: 3 mg via INTRAVENOUS

## 2014-06-11 MED ORDER — FENTANYL CITRATE 0.05 MG/ML IJ SOLN
INTRAMUSCULAR | Status: AC
  Administered 2014-06-11: 100 ug via INTRAVENOUS
  Filled 2014-06-11: qty 2

## 2014-06-11 MED ORDER — ONDANSETRON HCL 4 MG/2ML IJ SOLN: 4.0000 mg | Freq: Four times a day (QID) | INTRAMUSCULAR | Status: DC | PRN

## 2014-06-11 MED ORDER — ONDANSETRON HCL 4 MG PO TABS: 4.0000 mg | ORAL_TABLET | Freq: Four times a day (QID) | ORAL | Status: DC | PRN

## 2014-06-11 MED ORDER — LACTATED RINGERS IV SOLN
INTRAVENOUS | Status: DC
  Administered 2014-06-11 (×2): via INTRAVENOUS

## 2014-06-11 MED ORDER — ALUM & MAG HYDROXIDE-SIMETH 200-200-20 MG/5ML PO SUSP: 30.0000 mL | ORAL | Status: DC | PRN

## 2014-06-11 MED ORDER — PROPOFOL 10 MG/ML IV BOLUS
INTRAVENOUS | Status: AC
  Filled 2014-06-11: qty 20

## 2014-06-11 MED ORDER — METOCLOPRAMIDE HCL 5 MG/ML IJ SOLN: 5.0000 mg | Freq: Three times a day (TID) | INTRAMUSCULAR | Status: DC | PRN

## 2014-06-11 MED ORDER — DARIFENACIN HYDROBROMIDE ER 7.5 MG PO TB24
7.5000 mg | ORAL_TABLET | Freq: Every morning | ORAL | Status: DC
  Administered 2014-06-12 – 2014-06-14 (×3): 7.5 mg via ORAL
  Filled 2014-06-11 (×3): qty 1

## 2014-06-11 MED ORDER — LATANOPROST 0.005 % OP SOLN
1.0000 [drp] | Freq: Every day | OPHTHALMIC | Status: DC
  Administered 2014-06-11 – 2014-06-13 (×3): 1 [drp] via OPHTHALMIC
  Filled 2014-06-11: qty 2.5

## 2014-06-11 MED ORDER — GLYCOPYRROLATE 0.2 MG/ML IJ SOLN
INTRAMUSCULAR | Status: DC | PRN
  Administered 2014-06-11: 0.4 mg via INTRAVENOUS

## 2014-06-11 MED ORDER — ROCURONIUM BROMIDE 100 MG/10ML IV SOLN
INTRAVENOUS | Status: DC | PRN
  Administered 2014-06-11: 50 mg via INTRAVENOUS

## 2014-06-11 MED ORDER — VITAMIN C 500 MG PO TABS
2000.0000 mg | ORAL_TABLET | Freq: Every day | ORAL | Status: DC
  Administered 2014-06-12 – 2014-06-14 (×3): 2000 mg via ORAL
  Filled 2014-06-11 (×4): qty 4

## 2014-06-11 MED ORDER — BUPIVACAINE LIPOSOME 1.3 % IJ SUSP
20.0000 mL | Freq: Once | INTRAMUSCULAR | Status: AC
  Administered 2014-06-11: 20 mL
  Filled 2014-06-11: qty 20

## 2014-06-11 MED ORDER — TRANEXAMIC ACID 100 MG/ML IV SOLN
1000.0000 mg | INTRAVENOUS | Status: AC
  Administered 2014-06-11: 1000 mg via INTRAVENOUS
  Filled 2014-06-11: qty 10

## 2014-06-11 MED ORDER — HYDROMORPHONE HCL PF 1 MG/ML IJ SOLN
0.5000 mg | INTRAMUSCULAR | Status: DC | PRN
  Administered 2014-06-12 (×5): 1 mg via INTRAVENOUS
  Filled 2014-06-11 (×5): qty 1

## 2014-06-11 MED ORDER — DIPHENHYDRAMINE HCL 12.5 MG/5ML PO ELIX: 12.5000 mg | ORAL_SOLUTION | ORAL | Status: DC | PRN

## 2014-06-11 MED ORDER — HYDROMORPHONE HCL PF 1 MG/ML IJ SOLN: 0.2500 mg | INTRAMUSCULAR | Status: DC | PRN

## 2014-06-11 MED ORDER — ONDANSETRON HCL 4 MG/2ML IJ SOLN: 4.0000 mg | Freq: Once | INTRAMUSCULAR | Status: DC | PRN

## 2014-06-11 MED ORDER — FENTANYL CITRATE 0.05 MG/ML IJ SOLN
100.0000 ug | Freq: Once | INTRAMUSCULAR | Status: AC
  Administered 2014-06-11: 100 ug via INTRAVENOUS

## 2014-06-11 MED ORDER — METOCLOPRAMIDE HCL 10 MG PO TABS: 5.0000 mg | ORAL_TABLET | Freq: Three times a day (TID) | ORAL | Status: DC | PRN

## 2014-06-11 SURGICAL SUPPLY — 70 items
APL SKNCLS STERI-STRIP NONHPOA (GAUZE/BANDAGES/DRESSINGS) ×1
BANDAGE ELASTIC 4 VELCRO ST LF (GAUZE/BANDAGES/DRESSINGS) ×3 IMPLANT
BANDAGE ESMARK 6X9 LF (GAUZE/BANDAGES/DRESSINGS) ×1 IMPLANT
BENZOIN TINCTURE PRP APPL 2/3 (GAUZE/BANDAGES/DRESSINGS) ×2 IMPLANT
BLADE SAG 18X100X1.27 (BLADE) ×2 IMPLANT
BLADE SAGITTAL 25.0X1.19X90 (BLADE) ×2 IMPLANT
BLADE SAGITTAL 25.0X1.19X90MM (BLADE) ×1
BLADE SURG 10 STRL SS (BLADE) ×2 IMPLANT
BLADE SURG ROTATE 9660 (MISCELLANEOUS) IMPLANT
BNDG CMPR 9X6 STRL LF SNTH (GAUZE/BANDAGES/DRESSINGS) ×1
BNDG CMPR MED 10X6 ELC LF (GAUZE/BANDAGES/DRESSINGS) ×1
BNDG COHESIVE 4X5 TAN STRL (GAUZE/BANDAGES/DRESSINGS) ×2 IMPLANT
BNDG ELASTIC 6X10 VLCR STRL LF (GAUZE/BANDAGES/DRESSINGS) ×3 IMPLANT
BNDG ESMARK 6X9 LF (GAUZE/BANDAGES/DRESSINGS) ×3
BNDG GAUZE ELAST 4 BULKY (GAUZE/BANDAGES/DRESSINGS) ×4 IMPLANT
BOWL SMART MIX CTS (DISPOSABLE) ×3 IMPLANT
CAP KNEE ATTUNE RP ×2 IMPLANT
CEMENT HV SMART SET (Cement) ×6 IMPLANT
CLOSURE STERI-STRIP 1/2X4 (GAUZE/BANDAGES/DRESSINGS) ×1
CLSR STERI-STRIP ANTIMIC 1/2X4 (GAUZE/BANDAGES/DRESSINGS) ×1 IMPLANT
COVER SURGICAL LIGHT HANDLE (MISCELLANEOUS) ×3 IMPLANT
CUFF TOURNIQUET SINGLE 34IN LL (TOURNIQUET CUFF) ×1 IMPLANT
CUFF TOURNIQUET SINGLE 44IN (TOURNIQUET CUFF) ×2 IMPLANT
DRAPE EXTREMITY T 121X128X90 (DRAPE) ×3 IMPLANT
DRAPE PROXIMA HALF (DRAPES) ×3 IMPLANT
DRAPE U-SHAPE 47X51 STRL (DRAPES) ×3 IMPLANT
DRSG ADAPTIC 3X8 NADH LF (GAUZE/BANDAGES/DRESSINGS) ×3 IMPLANT
DRSG PAD ABDOMINAL 8X10 ST (GAUZE/BANDAGES/DRESSINGS) ×3 IMPLANT
DURAPREP 26ML APPLICATOR (WOUND CARE) ×3 IMPLANT
ELECT REM PT RETURN 9FT ADLT (ELECTROSURGICAL) ×3
ELECTRODE REM PT RTRN 9FT ADLT (ELECTROSURGICAL) ×1 IMPLANT
GAUZE SPONGE 4X4 12PLY STRL (GAUZE/BANDAGES/DRESSINGS) ×3 IMPLANT
GLOVE BIO SURGEON STRL SZ8 (GLOVE) ×6 IMPLANT
GLOVE BIOGEL PI IND STRL 8 (GLOVE) ×2 IMPLANT
GLOVE BIOGEL PI INDICATOR 8 (GLOVE) ×4
GOWN STRL REUS W/ TWL LRG LVL3 (GOWN DISPOSABLE) ×1 IMPLANT
GOWN STRL REUS W/ TWL XL LVL3 (GOWN DISPOSABLE) ×2 IMPLANT
GOWN STRL REUS W/TWL 2XL LVL3 (GOWN DISPOSABLE) ×3 IMPLANT
GOWN STRL REUS W/TWL LRG LVL3 (GOWN DISPOSABLE) ×3
GOWN STRL REUS W/TWL XL LVL3 (GOWN DISPOSABLE) ×6
HANDPIECE INTERPULSE COAX TIP (DISPOSABLE) ×3
HOOD PEEL AWAY FACE SHEILD DIS (HOOD) ×6 IMPLANT
IMMOBILIZER KNEE 20 (SOFTGOODS) IMPLANT
IMMOBILIZER KNEE 22 (SOFTGOODS) ×2 IMPLANT
IMMOBILIZER KNEE 22 UNIV (SOFTGOODS) ×1 IMPLANT
IMMOBILIZER KNEE 24 THIGH 36 (MISCELLANEOUS) IMPLANT
IMMOBILIZER KNEE 24 UNIV (MISCELLANEOUS) ×3
KIT BASIN OR (CUSTOM PROCEDURE TRAY) ×3 IMPLANT
KIT ROOM TURNOVER OR (KITS) ×3 IMPLANT
MANIFOLD NEPTUNE II (INSTRUMENTS) ×3 IMPLANT
NDL HYPO 21X1 ECLIPSE (NEEDLE) ×1 IMPLANT
NEEDLE HYPO 21X1 ECLIPSE (NEEDLE) ×3 IMPLANT
NS IRRIG 1000ML POUR BTL (IV SOLUTION) ×3 IMPLANT
PACK TOTAL JOINT (CUSTOM PROCEDURE TRAY) ×3 IMPLANT
PAD ARMBOARD 7.5X6 YLW CONV (MISCELLANEOUS) ×6 IMPLANT
SET HNDPC FAN SPRY TIP SCT (DISPOSABLE) ×1 IMPLANT
SPONGE GAUZE 4X4 12PLY STER LF (GAUZE/BANDAGES/DRESSINGS) ×2 IMPLANT
STAPLER VISISTAT 35W (STAPLE) IMPLANT
SUCTION FRAZIER TIP 10 FR DISP (SUCTIONS) IMPLANT
SUT MNCRL AB 3-0 PS2 18 (SUTURE) ×2 IMPLANT
SUT VIC AB 0 CT1 27 (SUTURE) ×6
SUT VIC AB 0 CT1 27XBRD ANBCTR (SUTURE) ×2 IMPLANT
SUT VIC AB 2-0 CT1 27 (SUTURE) ×6
SUT VIC AB 2-0 CT1 TAPERPNT 27 (SUTURE) ×2 IMPLANT
SUT VLOC 180 0 24IN GS25 (SUTURE) ×3 IMPLANT
SYR 50ML LL SCALE MARK (SYRINGE) ×3 IMPLANT
TOWEL OR 17X24 6PK STRL BLUE (TOWEL DISPOSABLE) ×3 IMPLANT
TOWEL OR 17X26 10 PK STRL BLUE (TOWEL DISPOSABLE) ×3 IMPLANT
TRAY FOLEY CATH 14FR (SET/KITS/TRAYS/PACK) IMPLANT
WATER STERILE IRR 1000ML POUR (IV SOLUTION) ×2 IMPLANT

## 2014-06-11 NOTE — Anesthesia Preprocedure Evaluation (Addendum)
Anesthesia Evaluation  Patient identified by MRN, date of birth, ID band Patient awake    Reviewed: Allergy & Precautions, H&P , NPO status , Patient's Chart, lab work & pertinent test results  Airway Mallampati: II TM Distance: >3 FB Neck ROM: Full    Dental  (+) Teeth Intact, Dental Advisory Given   Pulmonary  breath sounds clear to auscultation        Cardiovascular hypertension, Rhythm:Regular Rate:Normal     Neuro/Psych    GI/Hepatic   Endo/Other    Renal/GU      Musculoskeletal   Abdominal (+) + obese,   Peds  Hematology   Anesthesia Other Findings   Reproductive/Obstetrics                          Anesthesia Physical Anesthesia Plan  ASA: III  Anesthesia Plan: General   Post-op Pain Management:    Induction: Intravenous  Airway Management Planned: Oral ETT  Additional Equipment:   Intra-op Plan:   Post-operative Plan: Extubation in OR  Informed Consent: I have reviewed the patients History and Physical, chart, labs and discussed the procedure including the risks, benefits and alternatives for the proposed anesthesia with the patient or authorized representative who has indicated his/her understanding and acceptance.   Dental advisory given  Plan Discussed with: CRNA and Anesthesiologist  Anesthesia Plan Comments: (Obesity Hypertension Sleep apnea  Plan GA with oral ETT and adductor canal block.  Roberts Gaudy, mD)        Anesthesia Quick Evaluation

## 2014-06-11 NOTE — Anesthesia Postprocedure Evaluation (Signed)
  Anesthesia Post-op Note  Patient: Cameron [name]  Procedure(s) Performed: Procedure(s): TOTAL KNEE ARTHROPLASTY (Right)  Patient Location: PACU  Anesthesia Type:General and GA combined with regional for post-op pain  Level of Consciousness: awake, alert  and oriented  Airway and Oxygen Therapy: Patient Spontanous Breathing and Patient connected to nasal cannula oxygen  Post-op Pain: mild  Post-op Assessment: Post-op Vital signs reviewed, Patient's Cardiovascular Status Stable, Respiratory Function Stable, Patent Airway and Pain level controlled  Post-op Vital Signs: stable  Last Vitals:  Filed Vitals:   06/11/14 1830  BP: 144/87  Pulse: 74  Temp:   Resp: 16    Complications: No apparent anesthesia complications

## 2014-06-11 NOTE — Transfer of Care (Signed)
Immediate Anesthesia Transfer of Care Note  Patient: Cameron [name]  Procedure(s) Performed: Procedure(s): TOTAL KNEE ARTHROPLASTY (Right)  Patient Location: PACU  Anesthesia Type:GA combined with regional for post-op pain  Level of Consciousness: awake, alert , oriented and patient cooperative  Airway & Oxygen Therapy: Patient Spontanous Breathing and Patient connected to face mask oxygen  Post-op Assessment: Report given to PACU RN, Post -op Vital signs reviewed and stable and Patient moving all extremities  Post vital signs: Reviewed and stable  Complications: No apparent anesthesia complications

## 2014-06-11 NOTE — Progress Notes (Signed)
Patient has an IV in his left hand and has fluids infusing. Dressing clean dry and intact

## 2014-06-11 NOTE — Anesthesia Procedure Notes (Addendum)
Anesthesia Regional Block:  Adductor canal block  Pre-Anesthetic Checklist: ,, timeout performed, Correct Patient, Correct Site, Correct Laterality, Correct Procedure, Correct Position, site marked, Risks and benefits discussed,  Surgical consent,  Pre-op evaluation,  At surgeon's request and post-op pain management  Laterality: Right  Prep: chloraprep       Needles:  Injection technique: Single-shot  Needle Type: Echogenic Stimulator Needle     Needle Length: 9cm 9 cm Needle Gauge: 22 and 22 G    Additional Needles:  Procedures: ultrasound guided (picture in chart) Adductor canal block Narrative:  Start time: 06/11/2014 12:50 PM End time: 06/11/2014 12:55 PM Injection made incrementally with aspirations every 5 mL.  Performed by: Personally   Additional Notes: 20 cc 0.5% marcaine with 1:200 Epi injected easily   Procedure Name: Intubation Date/Time: 06/11/2014 1:46 PM Performed by: Julian Reil Pre-anesthesia Checklist: Patient identified, Emergency Drugs available, Suction available and Patient being monitored Patient Re-evaluated:Patient Re-evaluated prior to inductionOxygen Delivery Method: Circle system utilized Preoxygenation: Pre-oxygenation with 100% oxygen Intubation Type: IV induction Ventilation: Mask ventilation without difficulty and Oral airway inserted - appropriate to patient size Laryngoscope Size: Mac and 4 Grade View: Grade I Tube type: Oral Tube size: 7.5 mm Number of attempts: 1 Airway Equipment and Method: Stylet Placement Confirmation: ETT inserted through vocal cords under direct vision,  positive ETCO2 and breath sounds checked- equal and bilateral Secured at: 23 cm Tube secured with: Tape Dental Injury: Teeth and Oropharynx as per pre-operative assessment

## 2014-06-11 NOTE — Anesthesia Postprocedure Evaluation (Signed)
  Anesthesia Post-op Note  Patient: Cameron [name]  Procedure(s) Performed: Procedure(s): TOTAL KNEE ARTHROPLASTY (Right)  Patient Location: PACU  Anesthesia Type: General   Level of Consciousness: awake, alert  and oriented  Airway and Oxygen Therapy: Patient Spontanous Breathing  Post-op Pain: none  Post-op Assessment: Post-op Vital signs reviewed  Post-op Vital Signs: Reviewed  Last Vitals:  Filed Vitals:   06/11/14 1800  BP: 151/80  Pulse:   Temp:   Resp:     Complications: No apparent anesthesia complications

## 2014-06-11 NOTE — Progress Notes (Signed)
Orthopedic Tech Progress Note Patient Details:  Cameron [name] January 06, 1946 [id number]  CPM Right Knee CPM Right Knee: On Right Knee Flexion (Degrees): 60 Right Knee Extension (Degrees): 0 Additional Comments: applied ohf to bed and footsie roll      Braulio Bosch 06/11/2014, 5:12 PM

## 2014-06-11 NOTE — Op Note (Signed)
PREOP DIAGNOSIS: DJD RIGHT KNEE POSTOP DIAGNOSIS: same PROCEDURE: RIGHT TKR ANESTHESIA: General and block ATTENDING SURGEON: Niam Nepomuceno G ASSISTANT: Loni Dolly PA  INDICATIONS FOR PROCEDURE: Cameron [name] is a 68 y.o. male who has struggled for a long time with pain due to degenerative arthritis of the right knee.  The patient has failed many conservative non-operative measures and at this point has pain which limits the ability to sleep and walk.  The patient is offered total knee replacement.  Informed operative consent was obtained after discussion of possible risks of anesthesia, infection, neurovascular injury, DVT, and death.  The importance of the post-operative rehabilitation protocol to optimize result was stressed extensively with the patient.  SUMMARY OF FINDINGS AND PROCEDURE:  IVA POSTEN was taken to the operative suite where under the above anesthesia a right knee replacement was performed.  There were advanced degenerative changes and the bone quality was excellent.  We used the DePuy Attune system and placed size 8 right femur, 10 tibia, 41 mm all polyethylene patella, and a size 5 mm spacer.  Loni Dolly PA-C assisted throughout and was invaluable to the completion of the case in that he helped retract and maintain exposure while I placed components.  He also helped close thereby minimizing OR time.  The patient was admitted for appropriate post-op care to include perioperative antibiotics and mechanical and pharmacologic measures for DVT prophylaxis.  DESCRIPTION OF PROCEDURE:  PANCHO RUSHING was taken to the operative suite where the above anesthesia was applied.  The patient was positioned supine and prepped and draped in normal sterile fashion.  An appropriate time out was performed.  After the administration of kefzol pre-op antibiotic the leg was elevated and exsanguinated and a tourniquet inflated. A standard longitudinal incision was made on the anterior  knee.  Dissection was carried down to the extensor mechanism.  All appropriate anti-infective measures were used including the pre-operative antibiotic, betadine impregnated drape, and closed hooded exhaust systems for each member of the surgical team.  A medial parapatellar incision was made in the extensor mechanism and the knee cap flipped and the knee flexed.  Some residual meniscal tissues were removed along with any remaining ACL/PCL tissue.  A guide was placed on the tibia and a flat cut was made on it's superior surface.  An intramedullary guide was placed in the femur and was utilized to make a distal cut creating an appropriate extension gap.  A second intramedullary guide was placed in the femur to make a anterior and posterior cuts properly balancing the knee with an extension gap equal to the flexion gap.  The three bones sized to the above mentioned sizes and the appropriate guides were placed and utilized.  A trial reduction was done and the knee easily came to full extension and the patella tracked well on flexion.  The trial components were removed and all bones were cleaned with pulsatile lavage and then dried thoroughly.  Cement was mixed and was pressurized onto the bones followed by placement of the aforementioned components.  Excess cement was trimmed and pressure was held on the components until the cement had hardened.  The tourniquet was deflated and a small amount of bleeding was controlled with cautery and pressure.  The knee was irrigated thoroughly.  The extensor mechanism was re-approximated with V-loc suture in running fashion.  The knee was flexed and the repair was solid.  The subcutaneous tissues were re-approximated with #0 and #2-0 vicryl and the skin closed  with a subcuticular stitch and steristrips.  A sterile dressing was applied.  Intraoperative fluids, EBL, and tourniquet time can be obtained from anesthesia records.  DISPOSITION:  The patient was taken to recovery room in  stable condition and admitted for appropriate post-op care to include peri-operative antibiotic and DVT prophylaxis with mechanical and pharmacologic measures.  Chrissy Ealey G 06/11/2014, 3:58 PM

## 2014-06-11 NOTE — Interval H&P Note (Signed)
History and Physical Interval Note:  06/11/2014 12:42 PM  Cameron [name]  has presented today for surgery, with the diagnosis of RIGHT KNEE DEGENERATIVE JOINT DISEASE  The various methods of treatment have been discussed with the patient and family. After consideration of risks, benefits and other options for treatment, the patient has consented to  Procedure(s): TOTAL KNEE ARTHROPLASTY (Right) as a surgical intervention .  The patient's history has been reviewed, patient examined, no change in status, stable for surgery.  I have reviewed the patient's chart and labs.  Questions were answered to the patient's satisfaction.     [name]

## 2014-06-12 LAB — BASIC METABOLIC PANEL
Anion gap: 10 (ref 5–15)
BUN: 10 mg/dL (ref 6–23)
CO2: 28 mEq/L (ref 19–32)
Calcium: 8.3 mg/dL — ABNORMAL LOW (ref 8.4–10.5)
Chloride: 103 mEq/L (ref 96–112)
Creatinine, Ser: 0.87 mg/dL (ref 0.50–1.35)
GFR calc Af Amer: >90 mL/min (ref >90)
GFR calc non Af Amer: 89 mL/min — ABNORMAL LOW (ref >90)
Glucose, Bld: 119 mg/dL — ABNORMAL HIGH (ref 70–99)
Potassium: 4.1 mEq/L (ref 3.7–5.3)
Sodium: 141 mEq/L (ref 137–147)

## 2014-06-12 LAB — CBC
HCT: 36.4 % — ABNORMAL LOW (ref 39.0–52.0)
Hemoglobin: 11.7 g/dL — ABNORMAL LOW (ref 13.0–17.0)
MCH: 29 pg (ref 26.0–34.0)
MCHC: 32.1 g/dL (ref 30.0–36.0)
MCV: 90.3 fL (ref 78.0–100.0)
Platelets: 200 10*3/uL (ref 150–400)
RBC: 4.03 MIL/uL — ABNORMAL LOW (ref 4.22–5.81)
RDW: 13.8 % (ref 11.5–15.5)
WBC: 6.7 10*3/uL (ref 4.0–10.5)

## 2014-06-12 NOTE — Progress Notes (Signed)
Subjective: 1 Day Post-Op Procedure(s) (LRB): TOTAL KNEE ARTHROPLASTY (Right)  Activity level:  wbat Diet tolerance:  Eating well Voiding:  ok Patient reports pain as mild and moderate.    Objective: Vital signs in last 24 hours: Temp:  [97.6 F (36.4 C)-99 F (37.2 C)] 98.5 F (36.9 C) (08/05 0533) Pulse Rate:  [65-90] 88 (08/05 0533) Resp:  [10-21] 16 (08/05 0533) BP: (123-154)/(65-87) 125/65 mmHg (08/05 0533) SpO2:  [94 %-100 %] 96 % (08/05 0533) Weight:  [151.501 kg (334 lb)] 151.501 kg (334 lb) (08/04 1136)  Labs:  Recent Labs  06/12/14 0620  HGB 11.7*    Recent Labs  06/12/14 0620  WBC 6.7  RBC 4.03*  HCT 36.4*  PLT 200    Recent Labs  06/12/14 0620  NA 141  K 4.1  CL 103  CO2 28  BUN 10  CREATININE 0.87  GLUCOSE 119*  CALCIUM 8.3*   No results found for this basename: LABPT, INR,  in the last 72 hours  Physical Exam:  Neurologically intact ABD soft Neurovascular intact Sensation intact distally Intact pulses distally Dorsiflexion/Plantar flexion intact Incision: dressing C/D/I No cellulitis present Compartment soft  Assessment/Plan:  1 Day Post-Op Procedure(s) (LRB): TOTAL KNEE ARTHROPLASTY (Right) Advance diet Up with therapy D/C IV fluids Plan for discharge tomorrow Discharge home with home health Dressing change to mepilex. Continue on ASA 325mg  BID x 2 weeks Follow up in office 2 weeks post op.     Cameron [name], Cameron [name] 06/12/2014, 8:02 AM

## 2014-06-12 NOTE — Progress Notes (Signed)
Utilization review completed.  

## 2014-06-12 NOTE — Progress Notes (Signed)
Physical Therapy Treatment Patient Details Name: Cameron [name] MRN: [id number] DOB: June 12, 1946 Today's Date: 06/12/2014    History of Present Illness  68 y.o. male, has a history of pain and  disability in the right knee due to arthritis with failed conservative management.  Onset of symptoms was 5 years ago with gradually worsening course since that time. The patient noted no past surgery on the right knee.  PMHx:  cubital tunnel syndrome, OSA, diverticulosis, HTN, obesity, GERD, osteopenia, B 12 deficiency, back surgery, glaucoma    PT Comments    Pt progressing well with therapy this afternoon. Pt at supervision for mobility. Spoke with pt and wife regarding D/C disposition; due to progress; disposition updated to HHPT. Patient needs to practice stairs next session prior to D/C home.   Follow Up Recommendations  Home health PT;Supervision/Assistance - 24 hour     Equipment Recommendations       Recommendations for Other Services       Precautions / Restrictions Precautions Precautions: Knee;Fall Precaution Comments: reinforced no pillow under knee  Restrictions Weight Bearing Restrictions: No RLE Weight Bearing: Weight bearing as tolerated    Mobility  Bed Mobility Overal bed mobility: Needs Assistance Bed Mobility: Supine to Sit;Sit to Supine     Supine to sit: Supervision Sit to supine: Supervision   General bed mobility comments: incr time due to pain ; supervision for cues for hand placement;   Transfers Overall transfer level: Needs assistance Equipment used: Rolling walker (2 wheeled) Transfers: Sit to/from Stand Sit to Stand: Supervision         General transfer comment: supervision for cues for hand placement on RW  Ambulation/Gait Ambulation/Gait assistance: Supervision Ambulation Distance (Feet): 150 Feet Assistive device: Rolling walker (2 wheeled) Gait Pattern/deviations: Step-through pattern;Wide base of support;Trunk flexed Gait velocity:  beginning to incr   General Gait Details: cues for upright posture and safety with RW; pt progressing to step through gt consistently    Stairs            Wheelchair Mobility    Modified Rankin (Stroke Patients Only)       Balance   Sitting-balance support: No upper extremity supported Sitting balance-Leahy Scale: Normal     Standing balance support: Bilateral upper extremity supported;No upper extremity supported;During functional activity Standing balance-Leahy Scale: Good (able to stand at sink and perform ADLs)                      Cognition Arousal/Alertness: Awake/alert Behavior During Therapy: WFL for tasks assessed/performed Overall Cognitive Status: Within Functional Limits for tasks assessed                      Exercises      General Comments General comments (skin integrity, edema, etc.): educated on CPM controls for safety; tolerating 65 degrees of flexion       Pertinent Vitals/Pain 5/10; "stiffness" .  patient repositioned for comfort and tolerating [place] expects to be discharged to:: Private residence Living Arrangements: Spouse/significant other   Type of Home: House Home Access: Stairs to enter     [place]: None Additional Comments: Wife concerned as she is recovering from surgery and asks for pt to get some extra help with mobility before coming home    Prior Function Level of Independence: Independent      Comments:  was playing tennis until March 2015, used no AD and still driving  PT Goals (current goals can now be found in the care plan section) Acute Rehab PT Goals Patient Stated Goal: To be as independent as possible. PT Goal Formulation: With patient/family Time For Goal Achievement: 06/19/14 Potential to Achieve Goals: Good Progress towards PT goals: Progressing toward goals    Frequency  7X/week    PT Plan Current plan remains appropriate    Co-evaluation              End of Session   Activity Tolerance: Patient tolerated treatment well Patient left: in CPM;in bed;with call bell/phone within reach;with family/visitor present     Time: 1117-3567 PT Time Calculation (min): 19 min  Charges:  $Gait Training: 8-22 mins                    G CodesGustavus [name], [place] 06/12/2014, 4:39 PM

## 2014-06-12 NOTE — Evaluation (Signed)
Physical Therapy Evaluation Patient Details Name: Cameron [name] MRN: [id number] DOB: 1946/08/12 Today's Date: 06/12/2014   History of Present Illness   68 y.o. male, has a history of pain and  disability in the right knee due to arthritis with failed conservative management.  Onset of symptoms was 5 years ago with gradually worsening course since that time. The patient noted no past surgery on the right knee.  PMHx:  cubital tunnel syndrome, OSA, diverticulosis, HTN, obesity, GERD, osteopenia, B 12 deficiency, back surgery, glaucoma  Clinical Impression  Pt presents with generalized weakness and complaints of discomfort, now he and wife asking about assistance for his discharge.  Spoke with them about SNF care and they are comfortable with discussing this option for him.    Follow Up Recommendations SNF;Supervision/Assistance - 24 hour    Equipment Recommendations       Recommendations for Other Services OT consult     Precautions / Restrictions Precautions Precautions: Knee;Back;Fall Restrictions Weight Bearing Restrictions: Yes RLE Weight Bearing: Weight bearing as tolerated      Mobility  Bed Mobility Overal bed mobility: Needs Assistance Bed Mobility: Rolling;Sidelying to Sit Rolling: Min assist Sidelying to sit: Min assist       General bed mobility comments: Prompting for body mechanics and to sequence, Needed tactile cues as well as vc's  Transfers Overall transfer level: Needs assistance Equipment used: Rolling walker (2 wheeled);1 person hand held assist Transfers: Sit to/from [facility] Sit to Stand: Mod assist Stand pivot transfers: Min assist       General transfer comment: Grab bars in BR were helpful but dense cues for hand placeement and sequencing were needed, tends to forget placement of RLE to stand and sit  Ambulation/Gait Ambulation/Gait assistance: Min assist Ambulation Distance (Feet): 12 Feet Assistive device: Rolling  walker (2 wheeled) Gait Pattern/deviations: Step-to pattern;Decreased dorsiflexion - right;Decreased dorsiflexion - left;Decreased step length - right;Decreased step length - left;Decreased weight shift to right;Wide base of support;Antalgic Gait velocity: slower Gait velocity interpretation: Below normal speed for age/gender    Stairs            Wheelchair Mobility    Modified Rankin (Stroke Patients Only)       Balance Overall balance assessment: Needs assistance Sitting-balance support: Feet supported Sitting balance-Leahy Scale: Fair   Postural control: Posterior lean Standing balance support: Bilateral upper extremity supported Standing balance-Leahy Scale: Fair                               Pertinent Vitals/Pain Pain 8/10, BP 125/65, pulse 88, O2 sat 96%.    Home Living Family/patient expects to be discharged to:: Skilled nursing facility                 Additional Comments: Wife concerned as she is recovering from surgery and asks for pt to get some extra help with mobility before coming home    Prior Function Level of Independence: Independent         Comments:  was playing tennis until March 2015, used no AD and still driving     Hand Dominance   Dominant Hand: Right    Extremity/Trunk Assessment   Upper Extremity Assessment: Overall WFL for tasks assessed           Lower Extremity Assessment: Generalized weakness      Cervical / Trunk Assessment: Normal  Communication   Communication: No difficulties  Cognition Arousal/Alertness:  Awake/alert Behavior During Therapy: WFL for tasks assessed/performed Overall Cognitive Status: Within Functional Limits for tasks assessed                      General Comments General comments (skin integrity, edema, etc.): Ace wrap over Rknee but edematous and discomfort voiced by pt.  Difficulty for pt to control RLE to transition to stand or sit    Exercises         Assessment/Plan    PT Assessment Patient needs continued PT services  PT Diagnosis Difficulty walking   PT Problem List Decreased strength;Decreased range of motion;Decreased activity tolerance;Decreased balance;Decreased mobility;Decreased coordination;Decreased knowledge of use of DME;Decreased safety awareness;Decreased knowledge of precautions;Obesity;Decreased skin integrity;Pain  PT Treatment Interventions DME instruction;Gait training;Stair training;Functional mobility training;Therapeutic activities;Therapeutic exercise;Balance training;Neuromuscular re-education;Patient/family education   PT Goals (Current goals can be found in the Care Plan section) Acute Rehab PT Goals Patient Stated Goal: To walk and go home PT Goal Formulation: With patient/family Time For Goal Achievement: 06/19/14 Potential to Achieve Goals: Good    Frequency 7X/week   Barriers to discharge Inaccessible home environment;Decreased caregiver support Wife is unable to physically assist pt to move and will need inpatient care in SNF likely    Co-evaluation               End of Session Equipment Utilized During Treatment: Other (comment) (FWW) Activity Tolerance: Patient tolerated treatment well Patient left: in chair;with call bell/phone within reach;with family/visitor present Nurse Communication: Mobility status;Patient requests pain meds         Time: 1155-1226 PT Time Calculation (min): 31 min   Charges:   PT Evaluation $Initial PT Evaluation Tier I: 1 Procedure PT Treatments $Gait Training: 8-22 mins   PT G Codes:          Ramond Dial 2014/07/01, 1:16 PM  Mee Hives, PT MS Acute Rehab Dept. Number: [phone]

## 2014-06-12 NOTE — Evaluation (Signed)
Occupational Therapy Evaluation Patient Details Name: Cameron [name] MRN: [id number] DOB: 1946-08-23 Today's Date: 06/12/2014    History of Present Illness  68 y.o. male, has a history of pain and  disability in the right knee due to arthritis with failed conservative management.  Onset of symptoms was 5 years ago with gradually worsening course since that time. The patient noted no past surgery on the right knee.  PMHx:  cubital tunnel syndrome, OSA, diverticulosis, HTN, obesity, GERD, osteopenia, B 12 deficiency, back surgery, glaucoma   Clinical Impression   Pt is currently min guard assist for toilet transfers and toileting, min assist for LB selfcare sit to stand secondary to not being able to fully reach the RLE for donning sock and shoe.  Feel he can reach supervision to modified independent level for selfcare tasks in another 1-2 days for discharge home with wife.  She cannot physically assist pt with lifting but can provide supervision.  Will continue to follow for acute care OT as pt will benefit from further strengthening, education on DME.  No post acute OT warranted.     Follow Up Recommendations  No OT follow up    Equipment Recommendations  3 in 1 bedside comode (wide 3:1)       Precautions / Restrictions Precautions Precautions: Knee;Back;Fall Restrictions Weight Bearing Restrictions: No RLE Weight Bearing: Weight bearing as tolerated      Mobility Bed Mobility Overal bed mobility: Needs Assistance Bed Mobility: Sit to Supine Rolling: Min assist Sidelying to sit: Min assist   Sit to supine: Min guard   General bed mobility comments: Pt able to lay down and lift his RLE up into the bed with only guarding from therapist.  Transfers Overall transfer level: Needs assistance Equipment used: Rolling walker (2 wheeled) Transfers: Sit to/from Stand Sit to Stand: Min guard Stand pivot transfers: Min assist       General transfer comment: Grab bars in BR were  helpful but dense cues for hand placeement and sequencing were needed, tends to forget placement of RLE to stand and sit    Balance Overall balance assessment: Needs assistance Sitting-balance support: No upper extremity supported Sitting balance-Leahy Scale: Normal   Postural control: Posterior lean Standing balance support: Bilateral upper extremity supported Standing balance-Leahy Scale: Fair                              ADL Overall ADL's : Needs assistance/impaired Eating/Feeding: Independent;Sitting   Grooming: Wash/dry hands;Supervision/safety;Standing   Upper Body Bathing: Supervision/ safety;Sitting   Lower Body Bathing: Minimal assistance;Sit to/from stand   Upper Body Dressing : Set up;Sitting   Lower Body Dressing: Minimal assistance;Sit to/from stand   Toilet Transfer: Min guard;RW;Ambulation;Grab TEFL teacher Details (indicate cue type and reason): Pt stood to urinate with min guard assist as well as performing sit to stand from the elevated toilet using the grab bar on the left side. Toileting- Water quality scientist and Hygiene: Min guard;Sit to/from stand       Functional mobility during ADLs: Min guard General ADL Comments: Pt doing very well overall only min guard assist for ambulation to the bathroom for toileting and grooming tasks as well as ambulating to the nurses station and back.  O2 sats decreased to 84-85% on room air with HR increasing to 112.  O2 applied at 2Ls nasal cannula increasing sats back to 92%.        Perception Perception Perception Tested?:  No   Praxis Praxis Praxis tested?: Not tested    Pertinent Vitals/Pain Pain 4/10 in the right knee post activity     Hand Dominance Right   Extremity/Trunk Assessment Upper Extremity Assessment Upper Extremity Assessment: Overall WFL for tasks assessed   Lower Extremity Assessment Lower Extremity Assessment: Defer to PT evaluation   Cervical / Trunk  Assessment Cervical / Trunk Assessment: Normal   Communication Communication Communication: No difficulties   Cognition Arousal/Alertness: Awake/alert Behavior During Therapy: WFL for tasks assessed/performed Overall Cognitive Status: Within Functional Limits for tasks assessed                                Home Living Family/patient expects to be discharged to:: Private residence Living Arrangements: Spouse/significant other   Type of Home: House Home Access: Stairs to enter Technical brewer of Steps: 2-3         Bathroom Shower/Tub: English as a second language teacher characteristics: Architectural technologist: Handicapped height Bathroom Accessibility: Yes   Home Equipment: None   Additional Comments: Wife concerned as she is recovering from surgery and asks for pt to get some extra help with mobility before coming home      Prior Functioning/Environment Level of Independence: Independent        Comments:  was playing tennis until March 2015, used no AD and still driving    OT Diagnosis: Generalized weakness;Acute pain   OT Problem List: Decreased strength;Decreased range of motion;Impaired balance (sitting and/or standing);Pain;Decreased knowledge of use of DME or AE   OT Treatment/Interventions: Self-care/ADL training;Therapeutic activities;DME and/or AE instruction;Patient/family education;Balance training    OT Goals(Current goals can be found in the care plan section) Acute Rehab OT Goals Patient Stated Goal: To be as independent as possible. OT Goal Formulation: With patient Time For Goal Achievement: 06/19/14 Potential to Achieve Goals: Good ADL Goals Pt Will Perform Grooming: with modified independence;standing Pt Will Perform Lower Body Dressing: with supervision;sit to/from stand Pt Will Transfer to Toilet: with modified independence;ambulating;bedside commode;grab bars Pt Will Perform Tub/Shower Transfer: Shower transfer;anterior/posterior  transfer;ambulating;shower seat;3 in 1;with supervision  OT Frequency: Min 2X/week   Barriers to D/C: Decreased caregiver support  wife cannot provide physical assist          End of Session Equipment Utilized During Treatment: Gait belt CPM Right Knee CPM Right Knee: Off Nurse Communication: Mobility status  Activity Tolerance: Patient tolerated treatment well Patient left: in bed;with call bell/phone within reach;with family/visitor present   Time: 9622-2979 OT Time Calculation (min): 36 min Charges:  OT General Charges $OT Visit: 1 Procedure OT Evaluation $Initial OT Evaluation Tier I: 1 Procedure OT Treatments $Self Care/Home Management : 23-37 mins G-Codes:    Shuayb Schepers OTR/L 07/11/2014, 3:11 PM

## 2014-06-12 NOTE — Progress Notes (Signed)
Pt was not ready to go on CPAP at this time and also stated he was able to place himself on when he was ready. RT instructed patient to ask his nurse to call if he needed help later in the evening, nurse was also notified.

## 2014-06-12 NOTE — Care Management Note (Signed)
CARE MANAGEMENT NOTE 06/12/2014  Patient:  Cameron [name], Cameron [name]   Account Number:  [id number]  Date Initiated:  06/12/2014  Documentation initiated by:  Ricki Miller  Subjective/Objective Assessment:   68 yr old male s/p right total knee arthroplasty.     Action/Plan:   Case manager spoke with patient and wife concerning home health and DME needs. Choice offered. Referral called to [facility] liaison.   Anticipated DC Date:  06/14/2014   Anticipated DC Plan:  [place]  CM consult      [facility]  HOME HEALTH  DURABLE MEDICAL EQUIPMENT   Choice offered to / List presented to:  C-1 Patient   DME arranged  [facility]  3-N-1  [facility]      DME agency  [employer]     [place] arranged  HH-2 PT      [place].   Status of service:  Completed, signed off

## 2014-06-13 ENCOUNTER — Encounter (HOSPITAL_COMMUNITY): Payer: Self-pay | Admitting: Orthopaedic Surgery

## 2014-06-13 LAB — CBC
HCT: 33.4 % — ABNORMAL LOW (ref 39.0–52.0)
Hemoglobin: 11 g/dL — ABNORMAL LOW (ref 13.0–17.0)
MCH: 29.1 pg (ref 26.0–34.0)
MCHC: 32.9 g/dL (ref 30.0–36.0)
MCV: 88.4 fL (ref 78.0–100.0)
Platelets: 173 10*3/uL (ref 150–400)
RBC: 3.78 MIL/uL — ABNORMAL LOW (ref 4.22–5.81)
RDW: 13.6 % (ref 11.5–15.5)
WBC: 8.8 10*3/uL (ref 4.0–10.5)

## 2014-06-13 NOTE — Progress Notes (Signed)
OT Cancellation Note  Patient Details Name: Cameron [name] MRN: [id number] DOB: August 08, 1946   Cancelled Treatment:     Attempted to see pt and offered to practice shower transfer. Per PT, pt was at Supervision level with stair training this morning. Pt reports that he has no concerns about getting into/out of his shower, however would like to perform sponge bath this morning. OT set pt up with supplies and pt states he has no further ADL concerns.   Juluis Rainier [phone] 06/13/2014, 9:39 AM

## 2014-06-13 NOTE — Progress Notes (Signed)
Physical Therapy Treatment Patient Details Name: MALIC ROSTEN MRN: [id number] DOB: [birth year] Today's Date: 06/13/2014    History of Present Illness  68 y.o. male, has a history of pain and  disability in the right knee due to arthritis with failed conservative management.  Onset of symptoms was 5 years ago with gradually worsening course since that time. The patient noted no past surgery on the right knee.  PMHx:  cubital tunnel syndrome, OSA, diverticulosis, HTN, obesity, GERD, osteopenia, B 12 deficiency, back surgery, glaucoma    PT Comments    Pt reports he has reconsidered and wants to go home now, planning to be there if permitted tomorrow.  Had son spending the night with him who was available to instruct for stairs.  Pain complaints were minimal due to recent meds.  Follow Up Recommendations  Home health PT     Equipment Recommendations  Rolling walker with 5" wheels    Recommendations for Other Services       Precautions / Restrictions Precautions Precautions: Knee Precaution Comments: Reminded him about asking for help to get up  Restrictions Weight Bearing Restrictions: Yes RLE Weight Bearing: Weight bearing as tolerated    Mobility  Bed Mobility Overal bed mobility: Needs Assistance Bed Mobility: Supine to Sit     Supine to sit: Supervision     General bed mobility comments: Increased time and verbal cues for scooting out to edge of bed  Transfers Overall transfer level: Needs assistance Equipment used: Rolling walker (2 wheeled) Transfers: Sit to/from [employer] Transfers Sit to Stand: Supervision         General transfer comment: Supervision to observe safety as plan has changed ffor pt to go home  Ambulation/Gait Ambulation/Gait assistance: Supervision Ambulation Distance (Feet): 150 Feet Assistive device: Rolling walker (2 wheeled) Gait Pattern/deviations: Step-through pattern;Wide base of support;Trunk flexed Gait velocity:  approaching normal Gait velocity interpretation: Below normal speed for age/gender General Gait Details: reminders for safety and minor directional cues, turns are fast for his state of healing   Stairs            Wheelchair Mobility    Modified Rankin (Stroke Patients Only)       Balance Overall balance assessment: Modified Independent Sitting-balance support: Feet supported Sitting balance-Leahy Scale: Good     Standing balance support: Bilateral upper extremity supported Standing balance-Leahy Scale: Fair                      Cognition Arousal/Alertness: Awake/alert Behavior During Therapy: WFL for tasks assessed/performed Overall Cognitive Status: Within Functional Limits for tasks assessed                      Exercises      General Comments General comments (skin integrity, edema, etc.): Reviewed knee precautions      Pertinent Vitals/Pain Pain was 4/10 both at rest and with gait.   BP was 142/66 and pulse 83, O2 sat 94%.       Home Living                      Prior Function            PT Goals (current goals can now be found in the care plan section) Acute Rehab PT Goals Patient Stated Goal: To walk farther and to get to BR Progress towards PT goals: Progressing toward goals    Frequency  7X/week    PT  Plan Current plan remains appropriate    Co-evaluation             End of Session   Activity Tolerance: Patient limited by pain Patient left: in chair;with call bell/phone within reach;with family/visitor present     Time: 6579-0383 PT Time Calculation (min): 32 min  Charges:  $Gait Training: 8-22 mins $Therapeutic Exercise: 8-22 mins                    G Codes:      Ramond Dial 07/11/2014, 9:35 AM  Mee Hives, PT MS Acute Rehab Dept. Number: [phone]

## 2014-06-13 NOTE — Progress Notes (Signed)
Checked with pt about CPAP; pt states he can place himself on later when ready, just needed humidifier re-filled. Humidifier filled. RN in room and aware pt will place himself on. Told to notify if any further assistance is needed,

## 2014-06-13 NOTE — Progress Notes (Signed)
Pt will return home with Home health. No SW services needed.   Cameron [name] [phone]

## 2014-06-13 NOTE — Progress Notes (Signed)
Physical Therapy Treatment Patient Details Name: Cameron [name] MRN: [id number] DOB: 10-17-1946 Today's Date: 06/13/2014    History of Present Illness  68 y.o. male, has a history of pain and  disability in the right knee due to arthritis with failed conservative management.  Onset of symptoms was 5 years ago with gradually worsening course since that time. The patient noted no past surgery on the right knee.  PMHx:  cubital tunnel syndrome, OSA, diverticulosis, HTN, obesity, GERD, osteopenia, B 12 deficiency, back surgery, glaucoma    PT Comments    Session focused on educating wife and pt on proper stair management technique. Pt also able to increase mobility and progress therapeutic exercises. Hopeful for D/C home with HHPT tomorrow.   Follow Up Recommendations  Home health PT     Equipment Recommendations  Rolling walker with 5" wheels    Recommendations for Other Services       Precautions / Restrictions Precautions Precautions: Knee Precaution Comments: reinforced no pillow under knee Restrictions Weight Bearing Restrictions: Yes RLE Weight Bearing: Weight bearing as tolerated    Mobility  Bed Mobility               General bed mobility comments: not assessed; pt up in bathroom and RN upon arrival  Transfers Overall transfer level: Needs assistance Equipment used: Rolling walker (2 wheeled) Transfers: Sit to/from Stand Sit to Stand: Supervision         General transfer comment: supervision for cues for hand placement and control descent to chair  Ambulation/Gait Ambulation/Gait assistance: Supervision Ambulation Distance (Feet): 400 Feet Assistive device: Rolling walker (2 wheeled) Gait Pattern/deviations: Step-through pattern;Wide base of support Gait velocity: WFL Gait velocity interpretation: at or above normal speed for age/gender General Gait Details: min cues for safety with RW   Stairs Stairs: Yes Stairs assistance: Min guard Stair  Management: No rails;Step to pattern;Backwards;With walker Number of Stairs: 4 (2 x 2) General stair comments: wife present for family education; cues for sequencing and min guard to steady RW; pt and wife given handout for carryover  Wheelchair Mobility    Modified Rankin (Stroke Patients Only)       Balance Overall balance assessment: No apparent balance deficits (not formally assessed)                                  Cognition Arousal/Alertness: Awake/alert Behavior During Therapy: WFL for tasks assessed/performed Overall Cognitive Status: Within Functional Limits for tasks assessed                      Exercises Total Joint Exercises Quad Sets: AROM;Strengthening;Right;10 reps;Seated Heel Slides: AAROM;Right;10 reps;Seated Hip ABduction/ADduction: AAROM;Right;10 reps;Standing Long Arc Quad: AROM;Strengthening;Right;10 reps;Seated Goniometric ROM: AAROM in sitting flexion at 80 degrees; -2 extension - lmited by pain    General Comments        Pertinent Vitals/Pain Pain Assessment: 0-10 Pain Score: 6  Pain Location: at incision Pain Descriptors / Indicators: Aching Pain Intervention(s): Monitored during session;Repositioned    Home Living                      Prior Function            PT Goals (current goals can now be found in the care plan section) Acute Rehab PT Goals Patient Stated Goal: to practice steps with my wife here PT Goal Formulation: With patient/family Time  For Goal Achievement: 06/19/14 Potential to Achieve Goals: Good Progress towards PT goals: Progressing toward goals    Frequency  7X/week    PT Plan Current plan remains appropriate    Co-evaluation             End of Session Equipment Utilized During Treatment: Gait belt Activity Tolerance: Patient tolerated treatment well Patient left: in chair;with call bell/phone within reach;with family/visitor present     Time: 1535-1600 PT Time  Calculation (min): 25 min  Charges:  $Gait Training: 8-22 mins $Therapeutic Exercise: 8-22 mins                    G CodesGustavus [name], [place] 06/13/2014, 4:48 PM

## 2014-06-13 NOTE — Progress Notes (Signed)
Subjective: 2 Days Post-Op Procedure(s) (LRB): TOTAL KNEE ARTHROPLASTY (Right)  Activity level:  wbat Diet tolerance:  Eating well Voiding:   ok Patient reports pain as mild.    Objective: Vital signs in last 24 hours: Temp:  [98.9 F (37.2 C)-99.4 F (37.4 C)] 98.9 F (37.2 C) (08/06 0506) Pulse Rate:  [78-112] 83 (08/06 0506) Resp:  [16-18] 18 (08/06 0506) BP: (124-142)/(66-78) 142/66 mmHg (08/06 0506) SpO2:  [85 %-97 %] 94 % (08/06 0506)  Labs:  Recent Labs  06/12/14 0620 06/13/14 0623  HGB 11.7* 11.0*    Recent Labs  06/12/14 0620 06/13/14 0623  WBC 6.7 8.8  RBC 4.03* 3.78*  HCT 36.4* 33.4*  PLT 200 173    Recent Labs  06/12/14 0620  NA 141  K 4.1  CL 103  CO2 28  BUN 10  CREATININE 0.87  GLUCOSE 119*  CALCIUM 8.3*   No results found for this basename: LABPT, INR,  in the last 72 hours  Physical Exam:  Neurologically intact ABD soft Neurovascular intact Sensation intact distally Intact pulses distally Dorsiflexion/Plantar flexion intact Incision: scant drainage No cellulitis present Compartment soft  Assessment/Plan:  2 Days Post-Op Procedure(s) (LRB): TOTAL KNEE ARTHROPLASTY (Right) Advance diet Up with therapy Plan for discharge tomorrow Discharge home with home health Continue on ASA 325mg  BID x 2 weeks.  Follow up in office 2 weeks post op.       Derionna Salvador, Larwance Sachs 06/13/2014, 12:16 PM

## 2014-06-14 ENCOUNTER — Other Ambulatory Visit: Payer: Self-pay | Admitting: Internal Medicine

## 2014-06-14 LAB — CBC
HCT: 31.9 % — ABNORMAL LOW (ref 39.0–52.0)
Hemoglobin: 10.3 g/dL — ABNORMAL LOW (ref 13.0–17.0)
MCH: 28.3 pg (ref 26.0–34.0)
MCHC: 32.3 g/dL (ref 30.0–36.0)
MCV: 87.6 fL (ref 78.0–100.0)
Platelets: 167 10*3/uL (ref 150–400)
RBC: 3.64 MIL/uL — ABNORMAL LOW (ref 4.22–5.81)
RDW: 13.6 % (ref 11.5–15.5)
WBC: 8.9 10*3/uL (ref 4.0–10.5)

## 2014-06-14 MED ORDER — METHOCARBAMOL 500 MG PO TABS: 500.0000 mg | ORAL_TABLET | Freq: Four times a day (QID) | ORAL | Status: DC | PRN

## 2014-06-14 MED ORDER — HYDROCODONE-ACETAMINOPHEN 5-325 MG PO TABS: 1.0000 | ORAL_TABLET | ORAL | Status: DC | PRN

## 2014-06-14 MED ORDER — ASPIRIN 325 MG PO TBEC: 325.0000 mg | DELAYED_RELEASE_TABLET | Freq: Two times a day (BID) | ORAL | Status: DC

## 2014-06-14 NOTE — Care Management Note (Signed)
:    06/14/14 9:51AM Ricki Miller, RN BSN Case Manager Patient states that he will need a hospital bed for home. Case manager explained that he may recieve a charge for the bed from the [facility]. Mr. Kann states that will be alright. Case manager placed call to [employer], [facility] liaison with request for hospital bed.

## 2014-06-14 NOTE — Discharge Summary (Signed)
Patient ID: Cameron [name] MRN: [id number] DOB/AGE: 66/05/1946 66 y.o.  Admit date: 06/11/2014 Discharge date: 06/14/2014  Admission Diagnoses:  Principal Problem:   Right knee DJD Active Problems:   Morbid obesity   Discharge Diagnoses:  Same  Past Medical History  Diagnosis Date  . HTN (hypertension)   . Diverticulosis of colon   . Osteopenia     DEXA 12-2006    . Sleep apnea     on CPAP  . Personal history of colonic polyps 2002    TUBULAR ADENOMA  . Family history of malignant neoplasm of gastrointestinal tract   . Iron deficiency anemia     h/o  . Fatty liver   . Prostatitis, acute 04-2011    admitted to [facility]  . GERD (gastroesophageal reflux disease)     takes Prevacid  . Arthritis     degenerative joint disease  . Glaucoma (increased eye pressure)   . Keloid of skin     Surgeries: Procedure(s): TOTAL KNEE ARTHROPLASTY on 06/11/2014   Consultants:    Discharged Condition: Improved  Hospital Course: DURWARD MATRANGA is an 68 y.o. male who was admitted 06/11/2014 for operative treatment ofRight knee DJD. Patient has severe unremitting pain that affects sleep, daily activities, and work/hobbies. After pre-op clearance the patient was taken to the operating room on 06/11/2014 and underwent  Procedure(s): TOTAL KNEE ARTHROPLASTY.    Patient was given perioperative antibiotics: Anti-infectives   Start     Dose/Rate Route Frequency Ordered Stop   06/11/14 2000  ceFAZolin (ANCEF) 3 g in dextrose 5 % 50 mL IVPB     3 g 160 mL/hr over 30 Minutes Intravenous Every 6 hours 06/11/14 1900 06/12/14 0250   06/11/14 0600  ceFAZolin (ANCEF) 3 g in dextrose 5 % 50 mL IVPB     3 g 160 mL/hr over 30 Minutes Intravenous On call to O.R. 06/10/14 1412 06/11/14 1353       Patient was given sequential compression devices, early ambulation, and chemoprophylaxis to prevent DVT.  Patient benefited maximally from hospital stay and there were no complications.    Recent vital signs:  Patient Vitals for the past 24 hrs:  BP Temp Temp src Pulse Resp SpO2  06/14/14 0620 110/74 mmHg 98.3 F (36.8 C) Oral 93 18 93 %  06/14/14 0400 - - - - 18 -  06/14/14 0000 - - - - 18 -  06/13/14 2047 123/69 mmHg 99 F (37.2 C) Oral 105 18 92 %  06/13/14 2000 - - - - 16 -  06/13/14 1606 127/75 mmHg 98.6 F (37 C) Oral 108 18 94 %     Recent laboratory studies:  Recent Labs  06/12/14 0620 06/13/14 0623 06/14/14 0525  WBC 6.7 8.8 8.9  HGB 11.7* 11.0* 10.3*  HCT 36.4* 33.4* 31.9*  PLT 200 173 167  NA 141  --   --   K 4.1  --   --   CL 103  --   --   CO2 28  --   --   BUN 10  --   --   CREATININE 0.87  --   --   GLUCOSE 119*  --   --   CALCIUM 8.3*  --   --      Discharge Medications:     Medication List    STOP taking these medications       aspirin 81 MG tablet  Replaced by:  aspirin 325 MG EC tablet  TAKE these medications       aspirin 325 MG EC tablet  Take 1 tablet (325 mg total) by mouth 2 (two) times daily after a meal.     darifenacin 7.5 MG 24 hr tablet  Commonly known as:  ENABLEX  Take 7.5 mg by mouth every morning.     folic acid 1 MG tablet  Commonly known as:  FOLVITE  Take 1 mg by mouth at bedtime.     HYDROcodone-acetaminophen 5-325 MG per tablet  Commonly known as:  NORCO/VICODIN  Take 1-2 tablets by mouth every 4 (four) hours as needed for moderate pain or severe pain (breakthrough pain).     lansoprazole 30 MG capsule  Commonly known as:  PREVACID  Take 30 mg by mouth every morning.     latanoprost 0.005 % ophthalmic solution  Commonly known as:  XALATAN  Place 1 drop into both eyes at bedtime.     methocarbamol 500 MG tablet  Commonly known as:  ROBAXIN  Take 1 tablet (500 mg total) by mouth every 6 (six) hours as needed for muscle spasms.     metoprolol succinate 25 MG 24 hr tablet  Commonly known as:  TOPROL-XL  Take 25 mg by mouth at bedtime.     MYRBETRIQ 50 MG Tb24 tablet  Generic drug:  mirabegron ER  Take 50 mg  by mouth daily.     solifenacin 5 MG tablet  Commonly known as:  VESICARE  Take 5 mg by mouth daily.     timolol 0.5 % ophthalmic solution  Commonly known as:  BETIMOL  Place 1 drop into both eyes 2 (two) times daily.     vitamin C 1000 MG tablet  Take 2,000 mg by mouth daily.        Diagnostic Studies: Dg Chest 2 View  06/04/2014   CLINICAL DATA:  Preoperative radiograph for right total knee arthroplasty. History of sleep apnea, hypertension, and GERD.  EXAM: CHEST  2 VIEW  COMPARISON:  11/29/2011, 01/19/2009  FINDINGS: Mild elevation of the right hemidiaphragm. Mild left lung base opacity. Mild aortic tortuosity. Heart size normal range. No overt pleural effusion. No pneumothorax. Surgical clips right upper quadrant. Mild multilevel degenerative changes.  IMPRESSION: Mild left lung base opacity; atelectasis/scarring (favored) versus infiltrate.  Mild elevation of the right hemidiaphragm.   Electronically Signed   By: Carlos Levering M.D.   On: 06/04/2014 09:05   US Venous Img Lower Unilateral Left  05/17/2014   CLINICAL DATA:  Two week history of left ankle swelling  EXAM: LEFT LOWER EXTREMITY VENOUS DOPPLER ULTRASOUND  TECHNIQUE: Gray-scale sonography with graded compression, as well as color Doppler and duplex ultrasound were performed to evaluate the lower extremity deep venous systems from the level of the common femoral vein and including the common femoral, femoral, profunda femoral, popliteal and calf veins including the posterior tibial, peroneal and gastrocnemius veins when visible. The superficial great saphenous vein was also interrogated. Spectral Doppler was utilized to evaluate flow at rest and with distal augmentation maneuvers in the common femoral, femoral and popliteal veins.  COMPARISON:  None.  FINDINGS: Common Femoral Vein: No evidence of thrombus. Normal compressibility, respiratory phasicity and response to augmentation.  Saphenofemoral Junction: No evidence of thrombus.  Normal compressibility and flow on color Doppler imaging.  Profunda Femoral Vein: No evidence of thrombus. Normal compressibility and flow on color Doppler imaging.  Femoral Vein: No evidence of thrombus. Normal compressibility, respiratory phasicity and response to augmentation.  Popliteal  Vein: No evidence of thrombus. Normal compressibility, respiratory phasicity and response to augmentation.  Calf Veins: No evidence of thrombus. Normal compressibility and flow on color Doppler imaging.  Superficial Great Saphenous Vein: No evidence of thrombus. Normal compressibility and flow on color Doppler imaging.  Venous Reflux:  None.  Other Findings:  None.  IMPRESSION: No evidence of deep venous thrombosis.   Electronically Signed   By: Jacqulynn Cadet M.D.   On: 05/17/2014 17:12    Disposition:       Discharge Instructions   Call MD / Call 911    Complete by:  As directed   If you experience chest pain or shortness of breath, CALL 911 and be transported to the hospital emergency room.  If you develope a fever above 101 F, pus (white drainage) or increased drainage or redness at the wound, or calf pain, call your surgeon's office.     Constipation Prevention    Complete by:  As directed   Drink plenty of fluids.  Prune juice may be helpful.  You may use a stool softener, such as Colace (over the counter) 100 mg twice a day.  Use MiraLax (over the counter) for constipation as needed.     Diet - low sodium heart healthy    Complete by:  As directed      Increase activity slowly as tolerated    Complete by:  As directed            Follow-up Information   Follow up with Hessie Dibble, MD. Call in 2 weeks.   Specialty:  Orthopedic Surgery   Contact information:   [place] [address] [phone]       Follow up with [facility]. (Someone from [facility] will contact you concerning start date and time for physical therapy.)    Contact information:    [address] [place] [address] [phone]        Signed: Rich Fuchs 06/14/2014, 7:54 AM

## 2014-06-14 NOTE — Progress Notes (Signed)
Physical Therapy Treatment Patient Details Name: IZAK ANDING MRN: [id number] DOB: 01-14-1946 Today's Date: 06/14/2014    History of Present Illness  68 y.o. male, has a history of pain and  disability in the right knee due to arthritis with failed conservative management.  Onset of symptoms was 5 years ago with gradually worsening course since that time. The patient noted no past surgery on the right knee.  PMHx:  cubital tunnel syndrome, OSA, diverticulosis, HTN, obesity, GERD, osteopenia, B 12 deficiency, back surgery, glaucoma    PT Comments    Patient making great progress. No further questions at this time. Planning to DC home later this AM  Follow Up Recommendations  Home health PT     Equipment Recommendations  Rolling walker with 5" wheels    Recommendations for Other Services       Precautions / Restrictions Precautions Precautions: Knee Restrictions RLE Weight Bearing: Weight bearing as tolerated    Mobility  Bed Mobility Overal bed mobility: Modified Independent                Transfers Overall transfer level: Modified independent                  Ambulation/Gait Ambulation/Gait assistance: Modified independent (Device/Increase time)               Stairs            Wheelchair Mobility    Modified Rankin (Stroke Patients Only)       Balance                                    Cognition Arousal/Alertness: Awake/alert Behavior During Therapy: WFL for tasks assessed/performed Overall Cognitive Status: Within Functional Limits for tasks assessed                      Exercises Total Joint Exercises Quad Sets: AROM;Strengthening;Right;10 reps;Seated Heel Slides: AAROM;Right;10 reps;Seated Straight Leg Raises: AROM;Right;10 reps Long Arc Quad: AROM;Strengthening;Right;10 reps;Seated    General Comments        Pertinent Vitals/Pain Pain Assessment: No/denies pain    Home Living                       Prior Function            PT Goals (current goals can now be found in the care plan section) Progress towards PT goals: Progressing toward goals    Frequency  7X/week    PT Plan Current plan remains appropriate    Co-evaluation             End of Session Equipment Utilized During Treatment: Gait belt Activity Tolerance: Patient tolerated treatment well Patient left: in chair;with call bell/phone within reach     Time: 0749-0804 PT Time Calculation (min): 15 min  Charges:  $Gait Training: 8-22 mins                    G Codes:      Jacqualyn Posey 06/14/2014, 8:12 AM  06/14/2014 Jacqualyn Posey PTA [phone] pager [phone] office

## 2014-06-15 ENCOUNTER — Other Ambulatory Visit: Payer: Self-pay | Admitting: Internal Medicine

## 2014-07-23 ENCOUNTER — Ambulatory Visit (INDEPENDENT_AMBULATORY_CARE_PROVIDER_SITE_OTHER): Payer: BC Managed Care – PPO | Admitting: Internal Medicine

## 2014-07-23 ENCOUNTER — Encounter: Payer: Self-pay | Admitting: Internal Medicine

## 2014-07-23 VITALS — BP 137/89 | HR 73 | Temp 98.3°F | Ht 72.0 in | Wt 323.1 lb

## 2014-07-23 DIAGNOSIS — Z0000 Encounter for general adult medical examination without abnormal findings: Secondary | ICD-10-CM

## 2014-07-23 DIAGNOSIS — M8580 Other specified disorders of bone density and structure, unspecified site: Secondary | ICD-10-CM

## 2014-07-23 DIAGNOSIS — E538 Deficiency of other specified B group vitamins: Secondary | ICD-10-CM

## 2014-07-23 DIAGNOSIS — Z23 Encounter for immunization: Secondary | ICD-10-CM

## 2014-07-23 LAB — HEPATIC FUNCTION PANEL
ALT: 28 U/L (ref 0–53)
AST: 29 U/L (ref 0–37)
Albumin: 3.8 g/dL (ref 3.5–5.2)
Alkaline Phosphatase: 98 U/L (ref 39–117)
Bilirubin, Direct: 0 mg/dL (ref 0.0–0.3)
Total Bilirubin: 0.7 mg/dL (ref 0.2–1.2)
Total Protein: 6.9 g/dL (ref 6.0–8.3)

## 2014-07-23 LAB — LIPID PANEL
Cholesterol: 156 mg/dL (ref 0–200)
HDL: 34.2 mg/dL — ABNORMAL LOW (ref >39.00)
LDL Cholesterol: 106 mg/dL — ABNORMAL HIGH (ref 0–99)
NonHDL: 121.8
Total CHOL/HDL Ratio: 5
Triglycerides: 80 mg/dL (ref 0.0–149.0)
VLDL: 16 mg/dL (ref 0.0–40.0)

## 2014-07-23 NOTE — Assessment & Plan Note (Addendum)
Last b12 wnl, rec OTC supplements

## 2014-07-23 NOTE — Assessment & Plan Note (Signed)
Td 07 zostavax 2014 pnm shot -- today Flu shot-- at work Colonoscopy:  05/08/2004, neg--no polyps  Colonoscopy: 12-2009-- tics Cscope again 05-03-11, polyp, 5 years  PSA per urology   Diet   Discussed S/p knee replacement, gradually goin back to more physical activity

## 2014-07-23 NOTE — Progress Notes (Signed)
Subjective:    Patient ID: Cameron [name], male    DOB: [birth year], 68 y.o.   MRN: [id number]  DOS:  07/23/2014 Type of visit - description : CPX Interval history:Had a knee replacement, recovering very well .   ROS No  CP, SOB Denies  nausea, vomiting diarrhea, blood in the stools (-) cough, sputum production (-) wheezing, chest congestion No dysuria, gross hematuria, difficulty urinating  Nocturia decrease w/ meds  No anxiety, depression     Past Medical History  Diagnosis Date  . HTN (hypertension)   . Diverticulosis of colon   . Osteopenia     DEXA 12-2006    . Sleep apnea     on CPAP  . Personal history of colonic polyps 2002    TUBULAR ADENOMA  . Family history of malignant neoplasm of gastrointestinal tract   . Iron deficiency anemia     h/o  . Fatty liver   . Prostatitis, acute 04-2011    admitted to [facility]  . GERD (gastroesophageal reflux disease)     takes Prevacid  . Arthritis     degenerative joint disease  . Glaucoma (increased eye pressure)   . Keloid of skin     Past Surgical History  Procedure Laterality Date  . Cholecystectomy  2003  . Back surgery  2003  . Colonoscopy    . Total knee arthroplasty Right 06/11/2014    Procedure: TOTAL KNEE ARTHROPLASTY;  Surgeon: Hessie Dibble, MD;  Location: [facility];  Service: Orthopedics;  Laterality: Right;    History   Social History  . Marital Status: Married    Spouse Name: N/A    Number of Children: 2  . Years of Education: N/A   Occupational History  . works in [employer]  . Smoking status: Never Smoker   . Smokeless tobacco: Never Used  . Alcohol Use: Yes     Comment: socially  . Drug Use: No  . Sexual Activity: Not on file   Other Topics Concern  . Not on file   Social History Narrative    plays tennis sometimes      Family History  Problem Relation Age of Onset  . Colon cancer Maternal Uncle   . Colon cancer Other     grandfather  . Prostate cancer Neg  Hx   . Heart attack Neg Hx   . Hypertension Neg Hx   . Diabetes Neg Hx   . Stroke Other     maternal aunt in her 63s  . Dementia Mother   . Diverticulitis Father        Medication List       This list is accurate as of: 07/23/14  8:22 AM.  Always use your most recent med list.               aspirin 81 MG tablet  Take 81 mg by mouth daily.     darifenacin 7.5 MG 24 hr tablet  Commonly known as:  ENABLEX  Take 7.5 mg by mouth every morning.     folic acid 1 MG tablet  Commonly known as:  FOLVITE  TAKE 1 TABLET BY MOUTH EVERY DAY     lansoprazole 30 MG capsule  Commonly known as:  PREVACID  Take 30 mg by mouth every morning.     latanoprost 0.005 % ophthalmic solution  Commonly known as:  XALATAN  Place 1 drop into both eyes at bedtime.  metoprolol succinate 25 MG 24 hr tablet  Commonly known as:  TOPROL-XL  TAKE ONE-HALF (1/2) TABLET DAILY     MYRBETRIQ 50 MG Tb24 tablet  Generic drug:  mirabegron ER  Take 50 mg by mouth daily.     timolol 0.5 % ophthalmic solution  Commonly known as:  BETIMOL  Place 1 drop into both eyes 2 (two) times daily.     vitamin C 1000 MG tablet  Take 2,000 mg by mouth daily.           Objective:   Physical Exam BP 137/89  Pulse 73  Temp(Src) 98.3 F (36.8 C) (Oral)  Ht 6' (1.829 m)  Wt 323 lb 2 oz (146.569 kg)  BMI 43.81 kg/m2  SpO2 96% General -- alert, well-developed, NAD.  Neck --no thyromegaly   HEENT-- Not pale.  Lungs -- normal respiratory effort, no intercostal retractions, no accessory muscle use, and normal breath sounds.  Heart-- normal rate, regular rhythm, no murmur.  Abdomen-- Not distended, good bowel sounds,soft, non-tender.  Extremities-- no pretibial edema bilaterally  Neurologic--  alert & oriented X3. Speech normal, gait appropriate for age, strength symmetric and appropriate for age.  Psych-- Cognition and judgment appear intact. Cooperative with normal attention span and concentration. No  anxious or depressed appearing.        Assessment & Plan:

## 2014-07-23 NOTE — Assessment & Plan Note (Signed)
dexa never done, rec vit D daily

## 2014-07-23 NOTE — Patient Instructions (Signed)
Get your blood work before you leave   Take OTC vitamins B and vitamin D ([phone]) daily   Please come back to the office 1 year for a   physical exam, fasting    Stop by the front desk and schedule the visit

## 2014-07-23 NOTE — Progress Notes (Signed)
Pre visit review using our clinic review tool, if applicable. No additional management support is needed unless otherwise documented below in the visit note. 

## 2014-07-24 LAB — CBC WITH DIFFERENTIAL/PLATELET
Basophils Absolute: 0 10*3/uL (ref 0.0–0.1)
Basophils Relative: 0.4 % (ref 0.0–3.0)
Eosinophils Absolute: 0.3 10*3/uL (ref 0.0–0.7)
Eosinophils Relative: 8.2 % — ABNORMAL HIGH (ref 0.0–5.0)
HCT: 37.9 % — ABNORMAL LOW (ref 39.0–52.0)
Hemoglobin: 12.4 g/dL — ABNORMAL LOW (ref 13.0–17.0)
Lymphocytes Relative: 33.9 % (ref 12.0–46.0)
Lymphs Abs: 1.1 10*3/uL (ref 0.7–4.0)
MCHC: 32.8 g/dL (ref 30.0–36.0)
MCV: 89 fl (ref 78.0–100.0)
Monocytes Absolute: 0.6 10*3/uL (ref 0.1–1.0)
Monocytes Relative: 18 % — ABNORMAL HIGH (ref 3.0–12.0)
Neutro Abs: 1.3 10*3/uL — ABNORMAL LOW (ref 1.4–7.7)
Neutrophils Relative %: 39.5 % — ABNORMAL LOW (ref 43.0–77.0)
Platelets: 246 10*3/uL (ref 150.0–400.0)
RBC: 4.26 Mil/uL (ref 4.22–5.81)
RDW: 15 % (ref 11.5–15.5)
WBC: 3.4 10*3/uL — ABNORMAL LOW (ref 4.0–10.5)

## 2014-08-16 DIAGNOSIS — N3281 Overactive bladder: Secondary | ICD-10-CM

## 2014-08-16 DIAGNOSIS — R972 Elevated prostate specific antigen [PSA]: Secondary | ICD-10-CM

## 2014-08-16 HISTORY — DX: Overactive bladder: N32.81

## 2014-08-16 HISTORY — DX: Elevated prostate specific antigen (PSA): R97.20

## 2014-09-09 ENCOUNTER — Other Ambulatory Visit: Payer: Self-pay | Admitting: Orthopaedic Surgery

## 2014-09-26 ENCOUNTER — Ambulatory Visit (INDEPENDENT_AMBULATORY_CARE_PROVIDER_SITE_OTHER): Payer: BC Managed Care – PPO | Admitting: Internal Medicine

## 2014-09-26 ENCOUNTER — Encounter: Payer: Self-pay | Admitting: Internal Medicine

## 2014-09-26 VITALS — BP 132/78 | HR 90 | Temp 97.8°F | Wt 306.5 lb

## 2014-09-26 DIAGNOSIS — J209 Acute bronchitis, unspecified: Secondary | ICD-10-CM

## 2014-09-26 MED ORDER — AZITHROMYCIN 250 MG PO TABS: ORAL_TABLET | ORAL | Status: DC

## 2014-09-26 MED ORDER — HYDROCODONE-HOMATROPINE 5-1.5 MG/5ML PO SYRP: 5.0000 mL | ORAL_SOLUTION | Freq: Every evening | ORAL | Status: DC | PRN

## 2014-09-26 NOTE — Progress Notes (Signed)
Subjective:    Patient ID: Cameron [name], male    DOB: [birth year], 68 y.o.   MRN: [id number]  DOS:  09/26/2014 Type of visit - description : acute Interval history: Symptoms started approximately 4-6 weeks ago with cough, worse late at night and early in the morning, coughing up sputum, sometimes abundant, color?Marland Kitchen Taking Mucinex,aleve etc w/  mild help.   ROS No sore throat or postnasal dripping. No sinus pain or congestion GERD symptoms controlled No shortness of breath, occasional wheezing? No allergies although he was exposed to dust  doing yard work 2 weeks ago and symptoms got worse.  Past Medical History  Diagnosis Date  . HTN (hypertension)   . Diverticulosis of colon   . Osteopenia     DEXA 12-2006    . Sleep apnea     on CPAP  . Personal history of colonic polyps 2002    TUBULAR ADENOMA  . Iron deficiency anemia     h/o  . Fatty liver   . Prostatitis, acute 04-2011    admitted to [facility]  . GERD (gastroesophageal reflux disease)     takes Prevacid  . Arthritis     degenerative joint disease  . Glaucoma (increased eye pressure)   . Keloid of skin     Past Surgical History  Procedure Laterality Date  . Cholecystectomy  2003  . Back surgery  2003  . Colonoscopy    . Total knee arthroplasty Right 06/11/2014    Procedure: TOTAL KNEE ARTHROPLASTY;  Surgeon: Hessie Dibble, MD;  Location: [facility];  Service: Orthopedics;  Laterality: Right;    History   Social History  . Marital Status: Married    Spouse Name: N/A    Number of Children: 2  . Years of Education: N/A   Occupational History  . works in [place]  . Smoking status: Never Smoker   . Smokeless tobacco: Never Used  . Alcohol Use: Yes     Comment: socially  . Drug Use: No  . Sexual Activity: Not on file   Other Topics Concern  . Not on file   Social History Narrative    plays tennis sometimes         Medication List       This list is accurate as of:  09/26/14  7:25 PM.  Always use your most recent med list.               aspirin 81 MG tablet  Take 81 mg by mouth daily.     azithromycin 250 MG tablet  Commonly known as:  ZITHROMAX Z-PAK  2 tabs a day the first day, then 1 tab a day x 4 days     folic acid 1 MG tablet  Commonly known as:  FOLVITE  TAKE 1 TABLET BY MOUTH EVERY DAY     HYDROcodone-homatropine 5-1.5 MG/5ML syrup  Commonly known as:  HYCODAN  Take 5 mLs by mouth at bedtime as needed for cough.     lansoprazole 30 MG capsule  Commonly known as:  PREVACID  Take 30 mg by mouth every morning.     latanoprost 0.005 % ophthalmic solution  Commonly known as:  XALATAN  Place 1 drop into both eyes at bedtime.     metoprolol succinate 25 MG 24 hr tablet  Commonly known as:  TOPROL-XL  TAKE ONE-HALF (1/2) TABLET DAILY     MYRBETRIQ 50 MG Tb24 tablet  Generic drug:  mirabegron ER  Take 50 mg by mouth daily.     timolol 0.5 % ophthalmic solution  Commonly known as:  BETIMOL  Place 1 drop into both eyes daily.     vitamin C 1000 MG tablet  Take 2,000 mg by mouth daily.           Objective:   Physical Exam BP 132/78 mmHg  Pulse 90  Temp(Src) 97.8 F (36.6 C) (Oral)  Wt 306 lb 8 oz (139.027 kg)  SpO2 98% General -- alert, well-developed, NAD.   HEENT-- Not pale.  R Ear-- normal L ear-- normal Throat symmetric, no redness or discharge. Face symmetric, sinuses not tender to palpation. Nose not congested. Lungs -- normal respiratory effort, no intercostal retractions, no accessory muscle use, and normal breath sounds except for scattered rhonchi with cough only.  Heart-- normal rate, regular rhythm, no murmur.  Neurologic--  alert & oriented X3.   Psych-- Cognition and judgment appear intact. Cooperative with normal attention span and concentration. No anxious or depressed appearing.       Assessment & Plan:   Atypical bronchitis? Cough for 4-6 weeks, exam is benign, atypical bronchitis?. Plan: Z-Pak,  cough control with Mucinex and hydrocodone, call if no better

## 2014-09-26 NOTE — Patient Instructions (Addendum)
Rest, fluids , tylenol If  cough, take Mucinex DM twice a day as needed  If the cough is severe at night, taking the hydrocodone syrup, will cause drowsiness, be careful  Take the antibiotic as prescribed  (zithromax) Call if not gradually better over the next  10 days Call anytime if the symptoms are severe, you have high fever, short of breath, chest pain

## 2014-09-26 NOTE — Progress Notes (Signed)
Pre visit review using our clinic review tool, if applicable. No additional management support is needed unless otherwise documented below in the visit note. 

## 2014-09-30 ENCOUNTER — Encounter (HOSPITAL_COMMUNITY)
Admission: RE | Admit: 2014-09-30 | Discharge: 2014-09-30 | Disposition: A | Discharge status: Home / Self Care | Payer: BLUE CROSS/BLUE SHIELD | Source: Ambulatory Visit | Attending: Orthopaedic Surgery | Admitting: Orthopaedic Surgery

## 2014-09-30 ENCOUNTER — Encounter (HOSPITAL_COMMUNITY): Payer: Self-pay

## 2014-09-30 DIAGNOSIS — M1710 Unilateral primary osteoarthritis, unspecified knee: Secondary | ICD-10-CM | POA: Insufficient documentation

## 2014-09-30 DIAGNOSIS — Z01812 Encounter for preprocedural laboratory examination: Secondary | ICD-10-CM | POA: Diagnosis present

## 2014-09-30 LAB — BASIC METABOLIC PANEL
Anion gap: 14 (ref 5–15)
BUN: 8 mg/dL (ref 6–23)
CO2: 27 mEq/L (ref 19–32)
Calcium: 9.5 mg/dL (ref 8.4–10.5)
Chloride: 96 mEq/L (ref 96–112)
Creatinine, Ser: 0.78 mg/dL (ref 0.50–1.35)
GFR calc Af Amer: >90 mL/min (ref >90)
GFR calc non Af Amer: >90 mL/min (ref >90)
Glucose, Bld: 95 mg/dL (ref 70–99)
Potassium: 3.8 mEq/L (ref 3.7–5.3)
Sodium: 137 mEq/L (ref 137–147)

## 2014-09-30 LAB — CBC WITH DIFFERENTIAL/PLATELET
Basophils Absolute: 0 10*3/uL (ref 0.0–0.1)
Basophils Relative: 0 % (ref 0–1)
Eosinophils Absolute: 0.2 10*3/uL (ref 0.0–0.7)
Eosinophils Relative: 3 % (ref 0–5)
HCT: 38.7 % — ABNORMAL LOW (ref 39.0–52.0)
Hemoglobin: 12.3 g/dL — ABNORMAL LOW (ref 13.0–17.0)
Lymphocytes Relative: 23 % (ref 12–46)
Lymphs Abs: 1.2 10*3/uL (ref 0.7–4.0)
MCH: 26.7 pg (ref 26.0–34.0)
MCHC: 31.8 g/dL (ref 30.0–36.0)
MCV: 83.9 fL (ref 78.0–100.0)
Monocytes Absolute: 0.8 10*3/uL (ref 0.1–1.0)
Monocytes Relative: 16 % — ABNORMAL HIGH (ref 3–12)
Neutro Abs: 2.8 10*3/uL (ref 1.7–7.7)
Neutrophils Relative %: 58 % (ref 43–77)
Platelets: 328 10*3/uL (ref 150–400)
RBC: 4.61 MIL/uL (ref 4.22–5.81)
RDW: 13.7 % (ref 11.5–15.5)
WBC: 5 10*3/uL (ref 4.0–10.5)

## 2014-09-30 LAB — TYPE AND SCREEN
ABO/RH(D): O POS
Antibody Screen: NEGATIVE

## 2014-09-30 LAB — SURGICAL PCR SCREEN
MRSA, PCR: NEGATIVE
Staphylococcus aureus: NEGATIVE

## 2014-09-30 LAB — APTT: aPTT: 29 seconds (ref 24–37)

## 2014-09-30 LAB — PROTIME-INR
INR: 1.16 (ref 0.00–1.49)
Prothrombin Time: 14.9 seconds (ref 11.6–15.2)

## 2014-09-30 NOTE — Progress Notes (Signed)
Pharmacy tech into see pt. During PAT visit

## 2014-09-30 NOTE — Pre-Procedure Instructions (Signed)
DESMAN POLAK  09/30/2014   Your procedure is scheduled on:  10/08/2014  Report to [facility] Admitting  ENTRANCE A at   5:30 AM.  Call this number if you have problems the morning of surgery: [phone]   Remember:   Do not eat food or drink liquids after midnight.  On Monday NIGHT   Take these medicines the morning of surgery with A SIP OF WATER: Myrbetriq, Prevacid, use morning eye drop   Do not wear jewelry   Do not wear lotions, powders, or perfumes. You may wear deodorant.    Men may shave face and neck.   Do not bring valuables to the hospital.  [facility] is not responsible                  for any belongings or valuables.               Contacts, dentures or bridgework may not be worn into surgery.   Leave suitcase in the car. After surgery it may be brought to your room.   For patients admitted to the hospital, discharge time is determined by your                treatment team.               Patients discharged the day of surgery will not be allowed to drive  home.  Name and phone number of your driver: with spouse  Special Instructions: Special Instructions: [facility]lim - Preparing for Surgery  Before surgery, you can play an important role.  Because skin is not sterile, your skin needs to be as free of germs as possible.  You can reduce the number of germs on you skin by washing with CHG (chlorahexidine gluconate) soap before surgery.  CHG is an antiseptic cleaner which kills germs and bonds with the skin to continue killing germs even after washing.  Please DO NOT use if you have an allergy to CHG or antibacterial soaps.  If your skin becomes reddened/irritated stop using the CHG and inform your nurse when you arrive at Short Stay.  Do not shave (including legs and underarms) for at least 48 hours prior to the first CHG shower.  You may shave your face.  Please follow these instructions carefully:   1.  Shower with CHG Soap the night before  surgery and the  morning of Surgery.  2.  If you choose to wash your hair, wash your hair first as usual with your  normal shampoo.  3.  After you shampoo, rinse your hair and body thoroughly to remove the  Shampoo.  4.  Use CHG as you would any other liquid soap.  You can apply chg directly to the skin and wash gently with scrungie or a clean washcloth.  5.  Apply the CHG Soap to your body ONLY FROM THE NECK DOWN.    Do not use on open wounds or open sores.  Avoid contact with your eyes, ears, mouth and genitals (private parts).  Wash genitals (private parts)   with your normal soap.  6.  Wash thoroughly, paying special attention to the area where your surgery will be performed.  7.  Thoroughly rinse your body with warm water from the neck down.  8.  DO NOT shower/wash with your normal soap after using and rinsing off   the CHG Soap.  9.  Pat yourself dry with a clean towel.  10.  Wear clean pajamas.            11.  Place clean sheets on your bed the night of your first shower and do not sleep with pets.  Day of Surgery  Do not apply any lotions/deodorants the morning of surgery.  Please wear clean clothes to the hospital/surgery center.   Please read over the following fact sheets that you were given: Pain Booklet, Coughing and Deep Breathing, Blood Transfusion Information, MRSA Information and Surgical Site Infection Prevention

## 2014-09-30 NOTE — Progress Notes (Signed)
Pt. Reports that he saw DrLarose Kells for cold last week & was given z-pak, finishing now. Pt. Denies flu S&S, no cough, no complaints in chest.

## 2014-09-30 NOTE — Progress Notes (Signed)
Pt. Reports sensitivity to CHG, pt. Instructed to use Dial soap x2

## 2014-10-02 LAB — URINE CULTURE: Colony Count: 3000

## 2014-10-07 MED ORDER — DEXTROSE 5 % IV SOLN
3.0000 g | INTRAVENOUS | Status: AC
  Administered 2014-10-08: 3 g via INTRAVENOUS
  Filled 2014-10-07: qty 3000

## 2014-10-07 MED ORDER — LACTATED RINGERS IV SOLN: INTRAVENOUS | Status: DC

## 2014-10-07 NOTE — H&P (Signed)
TOTAL KNEE ADMISSION H&P  Patient is being admitted for left total knee arthroplasty.  Subjective:  Chief Complaint:left knee pain.  HPI: Cameron [name], 68 y.o. male, has a history of pain and functional disability in the left knee due to arthritis and has failed non-surgical conservative treatments for greater than 12 weeks to includeNSAID's and/or analgesics and corticosteriod injections.  Onset of symptoms was gradual, starting 7 years ago with gradually worsening course since that time. The patient noted no past surgery on the left knee(s).  Patient currently rates pain in the left knee(s) at 10 out of 10 with activity. Patient has night pain, worsening of pain with activity and weight bearing, pain that interferes with activities of daily living, pain with passive range of motion, crepitus and joint swelling.  Patient has evidence of subchondral sclerosis, periarticular osteophytes, joint subluxation and joint space narrowing by imaging studies. This patient has had no. There is no active infection.  Patient Active Problem List   Diagnosis Date Noted  . Right knee DJD 06/11/2014  . Morbid obesity 06/11/2014  . Cubital tunnel syndrome 05/19/2014  . Annual physical exam 03/30/2012  . B12 deficiency 03/30/2012  . Osteopenia, low vit D 03/30/2012  . Fatty liver 04/16/2011  . Esophageal reflux 04/16/2011  . Obesity 04/16/2011  . OBSTRUCTIVE SLEEP APNEA 03/13/2009  . HYPERTENSION 08/02/2007  . DIVERTICULOSIS, COLON 08/02/2007   Past Medical History  Diagnosis Date  . HTN (hypertension)   . Diverticulosis of colon   . Osteopenia     DEXA 12-2006    . Personal history of colonic polyps 2002    TUBULAR ADENOMA  . Iron deficiency anemia     h/o  . Fatty liver   . Prostatitis, acute 04-2011    admitted to [facility]  . GERD (gastroesophageal reflux disease)     takes Prevacid  . Glaucoma (increased eye pressure)   . Keloid of skin   . Sleep apnea     on CPAP, last study [facility]  .  Arthritis     degenerative joint disease, knees     Past Surgical History  Procedure Laterality Date  . Cholecystectomy  2003  . Back surgery  2003  . Colonoscopy    . Total knee arthroplasty Right 06/11/2014    Procedure: TOTAL KNEE ARTHROPLASTY;  Surgeon: Hessie Dibble, MD;  Location: [facility];  Service: Orthopedics;  Laterality: Right;    No prescriptions prior to admission   Allergies  Allergen Reactions  . Chlorhexidine Gluconate Other (See Comments)    Red rash developed on upper chest  . Ciprofloxacin Hcl Itching  . Clindamycin     REACTION: rash    History  Substance Use Topics  . Smoking status: Never Smoker   . Smokeless tobacco: Never Used  . Alcohol Use: Yes     Comment: socially    Family History  Problem Relation Age of Onset  . Colon cancer Maternal Uncle   . Colon cancer Other     grandfather  . Prostate cancer Neg Hx   . Heart attack Neg Hx   . Hypertension Neg Hx   . Diabetes Neg Hx   . Stroke Other     maternal aunt in her 31s  . Dementia Mother   . Diverticulitis Father      Review of Systems  Constitutional: Negative.   HENT: Negative.   Eyes: Negative.   Respiratory: Negative.   Cardiovascular: Negative.   Gastrointestinal: Negative.   Genitourinary:  Negative.   Musculoskeletal: Positive for joint pain.  Skin: Negative.   Neurological: Negative.   Endo/Heme/Allergies: Negative.   Psychiatric/Behavioral: Negative.     Objective:  Physical Exam  Constitutional: He appears well-nourished.  HENT:  Head: Normocephalic.  Eyes: Pupils are equal, round, and reactive to light.  Cardiovascular: Normal rate.   GI: Soft.  Musculoskeletal:  Left knee exam range of motion 0-1 15.  Neurovascular is intact severe pain medial joint line, 1+ crepitation.  Tracy effusion.  Negative Homans  Neurological: He is alert.  Psychiatric: He has a normal mood and affect.    Vital signs in last 24 hours:    Labs:   Estimated body mass index is  43.81 kg/(m^2) as calculated from the following:   Height as of 07/23/14: 6' (1.829 m).   Weight as of 07/23/14: 146.569 kg (323 lb 2 oz).   Imaging Review Plain radiographs demonstrate severe degenerative joint disease of the left knee(s). The overall alignment isneutral. The bone quality appears to be good for age and reported activity level.  Assessment/Plan:  End stage arthritis, left knee Primary severe bone-on-bone osteoarthritis, left knee  The patient history, physical examination, clinical judgment of the provider and imaging studies are consistent with end stage degenerative joint disease of the left knee(s) and total knee arthroplasty is deemed medically necessary. The treatment options including medical management, injection therapy arthroscopy and arthroplasty were discussed at length. The risks and benefits of total knee arthroplasty were presented and reviewed. The risks due to aseptic loosening, infection, stiffness, patella tracking problems, thromboembolic complications and other imponderables were discussed. The patient acknowledged the explanation, agreed to proceed with the plan and consent was signed. Patient is being admitted for inpatient treatment for surgery, pain control, PT, OT, prophylactic antibiotics, VTE prophylaxis, progressive ambulation and ADL's and discharge planning. The patient is planning to be discharged home with home health services

## 2014-10-08 ENCOUNTER — Encounter (HOSPITAL_COMMUNITY)
Admission: RE | Disposition: A | Discharge status: Home Health | Payer: Self-pay | Source: Ambulatory Visit | Attending: Orthopaedic Surgery

## 2014-10-08 ENCOUNTER — Inpatient Hospital Stay (HOSPITAL_COMMUNITY): Payer: BLUE CROSS/BLUE SHIELD | Admitting: Anesthesiology

## 2014-10-08 ENCOUNTER — Inpatient Hospital Stay (HOSPITAL_COMMUNITY)
Admission: RE | Admit: 2014-10-08 | Discharge: 2014-10-11 | DRG: 470 | Disposition: A | Discharge status: Home Health | Payer: BLUE CROSS/BLUE SHIELD | Source: Ambulatory Visit | Attending: Orthopaedic Surgery | Admitting: Orthopaedic Surgery

## 2014-10-08 ENCOUNTER — Encounter (HOSPITAL_COMMUNITY): Payer: Self-pay | Admitting: *Deleted

## 2014-10-08 DIAGNOSIS — I10 Essential (primary) hypertension: Secondary | ICD-10-CM | POA: Diagnosis present

## 2014-10-08 DIAGNOSIS — Z96651 Presence of right artificial knee joint: Secondary | ICD-10-CM | POA: Diagnosis present

## 2014-10-08 DIAGNOSIS — Z6841 Body Mass Index (BMI) 40.0 and over, adult: Secondary | ICD-10-CM | POA: Diagnosis not present

## 2014-10-08 DIAGNOSIS — Z01811 Encounter for preprocedural respiratory examination: Secondary | ICD-10-CM

## 2014-10-08 DIAGNOSIS — G4733 Obstructive sleep apnea (adult) (pediatric): Secondary | ICD-10-CM | POA: Diagnosis present

## 2014-10-08 DIAGNOSIS — M179 Osteoarthritis of knee, unspecified: Secondary | ICD-10-CM | POA: Diagnosis present

## 2014-10-08 DIAGNOSIS — E538 Deficiency of other specified B group vitamins: Secondary | ICD-10-CM | POA: Diagnosis present

## 2014-10-08 DIAGNOSIS — E669 Obesity, unspecified: Secondary | ICD-10-CM | POA: Diagnosis present

## 2014-10-08 DIAGNOSIS — M1712 Unilateral primary osteoarthritis, left knee: Secondary | ICD-10-CM | POA: Diagnosis present

## 2014-10-08 DIAGNOSIS — M8580 Other specified disorders of bone density and structure, unspecified site: Secondary | ICD-10-CM | POA: Diagnosis present

## 2014-10-08 DIAGNOSIS — Z01812 Encounter for preprocedural laboratory examination: Secondary | ICD-10-CM

## 2014-10-08 DIAGNOSIS — K219 Gastro-esophageal reflux disease without esophagitis: Secondary | ICD-10-CM | POA: Diagnosis present

## 2014-10-08 DIAGNOSIS — M1710 Unilateral primary osteoarthritis, unspecified knee: Secondary | ICD-10-CM | POA: Diagnosis present

## 2014-10-08 HISTORY — PX: TOTAL KNEE ARTHROPLASTY: SHX125

## 2014-10-08 SURGERY — ARTHROPLASTY, KNEE, TOTAL
Anesthesia: Regional | Site: Knee | Laterality: Left

## 2014-10-08 MED ORDER — MIDAZOLAM HCL 5 MG/5ML IJ SOLN
INTRAMUSCULAR | Status: DC | PRN
  Administered 2014-10-08 (×2): 1 mg via INTRAVENOUS

## 2014-10-08 MED ORDER — NEOSTIGMINE METHYLSULFATE 10 MG/10ML IV SOLN
INTRAVENOUS | Status: AC
  Filled 2014-10-08: qty 1

## 2014-10-08 MED ORDER — METHOCARBAMOL 1000 MG/10ML IJ SOLN
500.0000 mg | Freq: Four times a day (QID) | INTRAVENOUS | Status: DC | PRN
  Filled 2014-10-08: qty 5

## 2014-10-08 MED ORDER — LIDOCAINE HCL (CARDIAC) 20 MG/ML IV SOLN
INTRAVENOUS | Status: DC | PRN
  Administered 2014-10-08: 100 mg via INTRAVENOUS

## 2014-10-08 MED ORDER — LIDOCAINE HCL (CARDIAC) 20 MG/ML IV SOLN
INTRAVENOUS | Status: AC
  Filled 2014-10-08: qty 5

## 2014-10-08 MED ORDER — METHOCARBAMOL 500 MG PO TABS
500.0000 mg | ORAL_TABLET | Freq: Four times a day (QID) | ORAL | Status: DC | PRN
  Administered 2014-10-09 – 2014-10-11 (×4): 500 mg via ORAL
  Filled 2014-10-08 (×5): qty 1

## 2014-10-08 MED ORDER — METOPROLOL SUCCINATE ER 25 MG PO TB24
25.0000 mg | ORAL_TABLET | Freq: Every day | ORAL | Status: DC
  Filled 2014-10-08: qty 1

## 2014-10-08 MED ORDER — ONDANSETRON HCL 4 MG/2ML IJ SOLN: 4.0000 mg | Freq: Four times a day (QID) | INTRAMUSCULAR | Status: DC | PRN

## 2014-10-08 MED ORDER — METOPROLOL SUCCINATE ER 25 MG PO TB24
25.0000 mg | ORAL_TABLET | Freq: Every day | ORAL | Status: DC
  Administered 2014-10-08 – 2014-10-10 (×3): 25 mg via ORAL
  Filled 2014-10-08 (×4): qty 1

## 2014-10-08 MED ORDER — PROPOFOL 10 MG/ML IV BOLUS
INTRAVENOUS | Status: AC
  Filled 2014-10-08: qty 20

## 2014-10-08 MED ORDER — METOCLOPRAMIDE HCL 10 MG PO TABS: 5.0000 mg | ORAL_TABLET | Freq: Three times a day (TID) | ORAL | Status: DC | PRN

## 2014-10-08 MED ORDER — EPHEDRINE SULFATE 50 MG/ML IJ SOLN
INTRAMUSCULAR | Status: AC
  Filled 2014-10-08: qty 1

## 2014-10-08 MED ORDER — PROPOFOL 10 MG/ML IV BOLUS
INTRAVENOUS | Status: DC | PRN
Start: 2014-10-08 — End: 2014-10-08
  Administered 2014-10-08: 100 mg via INTRAVENOUS
  Administered 2014-10-08: 200 mg via INTRAVENOUS

## 2014-10-08 MED ORDER — NEOSTIGMINE METHYLSULFATE 10 MG/10ML IV SOLN
INTRAVENOUS | Status: DC | PRN
  Administered 2014-10-08: 4 mg via INTRAVENOUS

## 2014-10-08 MED ORDER — ONDANSETRON HCL 4 MG/2ML IJ SOLN
INTRAMUSCULAR | Status: DC | PRN
  Administered 2014-10-08: 4 mg via INTRAVENOUS

## 2014-10-08 MED ORDER — LATANOPROST 0.005 % OP SOLN
1.0000 [drp] | Freq: Every day | OPHTHALMIC | Status: DC
  Administered 2014-10-08 – 2014-10-10 (×3): 1 [drp] via OPHTHALMIC
  Filled 2014-10-08: qty 2.5

## 2014-10-08 MED ORDER — FENTANYL CITRATE 0.05 MG/ML IJ SOLN: 25.0000 ug | INTRAMUSCULAR | Status: DC | PRN

## 2014-10-08 MED ORDER — DEXTROSE 5 % IV SOLN
3.0000 g | Freq: Four times a day (QID) | INTRAVENOUS | Status: AC
  Administered 2014-10-08 (×2): 3 g via INTRAVENOUS
  Filled 2014-10-08 (×3): qty 3000

## 2014-10-08 MED ORDER — SODIUM CHLORIDE 0.9 % IJ SOLN
INTRAMUSCULAR | Status: DC | PRN
  Administered 2014-10-08: 20 mL

## 2014-10-08 MED ORDER — ARTIFICIAL TEARS OP OINT
TOPICAL_OINTMENT | OPHTHALMIC | Status: AC
  Filled 2014-10-08: qty 3.5

## 2014-10-08 MED ORDER — TRANEXAMIC ACID 100 MG/ML IV SOLN
2000.0000 mg | INTRAVENOUS | Status: DC
  Filled 2014-10-08: qty 20

## 2014-10-08 MED ORDER — ACETAMINOPHEN 325 MG PO TABS: 650.0000 mg | ORAL_TABLET | Freq: Four times a day (QID) | ORAL | Status: DC | PRN

## 2014-10-08 MED ORDER — MIRABEGRON ER 50 MG PO TB24
50.0000 mg | ORAL_TABLET | Freq: Every day | ORAL | Status: DC
  Filled 2014-10-08: qty 1

## 2014-10-08 MED ORDER — BISACODYL 5 MG PO TBEC: 5.0000 mg | DELAYED_RELEASE_TABLET | Freq: Every day | ORAL | Status: DC | PRN

## 2014-10-08 MED ORDER — PHENYLEPHRINE 40 MCG/ML (10ML) SYRINGE FOR IV PUSH (FOR BLOOD PRESSURE SUPPORT)
PREFILLED_SYRINGE | INTRAVENOUS | Status: AC
  Filled 2014-10-08: qty 10

## 2014-10-08 MED ORDER — HYDROMORPHONE HCL 1 MG/ML IJ SOLN
0.5000 mg | INTRAMUSCULAR | Status: DC | PRN
  Administered 2014-10-08 – 2014-10-09 (×3): 1 mg via INTRAVENOUS
  Filled 2014-10-08 (×3): qty 1

## 2014-10-08 MED ORDER — LACTATED RINGERS IV SOLN
INTRAVENOUS | Status: DC | PRN
  Administered 2014-10-08 (×2): via INTRAVENOUS

## 2014-10-08 MED ORDER — DEXAMETHASONE SODIUM PHOSPHATE 10 MG/ML IJ SOLN
INTRAMUSCULAR | Status: DC | PRN
  Administered 2014-10-08: 10 mg via INTRAVENOUS

## 2014-10-08 MED ORDER — ROCURONIUM BROMIDE 50 MG/5ML IV SOLN
INTRAVENOUS | Status: AC
  Filled 2014-10-08: qty 1

## 2014-10-08 MED ORDER — FOLIC ACID 1 MG PO TABS
1.0000 mg | ORAL_TABLET | Freq: Every day | ORAL | Status: DC
  Filled 2014-10-08: qty 1

## 2014-10-08 MED ORDER — BUPIVACAINE LIPOSOME 1.3 % IJ SUSP
INTRAMUSCULAR | Status: DC | PRN
  Administered 2014-10-08: 20 mL

## 2014-10-08 MED ORDER — ARTIFICIAL TEARS OP OINT
TOPICAL_OINTMENT | OPHTHALMIC | Status: DC | PRN
  Administered 2014-10-08: 1 via OPHTHALMIC

## 2014-10-08 MED ORDER — BUPIVACAINE LIPOSOME 1.3 % IJ SUSP
20.0000 mL | INTRAMUSCULAR | Status: DC
  Filled 2014-10-08: qty 20

## 2014-10-08 MED ORDER — ONDANSETRON HCL 4 MG PO TABS: 4.0000 mg | ORAL_TABLET | Freq: Four times a day (QID) | ORAL | Status: DC | PRN

## 2014-10-08 MED ORDER — ONDANSETRON HCL 4 MG/2ML IJ SOLN
INTRAMUSCULAR | Status: AC
  Filled 2014-10-08: qty 2

## 2014-10-08 MED ORDER — ROCURONIUM BROMIDE 100 MG/10ML IV SOLN
INTRAVENOUS | Status: DC | PRN
  Administered 2014-10-08: 50 mg via INTRAVENOUS

## 2014-10-08 MED ORDER — PANTOPRAZOLE SODIUM 40 MG PO TBEC
40.0000 mg | DELAYED_RELEASE_TABLET | Freq: Every day | ORAL | Status: DC
  Administered 2014-10-09 – 2014-10-10 (×2): 40 mg via ORAL
  Filled 2014-10-08 (×3): qty 1

## 2014-10-08 MED ORDER — HYDROCODONE-ACETAMINOPHEN 5-325 MG PO TABS
1.0000 | ORAL_TABLET | ORAL | Status: DC | PRN
  Administered 2014-10-09 – 2014-10-11 (×9): 2 via ORAL
  Filled 2014-10-08 (×9): qty 2

## 2014-10-08 MED ORDER — FOLIC ACID 1 MG PO TABS: 1.0000 mg | ORAL_TABLET | Freq: Every day | ORAL | Status: DC

## 2014-10-08 MED ORDER — VITAMIN C 500 MG PO TABS
2000.0000 mg | ORAL_TABLET | Freq: Two times a day (BID) | ORAL | Status: DC
  Administered 2014-10-08 – 2014-10-09 (×2): 1000 mg via ORAL
  Filled 2014-10-08 (×4): qty 4

## 2014-10-08 MED ORDER — LACTATED RINGERS IV SOLN: INTRAVENOUS | Status: DC

## 2014-10-08 MED ORDER — BUPIVACAINE-EPINEPHRINE (PF) 0.5% -1:200000 IJ SOLN
INTRAMUSCULAR | Status: DC | PRN
  Administered 2014-10-08: 30 mL via PERINEURAL

## 2014-10-08 MED ORDER — DOCUSATE SODIUM 100 MG PO CAPS
100.0000 mg | ORAL_CAPSULE | Freq: Two times a day (BID) | ORAL | Status: DC
  Administered 2014-10-08 – 2014-10-10 (×6): 100 mg via ORAL
  Filled 2014-10-08 (×7): qty 1

## 2014-10-08 MED ORDER — METOPROLOL SUCCINATE ER 25 MG PO TB24: 25.0000 mg | ORAL_TABLET | Freq: Every day | ORAL | Status: DC

## 2014-10-08 MED ORDER — FOLIC ACID 1 MG PO TABS
1.0000 mg | ORAL_TABLET | Freq: Every day | ORAL | Status: DC
Start: 2014-10-08 — End: 2014-10-11
  Administered 2014-10-08 – 2014-10-10 (×3): 1 mg via ORAL
  Filled 2014-10-08 (×4): qty 1

## 2014-10-08 MED ORDER — MIRABEGRON ER 50 MG PO TB24
50.0000 mg | ORAL_TABLET | Freq: Every day | ORAL | Status: DC
  Filled 2014-10-08 (×3): qty 1

## 2014-10-08 MED ORDER — PHENYLEPHRINE HCL 10 MG/ML IJ SOLN
INTRAMUSCULAR | Status: DC | PRN
  Administered 2014-10-08: 40 ug via INTRAVENOUS
  Administered 2014-10-08 (×2): 80 ug via INTRAVENOUS

## 2014-10-08 MED ORDER — MIDAZOLAM HCL 2 MG/2ML IJ SOLN
INTRAMUSCULAR | Status: AC
  Filled 2014-10-08: qty 2

## 2014-10-08 MED ORDER — MEPERIDINE HCL 25 MG/ML IJ SOLN: 6.2500 mg | INTRAMUSCULAR | Status: DC | PRN

## 2014-10-08 MED ORDER — SUCCINYLCHOLINE CHLORIDE 20 MG/ML IJ SOLN
INTRAMUSCULAR | Status: AC
  Filled 2014-10-08: qty 1

## 2014-10-08 MED ORDER — PROMETHAZINE HCL 25 MG/ML IJ SOLN: 6.2500 mg | INTRAMUSCULAR | Status: DC | PRN

## 2014-10-08 MED ORDER — SODIUM CHLORIDE 0.9 % IR SOLN
Status: DC | PRN
  Administered 2014-10-08: 2000 mL

## 2014-10-08 MED ORDER — ASPIRIN EC 325 MG PO TBEC
325.0000 mg | DELAYED_RELEASE_TABLET | Freq: Two times a day (BID) | ORAL | Status: DC
  Administered 2014-10-09 – 2014-10-10 (×4): 325 mg via ORAL
  Filled 2014-10-08 (×7): qty 1

## 2014-10-08 MED ORDER — FENTANYL CITRATE 0.05 MG/ML IJ SOLN
INTRAMUSCULAR | Status: DC | PRN
  Administered 2014-10-08: 50 ug via INTRAVENOUS
  Administered 2014-10-08: 100 ug via INTRAVENOUS
  Administered 2014-10-08: 150 ug via INTRAVENOUS

## 2014-10-08 MED ORDER — GLYCOPYRROLATE 0.2 MG/ML IJ SOLN
INTRAMUSCULAR | Status: AC
  Filled 2014-10-08: qty 3

## 2014-10-08 MED ORDER — FENTANYL CITRATE 0.05 MG/ML IJ SOLN
INTRAMUSCULAR | Status: AC
  Filled 2014-10-08: qty 5

## 2014-10-08 MED ORDER — GLYCOPYRROLATE 0.2 MG/ML IJ SOLN
INTRAMUSCULAR | Status: DC | PRN
  Administered 2014-10-08: 0.6 mg via INTRAVENOUS

## 2014-10-08 MED ORDER — ALUM & MAG HYDROXIDE-SIMETH 200-200-20 MG/5ML PO SUSP: 30.0000 mL | ORAL | Status: DC | PRN

## 2014-10-08 MED ORDER — MENTHOL 3 MG MT LOZG: 1.0000 | LOZENGE | OROMUCOSAL | Status: DC | PRN

## 2014-10-08 MED ORDER — TRANEXAMIC ACID 100 MG/ML IV SOLN
2000.0000 mg | INTRAVENOUS | Status: DC | PRN
  Administered 2014-10-08: 2000 mg via TOPICAL

## 2014-10-08 MED ORDER — HYDROCODONE-HOMATROPINE 5-1.5 MG/5ML PO SYRP: 5.0000 mL | ORAL_SOLUTION | Freq: Every evening | ORAL | Status: DC | PRN

## 2014-10-08 MED ORDER — TIMOLOL HEMIHYDRATE 0.5 % OP SOLN
1.0000 [drp] | Freq: Every day | OPHTHALMIC | Status: DC
  Administered 2014-10-08 – 2014-10-10 (×3): 1 [drp] via OPHTHALMIC
  Filled 2014-10-08 (×2): qty 5

## 2014-10-08 MED ORDER — METOCLOPRAMIDE HCL 5 MG/ML IJ SOLN: 5.0000 mg | Freq: Three times a day (TID) | INTRAMUSCULAR | Status: DC | PRN

## 2014-10-08 MED ORDER — SODIUM CHLORIDE 0.9 % IJ SOLN
INTRAMUSCULAR | Status: AC
  Filled 2014-10-08: qty 10

## 2014-10-08 MED ORDER — ACETAMINOPHEN 650 MG RE SUPP: 650.0000 mg | Freq: Four times a day (QID) | RECTAL | Status: DC | PRN

## 2014-10-08 MED ORDER — DIPHENHYDRAMINE HCL 12.5 MG/5ML PO ELIX: 12.5000 mg | ORAL_SOLUTION | ORAL | Status: DC | PRN

## 2014-10-08 MED ORDER — PHENOL 1.4 % MT LIQD: 1.0000 | OROMUCOSAL | Status: DC | PRN

## 2014-10-08 SURGICAL SUPPLY — 69 items
APL SKNCLS STERI-STRIP NONHPOA (GAUZE/BANDAGES/DRESSINGS) ×1
BAG DECANTER FOR FLEXI CONT (MISCELLANEOUS) ×2 IMPLANT
BANDAGE ELASTIC 4 VELCRO ST LF (GAUZE/BANDAGES/DRESSINGS) ×2 IMPLANT
BANDAGE ELASTIC 6 VELCRO ST LF (GAUZE/BANDAGES/DRESSINGS) ×1 IMPLANT
BANDAGE ESMARK 6X9 LF (GAUZE/BANDAGES/DRESSINGS) ×1 IMPLANT
BENZOIN TINCTURE PRP APPL 2/3 (GAUZE/BANDAGES/DRESSINGS) ×2 IMPLANT
BLADE SAGITTAL 25.0X1.19X90 (BLADE) IMPLANT
BLADE SAW SGTL 13.0X1.19X90.0M (BLADE) IMPLANT
BLADE SURG ROTATE 9660 (MISCELLANEOUS) IMPLANT
BNDG CMPR 9X6 STRL LF SNTH (GAUZE/BANDAGES/DRESSINGS) ×1
BNDG CMPR MED 10X6 ELC LF (GAUZE/BANDAGES/DRESSINGS) ×1
BNDG ELASTIC 6X10 VLCR STRL LF (GAUZE/BANDAGES/DRESSINGS) ×2 IMPLANT
BNDG ESMARK 6X9 LF (GAUZE/BANDAGES/DRESSINGS) ×2
BNDG GAUZE ELAST 4 BULKY (GAUZE/BANDAGES/DRESSINGS) ×3 IMPLANT
BOWL SMART MIX CTS (DISPOSABLE) ×2 IMPLANT
CAPT KNEE TOTAL 3 ATTUNE ×1 IMPLANT
CEMENT HV SMART SET (Cement) ×4 IMPLANT
COVER SURGICAL LIGHT HANDLE (MISCELLANEOUS) ×2 IMPLANT
CUFF TOURNIQUET SINGLE 34IN LL (TOURNIQUET CUFF) ×2 IMPLANT
CUFF TOURNIQUET SINGLE 44IN (TOURNIQUET CUFF) IMPLANT
DRAPE EXTREMITY T 121X128X90 (DRAPE) ×2 IMPLANT
DRAPE IMP U-DRAPE 54X76 (DRAPES) ×2 IMPLANT
DRAPE PROXIMA HALF (DRAPES) ×2 IMPLANT
DRAPE U-SHAPE 47X51 STRL (DRAPES) ×2 IMPLANT
DRSG ADAPTIC 3X8 NADH LF (GAUZE/BANDAGES/DRESSINGS) ×2 IMPLANT
DRSG PAD ABDOMINAL 8X10 ST (GAUZE/BANDAGES/DRESSINGS) ×2 IMPLANT
DURAPREP 26ML APPLICATOR (WOUND CARE) ×2 IMPLANT
ELECT REM PT RETURN 9FT ADLT (ELECTROSURGICAL) ×2
ELECTRODE REM PT RTRN 9FT ADLT (ELECTROSURGICAL) ×1 IMPLANT
GAUZE SPONGE 4X4 12PLY STRL (GAUZE/BANDAGES/DRESSINGS) ×2 IMPLANT
GLOVE BIO SURGEON STRL SZ8 (GLOVE) ×4 IMPLANT
GLOVE BIOGEL PI IND STRL 8 (GLOVE) ×2 IMPLANT
GLOVE BIOGEL PI INDICATOR 8 (GLOVE) ×2
GOWN STRL REUS W/ TWL LRG LVL3 (GOWN DISPOSABLE) ×1 IMPLANT
GOWN STRL REUS W/ TWL XL LVL3 (GOWN DISPOSABLE) ×2 IMPLANT
GOWN STRL REUS W/TWL LRG LVL3 (GOWN DISPOSABLE) ×2
GOWN STRL REUS W/TWL XL LVL3 (GOWN DISPOSABLE) ×4
HANDPIECE INTERPULSE COAX TIP (DISPOSABLE) ×2
HOOD PEEL AWAY FACE SHEILD DIS (HOOD) ×4 IMPLANT
IMMOBILIZER KNEE 20 (SOFTGOODS) IMPLANT
IMMOBILIZER KNEE 22 UNIV (SOFTGOODS) ×2 IMPLANT
IMMOBILIZER KNEE 24 THIGH 36 (MISCELLANEOUS) IMPLANT
IMMOBILIZER KNEE 24 UNIV (MISCELLANEOUS)
KIT BASIN OR (CUSTOM PROCEDURE TRAY) ×2 IMPLANT
KIT ROOM TURNOVER OR (KITS) ×2 IMPLANT
MANIFOLD NEPTUNE II (INSTRUMENTS) ×2 IMPLANT
NDL HYPO 21X1 ECLIPSE (NEEDLE) ×1 IMPLANT
NEEDLE 22X1 1/2 (OR ONLY) (NEEDLE) ×1 IMPLANT
NEEDLE HYPO 21X1 ECLIPSE (NEEDLE) ×2 IMPLANT
NS IRRIG 1000ML POUR BTL (IV SOLUTION) ×2 IMPLANT
PACK TOTAL JOINT (CUSTOM PROCEDURE TRAY) ×2 IMPLANT
PACK UNIVERSAL I (CUSTOM PROCEDURE TRAY) ×2 IMPLANT
PAD ARMBOARD 7.5X6 YLW CONV (MISCELLANEOUS) ×4 IMPLANT
SET HNDPC FAN SPRY TIP SCT (DISPOSABLE) ×1 IMPLANT
SPONGE GAUZE 4X4 12PLY STER LF (GAUZE/BANDAGES/DRESSINGS) ×1 IMPLANT
STAPLER VISISTAT 35W (STAPLE) IMPLANT
STRIP CLOSURE SKIN 1/2X4 (GAUZE/BANDAGES/DRESSINGS) ×2 IMPLANT
SUCTION FRAZIER TIP 10 FR DISP (SUCTIONS) IMPLANT
SUT MNCRL AB 3-0 PS2 18 (SUTURE) IMPLANT
SUT VIC AB 0 CT1 27 (SUTURE) ×4
SUT VIC AB 0 CT1 27XBRD ANBCTR (SUTURE) ×2 IMPLANT
SUT VIC AB 2-0 CT1 27 (SUTURE) ×4
SUT VIC AB 2-0 CT1 TAPERPNT 27 (SUTURE) ×2 IMPLANT
SUT VLOC 180 0 24IN GS25 (SUTURE) ×2 IMPLANT
SYR 50ML LL SCALE MARK (SYRINGE) ×2 IMPLANT
TOWEL OR 17X24 6PK STRL BLUE (TOWEL DISPOSABLE) ×2 IMPLANT
TOWEL OR 17X26 10 PK STRL BLUE (TOWEL DISPOSABLE) ×2 IMPLANT
TRAY FOLEY CATH 14FR (SET/KITS/TRAYS/PACK) IMPLANT
WATER STERILE IRR 1000ML POUR (IV SOLUTION) ×4 IMPLANT

## 2014-10-08 NOTE — Progress Notes (Signed)
Patient has full sensation from the knee down. Able to move bilateral legs and wiggles bilateral toes.

## 2014-10-08 NOTE — Progress Notes (Signed)
Utilization review completed.  

## 2014-10-08 NOTE — Anesthesia Postprocedure Evaluation (Signed)
  Anesthesia Post-op Note  Patient: Cameron [name]  Procedure(s) Performed: Procedure(s): TOTAL LEFT KNEE ARTHROPLASTY (Left)  Patient Location: PACU  Anesthesia Type:General  Level of Consciousness: awake and alert   Airway and Oxygen Therapy: Patient Spontanous Breathing and Patient connected to nasal cannula oxygen  Post-op Pain: mild  Post-op Assessment: Post-op Vital signs reviewed, Patient's Cardiovascular Status Stable, Respiratory Function Stable, Patent Airway and No signs of Nausea or vomiting  Post-op Vital Signs: Reviewed and stable  Last Vitals:  Filed Vitals:   10/08/14 1020  BP: 114/58  Pulse: 96  Temp: 36.6 C  Resp: 18    Complications: No apparent anesthesia complications

## 2014-10-08 NOTE — Transfer of Care (Signed)
Immediate Anesthesia Transfer of Care Note  Patient: Cameron [name]  Procedure(s) Performed: Procedure(s): TOTAL LEFT KNEE ARTHROPLASTY (Left)  Patient Location: PACU  Anesthesia Type:General  Level of Consciousness: awake, alert  and oriented  Airway & Oxygen Therapy: Patient Spontanous Breathing and Patient connected to nasal cannula oxygen  Post-op Assessment: Report given to PACU RN and Post -op Vital signs reviewed and stable  Post vital signs: Reviewed and stable  Complications: No apparent anesthesia complications

## 2014-10-08 NOTE — Anesthesia Preprocedure Evaluation (Addendum)
Anesthesia Evaluation    Reviewed: Allergy & Precautions, H&P , Patient's Chart, lab work & pertinent test results, reviewed documented beta blocker date and time   Airway Mallampati: II       Dental  (+) Teeth Intact   Pulmonary  breath sounds clear to auscultation        Cardiovascular hypertension, Rhythm:Regular     Neuro/Psych    GI/Hepatic   Endo/Other    Renal/GU      Musculoskeletal   Abdominal (+) + obese,   Peds  Hematology   Anesthesia Other Findings   Reproductive/Obstetrics                            Anesthesia Physical Anesthesia Plan  ASA: III  Anesthesia Plan: General ETT and Regional   Post-op Pain Management:    Induction:   Airway Management Planned:   Additional Equipment:   Intra-op Plan:   Post-operative Plan:   Informed Consent: I have reviewed the patients History and Physical, chart, labs and discussed the procedure including the risks, benefits and alternatives for the proposed anesthesia with the patient or authorized representative who has indicated his/her understanding and acceptance.   Dental Advisory Given  Plan Discussed with: Anesthesiologist and CRNA  Anesthesia Plan Comments:         Anesthesia Quick Evaluation

## 2014-10-08 NOTE — Evaluation (Signed)
Physical Therapy Evaluation Patient Details Name: Cameron [name] MRN: [id number] DOB: 06-Oct-1946 Today's Date: 10/08/2014   History of Present Illness  Pt admitted for L TKA with hx of RTKA in August. PMHx of OSA, HTN, obesity, GERD, back surgery, glaucoma  Clinical Impression  Pt very pleasant gentleman with wife present throughout who had recent R TKA and now L TKA very familiar with plan, HEP and precautions. Pt moving well for POD#0 and educated for HEP, transfers and progression. Pt will benefit from acute therapy to maximize ROM, strength and gait to return pt to independent level.     Follow Up Recommendations Home health PT    Equipment Recommendations  None recommended by PT    Recommendations for Other Services       Precautions / Restrictions Precautions Precautions: Knee Restrictions Weight Bearing Restrictions: Yes LLE Weight Bearing: Weight bearing as tolerated      Mobility  Bed Mobility Overal bed mobility: Needs Assistance Bed Mobility: Supine to Sit     Supine to sit: Supervision     General bed mobility comments: supervision for safety and lines with pt using bed rail and able to pivot bil LE to EOB without physical assist  Transfers Overall transfer level: Needs assistance   Transfers: Sit to/from Stand Sit to Stand: Min guard         General transfer comment: cues for hand and LLE placement with transfers  Ambulation/Gait Ambulation/Gait assistance: Min guard Ambulation Distance (Feet): 5 Feet Assistive device: Rolling walker (2 wheeled) Gait Pattern/deviations: Step-to pattern;Decreased stance time - left   Gait velocity interpretation: Below normal speed for age/gender General Gait Details: pt moving well with initial transfers and gait   Stairs            Wheelchair Mobility    Modified Rankin (Stroke Patients Only)       Balance Overall balance assessment: Needs assistance   Sitting balance-Leahy Scale: Normal        Standing balance-Leahy Scale: Good                               Pertinent Vitals/Pain Pain Assessment: No/denies pain    Home Living Family/patient expects to be discharged to:: Private residence Living Arrangements: Spouse/significant other   Type of Home: House Home Access: Stairs to enter Entrance Stairs-Rails: None Entrance Stairs-Number of Steps: 2 Home Layout: Two level Home Equipment: Environmental consultant - 2 wheels;Cane - single point Additional Comments: 17"toilet, walk in shower    Prior Function Level of Independence: Independent               Hand Dominance   Dominant Hand: Right    Extremity/Trunk Assessment   Upper Extremity Assessment: Overall WFL for tasks assessed           Lower Extremity Assessment: LLE deficits/detail   LLE Deficits / Details: decreased ROM and strength due to pain but [facility] for activity  Cervical / Trunk Assessment: Normal  Communication   Communication: No difficulties  Cognition Arousal/Alertness: Awake/alert Behavior During Therapy: WFL for tasks assessed/performed Overall Cognitive Status: Within Functional Limits for tasks assessed                      General Comments      Exercises Total Joint Exercises Ankle Circles/Pumps: AROM;Left;10 reps;Seated Quad Sets: AROM;Left;Supine;5 reps Heel Slides: AROM;Left;5 reps;Supine      Assessment/Plan    PT Assessment  Patient needs continued PT services  PT Diagnosis Difficulty walking;Acute pain   PT Problem List Decreased strength;Decreased range of motion;Decreased activity tolerance;Decreased mobility  PT Treatment Interventions Stair training;Gait training;DME instruction;Functional mobility training;Therapeutic activities;Therapeutic exercise;Patient/family education   PT Goals (Current goals can be found in the Care Plan section) Acute Rehab PT Goals Patient Stated Goal: play tennis and ride my [name] PT Goal Formulation: With  patient/family Time For Goal Achievement: 10/15/14 Potential to Achieve Goals: Good    Frequency 7X/week   Barriers to discharge        Co-evaluation               End of Session Equipment Utilized During Treatment: Gait belt Activity Tolerance: Patient tolerated treatment well Patient left: in chair;with call bell/phone within reach;with family/visitor present           Time: 1225-1246 PT Time Calculation (min) (ACUTE ONLY): 21 min   Charges:   PT Evaluation $Initial PT Evaluation Tier I: 1 Procedure PT Treatments $Therapeutic Activity: 8-22 mins   PT G CodesMelford Aase 10/08/2014, 1:43 PM Elwyn Reach, [place]

## 2014-10-08 NOTE — Progress Notes (Signed)
Orthopedic Tech Progress Note Patient Details:  Cameron [name] Nov 22, 1945 [id number] CPM applied to LLE with appropriate settings. OHF applied to bed. Footsie roll provided. CPM Left Knee CPM Left Knee: On Left Knee Flexion (Degrees): 90 Left Knee Extension (Degrees): 0   Asia R Thompson 10/08/2014, 11:14 AM

## 2014-10-08 NOTE — Anesthesia Procedure Notes (Addendum)
Procedure Name: Intubation Date/Time: 10/08/2014 7:45 AM Performed by: Scheryl Darter Pre-anesthesia Checklist: Patient identified, Emergency Drugs available, Suction available, Patient being monitored and Timeout performed Patient Re-evaluated:Patient Re-evaluated prior to inductionOxygen Delivery Method: Circle system utilized Preoxygenation: Pre-oxygenation with 100% oxygen Intubation Type: IV induction Ventilation: Mask ventilation without difficulty and Mask ventilation with difficulty Laryngoscope Size: Miller and 3 Grade View: Grade I Tube type: Oral Tube size: 8.0 mm Number of attempts: 1 Airway Equipment and Method: Stylet Placement Confirmation: ETT inserted through vocal cords under direct vision,  positive ETCO2 and breath sounds checked- equal and bilateral Secured at: 24 cm Dental Injury: Teeth and Oropharynx as per pre-operative assessment    Anesthesia Regional Block:  Adductor canal block  Pre-Anesthetic Checklist: ,, timeout performed, Correct Patient, Correct Site, Correct Laterality, Correct Procedure, Correct Position, site marked, Risks and benefits discussed,  Surgical consent,  Pre-op evaluation,  At surgeon's request and post-op pain management  Laterality: Left  Prep: chloraprep       Needles:   Needle Type: Echogenic Stimulator Needle     Needle Length: 10cm 10 cm Needle Gauge: 22 and 22 G    Additional Needles: Adductor canal block Narrative:  Start time: 10/08/2014 7:00 AM End time: 10/08/2014 7:12 AM Anesthesiologist: Alexis Frock  Additional Notes: L adductor canal block, [place], marcaine .5% 1ml with epi, multiple asp, talked to patient throughout, no complictions

## 2014-10-08 NOTE — Interval H&P Note (Signed)
History and Physical Interval Note:  10/08/2014 7:26 AM  Cameron [name]  has presented today for surgery, with the diagnosis of [place]   The various methods of treatment have been discussed with the patient and family. After consideration of risks, benefits and other options for treatment, the patient has consented to  Procedure(s): TOTAL LEFT KNEE ARTHROPLASTY (Left) as a surgical intervention .  The patient's history has been reviewed, patient examined, no change in status, stable for surgery.  I have reviewed the patient's chart and labs.  Questions were answered to the patient's satisfaction.     Elina Streng G

## 2014-10-08 NOTE — Op Note (Signed)
PREOP DIAGNOSIS: DJD LEFT KNEE POSTOP DIAGNOSIS:  same PROCEDURE: LEFT TKR ANESTHESIA: General and block ATTENDING SURGEON: Nathaniel Wakeley G ASSISTANT: Cameron Dolly PA  INDICATIONS FOR PROCEDURE: Cameron [name] is a 68 y.o. male who has struggled for a long time with pain due to degenerative arthritis of the left knee.  The patient has failed many conservative non-operative measures and at this point has pain which limits the ability to sleep and walk.  The patient is offered total knee replacement.  Informed operative consent was obtained after discussion of possible risks of anesthesia, infection, neurovascular injury, DVT, and death.  The importance of the post-operative rehabilitation protocol to optimize result was stressed extensively with the patient.  SUMMARY OF FINDINGS AND PROCEDURE:  Cameron [name] was taken to the operative suite where under the above anesthesia a left knee replacement was performed.  There were advanced degenerative changes and the bone quality was excellent.  We used the DePuyAttune system and placed size 8 femur, 10 tibia, 41 mm all polyethylene patella, and a size 5 mm spacer.  Cameron Dolly PA-C assisted throughout and was invaluable to the completion of the case in that he helped retract and maintain exposure while I placed the components.  He also helped close thereby minimizing OR time.  The patient was admitted for appropriate post-op care to include perioperative antibiotics and mechanical and pharmacologic measures for DVT prophylaxis.  DESCRIPTION OF PROCEDURE:  Cameron [name] was taken to the operative suite where the above anesthesia was applied.  The patient was positioned supine and prepped and draped in normal sterile fashion.  An appropriate time out was performed.  After the administration of kefzol pre-op antibiotic the leg was elevated and exsanguinated and a tourniquet inflated.  A standard longitudinal incision was made on the anterior knee.   Dissection was carried down to the extensor mechanism.  All appropriate anti-infective measures were used including the pre-operative antibiotic, betadine impregnated drape, and closed hooded exhaust systems for each member of the surgical team.  A medial parapatellar incision was made in the extensor mechanism and the knee cap flipped and the knee flexed.  Some residual meniscal tissues were removed along with any remaining ACL/PCL tissue.  A guide was placed on the tibia and a flat cut was made on it's superior surface.  An intramedullary guide was placed in the femur and was utilized to make anterior and posterior cuts creating an appropriate flexion gap.  A second intramedullary guide was placed in the femur to make a distal cut properly balancing the knee with an extension gap equal to the flexion gap.  The three bones sized to the above mentioned sizes and the appropriate guides were placed and utilized.  A trial reduction was done and the knee easily came to full extension and the patella tracked well on flexion.  The trial components were removed and all bones were cleaned with pulsatile lavage and then dried thoroughly.  Cement was mixed and was pressurized onto the bones followed by placement of the aforementioned components.  Excess cement was trimmed and pressure was held on the components until the cement had hardened.  The tourniquet was deflated and a small amount of bleeding was controlled with cautery and pressure.  The knee was irrigated thoroughly.  The extensor mechanism was re-approximated with V-loc suture in running fashion.  The knee was flexed and the repair was solid.  The subcutaneous tissues were re-approximated with #0 and #2-0 vicryl and the skin closed  with a subcuticular stitch and steristrips.  A sterile dressing was applied.  Intraoperative fluids, EBL, and tourniquet time can be obtained from anesthesia records.  DISPOSITION:  The patient was taken to recovery room in stable  condition and admitted for appropriate post-op care to include peri-operative antibiotic and DVT prophylaxis with mechanical and pharmacologic measures.  Cameron [name] G 10/08/2014, 9:37 AM

## 2014-10-09 ENCOUNTER — Encounter (HOSPITAL_COMMUNITY): Payer: Self-pay | Admitting: Orthopaedic Surgery

## 2014-10-09 LAB — BASIC METABOLIC PANEL
Anion gap: 11 (ref 5–15)
BUN: 10 mg/dL (ref 6–23)
CO2: 28 mEq/L (ref 19–32)
Calcium: 8.8 mg/dL (ref 8.4–10.5)
Chloride: 98 mEq/L (ref 96–112)
Creatinine, Ser: 0.89 mg/dL (ref 0.50–1.35)
GFR calc Af Amer: >90 mL/min (ref >90)
GFR calc non Af Amer: 87 mL/min — ABNORMAL LOW (ref >90)
Glucose, Bld: 132 mg/dL — ABNORMAL HIGH (ref 70–99)
Potassium: 4.3 mEq/L (ref 3.7–5.3)
Sodium: 137 mEq/L (ref 137–147)

## 2014-10-09 LAB — CBC
HCT: 30.2 % — ABNORMAL LOW (ref 39.0–52.0)
Hemoglobin: 10.1 g/dL — ABNORMAL LOW (ref 13.0–17.0)
MCH: 28.1 pg (ref 26.0–34.0)
MCHC: 33.4 g/dL (ref 30.0–36.0)
MCV: 84.1 fL (ref 78.0–100.0)
Platelets: 274 10*3/uL (ref 150–400)
RBC: 3.59 MIL/uL — ABNORMAL LOW (ref 4.22–5.81)
RDW: 13.8 % (ref 11.5–15.5)
WBC: 8 10*3/uL (ref 4.0–10.5)

## 2014-10-09 MED ORDER — VITAMIN C 500 MG PO TABS
1000.0000 mg | ORAL_TABLET | Freq: Two times a day (BID) | ORAL | Status: DC
  Administered 2014-10-09 – 2014-10-10 (×3): 1000 mg via ORAL
  Filled 2014-10-09 (×6): qty 2

## 2014-10-09 NOTE — Plan of Care (Signed)
Problem: Phase I Progression Outcomes Goal: Initial discharge plan identified Outcome: Completed/Met Date Met:  10/09/14 Goal: Other Phase I Outcomes/Goals Outcome: Completed/Met Date Met:  10/09/14  Problem: Phase II Progression Outcomes Goal: Ambulates Outcome: Completed/Met Date Met:  10/09/14 Goal: Discharge plan established Outcome: Completed/Met Date Met:  10/09/14 Goal: Other Phase II Outcomes/Goals Outcome: Completed/Met Date Met:  10/09/14  Problem: Phase III Progression Outcomes Goal: Pain controlled on oral analgesia Outcome: Completed/Met Date Met:  10/09/14 Goal: Ambulates Outcome: Completed/Met Date Met:  10/09/14 Goal: Incision clean - minimal/no drainage Outcome: Completed/Met Date Met:  10/09/14 Goal: Discharge plan remains appropriate-arrangements made Outcome: Completed/Met Date Met:  10/09/14 Goal: Anticoagulant follow-up in place Outcome: Completed/Met Date Met:  10/09/14 Goal: Other Phase III Outcomes/Goals Outcome: Completed/Met Date Met:  10/09/14

## 2014-10-09 NOTE — Progress Notes (Signed)
   10/09/14 2208  BiPAP/CPAP/SIPAP  BiPAP/CPAP/SIPAP Pt Type Adult  Mask Type Nasal pillows  IPAP 18 cmH20  EPAP 18 cmH2O  Oxygen Percent 21 %  BiPAP/CPAP/SIPAP CPAP  Patient Home Equipment No (Except for tubing and nasal pillows)  Auto Titrate No  Patient placed himself on CPAP.

## 2014-10-09 NOTE — Evaluation (Signed)
Occupational Therapy Evaluation Patient Details Name: Cameron [name] MRN: [id number] DOB: 13-Aug-1946 Today's Date: 10/09/2014    History of Present Illness Pt admitted for L TKA with hx of RTKA in August. PMHx of OSA, HTN, obesity, GERD, back surgery, glaucoma   Clinical Impression   Pt is knowledgeable in use of DME and AE for ADL from prior TKA.  Requires min assist for bathing and dressing L foot.  Pt has a reacher, but will likely rely on his wife's assist as he did last time.  Perform ADL transfers at a supervision level.  No further OT needs.    Follow Up Recommendations  No OT follow up    Equipment Recommendations  None recommended by OT    Recommendations for Other Services       Precautions / Restrictions Precautions Precautions: Knee;Fall Required Braces or Orthoses: Knee Immobilizer - Left Knee Immobilizer - Left:  (until discontinued) Restrictions Weight Bearing Restrictions: Yes LLE Weight Bearing: Weight bearing as tolerated      Mobility Bed Mobility Overal bed mobility: Modified Independent Bed Mobility: Supine to Sit;Sit to Supine     Supine to sit: HOB elevated;Modified independent (Device/Increase time) Sit to supine: Modified independent (Device/Increase time)   General bed mobility comments: Increased time to move from supine-to-sit and back; pt able to complete proper sequence without cueing.  Transfers Overall transfer level: Needs assistance Equipment used: Rolling walker (2 wheeled) Transfers: Sit to/from Stand Sit to Stand: Supervision         General transfer comment: good technique    Balance Overall balance assessment: No apparent balance deficits (not formally assessed)                                          ADL Overall ADL's : Needs assistance/impaired Eating/Feeding: Independent   Grooming: Wash/dry hands;Supervision/safety;Standing   Upper Body Bathing: Set up;Sitting   Lower Body Bathing:  Minimal assistance;Sit to/from stand   Upper Body Dressing : Set up;Sitting   Lower Body Dressing: Minimal assistance;Sit to/from stand   Toilet Transfer: Supervision/safety;Comfort height toilet;Ambulation;RW   Toileting- Clothing Manipulation and Hygiene: Supervision/safety;Sit to/from stand       Functional mobility during ADLs: Supervision/safety;Rolling walker General ADL Comments: Pt is aware of AE for LB ADL, not interested. Instructed in safe footwear.     Vision                     Perception     Praxis      Pertinent Vitals/Pain Pain Assessment: 0-10 Pain Score: 9  Pain Location: L knee Pain Descriptors / Indicators: Aching Pain Intervention(s): Repositioned;Ice applied;Premedicated before session     Hand Dominance Right   Extremity/Trunk Assessment Upper Extremity Assessment Upper Extremity Assessment: Overall WFL for tasks assessed   Lower Extremity Assessment Lower Extremity Assessment: Defer to PT evaluation   Cervical / Trunk Assessment Cervical / Trunk Assessment: Normal   Communication Communication Communication: No difficulties   Cognition Arousal/Alertness: Awake/alert Behavior During Therapy: WFL for tasks assessed/performed Overall Cognitive Status: Within Functional Limits for tasks assessed                     General Comments       Exercises       Shoulder Instructions      Home Living Family/patient expects to be discharged to:: Private residence Living  Arrangements: Spouse/significant other Available Help at Discharge: Family;Available 24 hours/day Type of Home: House Home Access: Stairs to enter [employer] of Steps: 2 Entrance Stairs-Rails: None Home Layout: Two level     Bathroom Shower/Tub: Occupational psychologist: Handicapped height Bathroom Accessibility: Yes How Accessible: Accessible via walker Home Equipment: [place]ba - 2 wheels;Cane - single point;Shower seat - built  Hotel manager: Reacher        Prior Functioning/Environment Level of Independence: Independent        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, but relied on wife for LB ADL with last TKA    OT Diagnosis:     OT Problem List:     OT Treatment/Interventions:      OT Goals(Current goals can be found in the care plan section) Acute Rehab OT Goals Patient Stated Goal: play tennis and ride my [place]  OT Frequency:     Barriers to D/C:            Co-evaluation              End of Session CPM Left Knee CPM Left Knee: Off Nurse Communication:  (aware of pt's pain level)  Activity Tolerance: Patient tolerated treatment well Patient left: in bed;with call bell/phone within reach   Time: 1357-1423 OT Time Calculation (min): 26 min Charges:  OT General Charges $OT Visit: 1 Procedure OT Evaluation $Initial OT Evaluation Tier I: 1 Procedure OT Treatments $Self Care/Home Management : 8-22 mins G-Codes:    Malka So 10/09/2014, 3:43 PM [phone]

## 2014-10-09 NOTE — Progress Notes (Signed)
Subjective: 1 Day Post-Op Procedure(s) (LRB): TOTAL LEFT KNEE ARTHROPLASTY (Left)  Activity level:  wbat Diet tolerance:  Eating well Voiding:  ok Patient reports pain as mild.    Objective: Vital signs in last 24 hours: Temp:  [95.7 F (35.4 C)-99.7 F (37.6 C)] 98.7 F (37.1 C) (12/02 0553) Pulse Rate:  [83-102] 93 (12/02 0553) Resp:  [16-21] 20 (12/02 0553) BP: (104-138)/(50-98) 104/50 mmHg (12/02 0553) SpO2:  [95 %-100 %] 100 % (12/02 0553)  Labs: No results for input(s): HGB in the last 72 hours. No results for input(s): WBC, RBC, HCT, PLT in the last 72 hours. No results for input(s): NA, K, CL, CO2, BUN, CREATININE, GLUCOSE, CALCIUM in the last 72 hours. No results for input(s): LABPT, INR in the last 72 hours.  Physical Exam:  Neurologically intact ABD soft Neurovascular intact Sensation intact distally Intact pulses distally Dorsiflexion/Plantar flexion intact Incision: dressing C/D/I and no drainage No cellulitis present Compartment soft  Assessment/Plan:  1 Day Post-Op Procedure(s) (LRB): TOTAL LEFT KNEE ARTHROPLASTY (Left) Advance diet Up with therapy D/C IV fluids Plan for discharge tomorrow Discharge home with home health if doing well and cleared by PT Continue on ASA 325mg  BID x 2 weeks post op Follow up in office 2 weeks post op.     Brittanie Dosanjh, Larwance Sachs 10/09/2014, 7:40 AM

## 2014-10-09 NOTE — Plan of Care (Signed)
Problem: Consults Goal: Diagnosis- Total Joint Replacement Primary Total Knee  Problem: Phase I Progression Outcomes Goal: CMS/Neurovascular status WDL Outcome: Completed/Met Date Met:  10/09/14 Goal: Pain controlled with appropriate interventions Outcome: Completed/Met Date Met:  10/09/14 Goal: Dangle or out of bed evening of surgery Outcome: Completed/Met Date Met:  10/09/14 Goal: Hemodynamically stable Outcome: Completed/Met Date Met:  10/09/14  Problem: Phase II Progression Outcomes Goal: Ambulates Outcome: Progressing Goal: Tolerating diet Outcome: Completed/Met Date Met:  10/09/14

## 2014-10-09 NOTE — Progress Notes (Signed)
Physical Therapy Treatment Patient Details Name: Cameron [name] MRN: [id number] DOB: 1946/05/05 Today's Date: 10/09/2014    History of Present Illness Pt admitted for L TKA with hx of RTKA in August. PMHx of OSA, HTN, obesity, GERD, back surgery, glaucoma    PT Comments    Patient requested not to walk because he hadn't received more pain medication at the time of the session but did agree to complete exercises in bed.  He continues to be motivated and move well.  Will continue to follow to increase ROM, decrease pain, and maximize functional mobility.   Follow Up Recommendations  Home health PT     Equipment Recommendations  None recommended by PT    Recommendations for Other Services       Precautions / Restrictions Precautions Precautions: Knee Precaution Comments: reviewed knee precautions  Restrictions Weight Bearing Restrictions: Yes LLE Weight Bearing: Weight bearing as tolerated    Mobility  Bed Mobility Overal bed mobility: Modified Independent Bed Mobility: Supine to Sit;Sit to Supine     Supine to sit: HOB elevated;Modified independent (Device/Increase time) Sit to supine: Modified independent (Device/Increase time)   General bed mobility comments: Increased time to move from supine-to-sit and back; pt able to complete proper sequence without cueing.  Minor use of bed rails to faciliate mobility.  Transfers Overall transfer level: Needs assistance Equipment used: Rolling walker (2 wheeled) Transfers: Sit to/from Stand Sit to Stand: Supervision         General transfer comment: Min verbal cues for hand placement  Ambulation/Gait Ambulation/Gait assistance: Supervision Ambulation Distance (Feet): 20 Feet Assistive device: Rolling walker (2 wheeled) Gait Pattern/deviations: Step-through pattern;Trunk flexed;Decreased stride length Gait velocity: slightly decreased  Gait velocity interpretation: Below normal speed for age/gender General Gait  Details: Pt ambulated to and from bathroom during session but refused further walk until after pain medication is administered   Stairs            Wheelchair Mobility    Modified Rankin (Stroke Patients Only)       Balance Overall balance assessment: No apparent balance deficits (not formally assessed)           Standing balance-Leahy Scale: Good                      Cognition Arousal/Alertness: Awake/alert Behavior During Therapy: WFL for tasks assessed/performed Overall Cognitive Status: Within Functional Limits for tasks assessed                      Exercises Total Joint Exercises Ankle Circles/Pumps: AROM;Both;20 reps;Supine (HOB elevated) Quad Sets: AROM;Supine;10 reps;Left (HOB elevated) Gluteal Sets: 10 reps (hold 5 sec count) Towel Squeeze: AROM;Both;10 reps;Supine (HOB elevated) Short Arc Quad: AROM;Strengthening;Left;10 reps;Supine (HOB elevated) Heel Slides: AROM;Supine;10 reps;Left (HOB elevated) Hip ABduction/ADduction: AROM;Left;10 reps;Supine (HOB elevated) Straight Leg Raises: AROM;Supine;10 reps;Left Long Arc Quad: AROM;Seated;10 reps;Left Knee Flexion: AROM;Seated;AAROM;Left;15 reps (x10 AROM, x5 AAROM using R leg) Goniometric ROM: AROM -5 to 70 degrees    General Comments General comments (skin integrity, edema, etc.): drainage noted at top of ACE wrap; RN made aware       Pertinent Vitals/Pain Pain Assessment: 0-10 Pain Score: 6  Pain Location: Left knee (6 at start of session, 7 at end of session) Pain Descriptors / Indicators: Aching Pain Intervention(s): Limited activity within patient's tolerance;Monitored during session    Home Living  Prior Function            PT Goals (current goals can now be found in the care plan section) Acute Rehab PT Goals Patient Stated Goal: play tennis and ride my Cameron [name] PT Goal Formulation: With patient/family Time For Goal Achievement:  10/15/14 Potential to Achieve Goals: Good Progress towards PT goals: Progressing toward goals    Frequency  7X/week    PT Plan Current plan remains appropriate    Co-evaluation             End of Session Equipment Utilized During Treatment: Gait belt Activity Tolerance: Patient tolerated treatment well Patient left: in bed;with family/visitor present;with call bell/phone within reach     Time: 3343-5686 PT Time Calculation (min) (ACUTE ONLY): 20 min  Charges:  $Gait Training: 8-22 mins $Therapeutic Exercise: 8-22 mins                    G Codes:      Margarita Croke SPT 10/09/2014, 3:24 PM

## 2014-10-09 NOTE — Plan of Care (Signed)
Problem: Consults Goal: Total Joint Replacement Patient Education See Patient Education Module for education specifics. Outcome: Completed/Met Date Met:  10/09/14 Goal: Diagnosis- Total Joint Replacement Outcome: Completed/Met Date Met:  10/09/14 Primary Total Knee

## 2014-10-09 NOTE — Progress Notes (Signed)
Physical Therapy Treatment Patient Details Name: Cameron [name] MRN: [id number] DOB: 30-Oct-1946 Today's Date: 10/09/2014    History of Present Illness Pt admitted for L TKA with hx of RTKA in August. PMHx of OSA, HTN, obesity, GERD, back surgery, glaucoma    PT Comments    Pt progressing well with PT. Pt very motivated to go home tomorrow. Will cont to follow per POC.   Follow Up Recommendations  Home health PT     Equipment Recommendations  None recommended by PT    Recommendations for Other Services       Precautions / Restrictions Precautions Precautions: Knee Precaution Comments: reviewed knee precautions  Restrictions Weight Bearing Restrictions: Yes LLE Weight Bearing: Weight bearing as tolerated    Mobility  Bed Mobility               General bed mobility comments: pt up in chair and returned to chair  Transfers Overall transfer level: Needs assistance Equipment used: Rolling walker (2 wheeled) Transfers: Sit to/from Stand Sit to Stand: Supervision         General transfer comment: min cues for hand placement  Ambulation/Gait Ambulation/Gait assistance: Supervision Ambulation Distance (Feet): 150 Feet Assistive device: Rolling walker (2 wheeled) Gait Pattern/deviations: Step-through pattern;Trunk flexed;Wide base of support Gait velocity: slightly decreased  Gait velocity interpretation: Below normal speed for age/gender General Gait Details: pt able to progress to step through gt; min cues for upright posture and safety with RW   Stairs            Wheelchair Mobility    Modified Rankin (Stroke Patients Only)       Balance Overall balance assessment: No apparent balance deficits (not formally assessed)           Standing balance-Leahy Scale: Good                      Cognition Arousal/Alertness: Awake/alert Behavior During Therapy: WFL for tasks assessed/performed Overall Cognitive Status: Within Functional  Limits for tasks assessed                      Exercises Total Joint Exercises Ankle Circles/Pumps: AROM;Left;10 reps;Seated Quad Sets: AROM;Left;Supine;10 reps Gluteal Sets: 10 reps (hold 5 sec count) Short Arc Quad: AROM;Strengthening;Left;10 reps;Seated Hip ABduction/ADduction: AROM;Strengthening;Left;10 reps;Seated Straight Leg Raises: 5 reps;Seated;Left Long Arc Quad: AROM;Strengthening;10 reps;Left;Seated Knee Flexion: AROM;Left;10 reps;Seated Goniometric ROM: AROM -5 to 70 degrees    General Comments General comments (skin integrity, edema, etc.): drainage noted at top of ACE wrap; RN made aware       Pertinent Vitals/Pain Pain Assessment: 0-10 Pain Score: 3  Pain Location: Lt knee Pain Descriptors / Indicators: Aching Pain Intervention(s): Monitored during session;Premedicated before session;Repositioned;Ice applied    Home Living                      Prior Function            PT Goals (current goals can now be found in the care plan section) Acute Rehab PT Goals Patient Stated Goal: play tennis and ride my Cameron [name] PT Goal Formulation: With patient/family Time For Goal Achievement: 10/15/14 Potential to Achieve Goals: Good Progress towards PT goals: Progressing toward goals    Frequency  7X/week    PT Plan Current plan remains appropriate    Co-evaluation             End of Session Equipment Utilized During Treatment: Gait belt  Patient left: in chair;with call bell/phone within reach     Time: 1117-1141 PT Time Calculation (min) (ACUTE ONLY): 24 min  Charges:  $Gait Training: 8-22 mins $Therapeutic Exercise: 8-22 mins                    G Codes:      [name], [place]  [phone] 10/09/2014, 11:47 AM

## 2014-10-10 LAB — CBC
HCT: 28.4 % — ABNORMAL LOW (ref 39.0–52.0)
Hemoglobin: 9.1 g/dL — ABNORMAL LOW (ref 13.0–17.0)
MCH: 26.6 pg (ref 26.0–34.0)
MCHC: 32 g/dL (ref 30.0–36.0)
MCV: 83 fL (ref 78.0–100.0)
Platelets: 269 10*3/uL (ref 150–400)
RBC: 3.42 MIL/uL — ABNORMAL LOW (ref 4.22–5.81)
RDW: 13.9 % (ref 11.5–15.5)
WBC: 6.3 10*3/uL (ref 4.0–10.5)

## 2014-10-10 MED ORDER — MIRABEGRON ER 50 MG PO TB24
50.0000 mg | ORAL_TABLET | Freq: Every day | ORAL | Status: DC
  Administered 2014-10-10: 50 mg via ORAL
  Filled 2014-10-10 (×2): qty 1

## 2014-10-10 NOTE — Progress Notes (Signed)
Placed patient on CPAP at 18cm

## 2014-10-10 NOTE — Progress Notes (Signed)
Orthopedic Tech Progress Note Patient Details:  Cameron [name] [birth year] [id number] On cpm at 7:00 pm Patient ID: [name], male   DOB: 06-05-1946, 68 y.o.   MRN: [id number]   Braulio Bosch 10/10/2014, 6:59 PM

## 2014-10-10 NOTE — Progress Notes (Signed)
Subjective: 2 Days Post-Op Procedure(s) (LRB): TOTAL LEFT KNEE ARTHROPLASTY (Left)  Activity level:  wbat Diet tolerance:  Eating well Voiding:  ok Patient reports pain as mild.    Objective: Vital signs in last 24 hours: Temp:  [98.9 F (37.2 C)-99.9 F (37.7 C)] 98.9 F (37.2 C) (12/03 0511) Pulse Rate:  [88-95] 88 (12/03 0511) Resp:  [17] 17 (12/03 0511) BP: (120-136)/(64-79) 120/79 mmHg (12/03 0511) SpO2:  [95 %-98 %] 95 % (12/03 0511)  Labs:  Recent Labs  10/09/14 0655 10/10/14 0405  HGB 10.1* 9.1*    Recent Labs  10/09/14 0655 10/10/14 0405  WBC 8.0 6.3  RBC 3.59* 3.42*  HCT 30.2* 28.4*  PLT 274 269    Recent Labs  10/09/14 0655  NA 137  K 4.3  CL 98  CO2 28  BUN 10  CREATININE 0.89  GLUCOSE 132*  CALCIUM 8.8   No results for input(s): LABPT, INR in the last 72 hours.  Physical Exam:  Neurologically intact ABD soft Neurovascular intact Sensation intact distally Intact pulses distally Dorsiflexion/Plantar flexion intact Incision: scant drainage No cellulitis present Compartment soft  Assessment/Plan:  2 Days Post-Op Procedure(s) (LRB): TOTAL LEFT KNEE ARTHROPLASTY (Left) Advance diet Up with therapy Plan for discharge tomorrow Discharge home with home health if continuing to doing well Dressing changed to Aquacel Continue on ASA 325mg  BID x 2 weeks post op Follow up in office 2 weeks post op.     Cameron [name], Larwance Sachs 10/10/2014, 1:41 PM

## 2014-10-10 NOTE — Progress Notes (Signed)
Physical Therapy Treatment Patient Details Name: Cameron [name] MRN: [id number] DOB: 1946-04-20 Today's Date: 10/10/2014    History of Present Illness Pt admitted for L TKA with hx of RTKA in August. PMHx of OSA, HTN, obesity, GERD, back surgery, glaucoma    PT Comments    Patient progressing well with mobility. Tolerated community distance ambulation and transfers Mod I. All goals met. Reviewed HEP and precautions. Encourage ambulation in hallway daily while in hospital to improve strength/mobility. Pt does not require further skilled therapy services. Discharge from therapy.    Follow Up Recommendations  Home health PT     Equipment Recommendations  None recommended by PT    Recommendations for Other Services       Precautions / Restrictions Precautions Precautions: Knee;Fall Precaution Comments: Reviewed HEP and knee precautions. Restrictions Weight Bearing Restrictions: Yes LLE Weight Bearing: Weight bearing as tolerated    Mobility  Bed Mobility               General bed mobility comments: Received walking out of bathroom upon PT arrival.   Transfers Overall transfer level: Needs assistance Equipment used: Rolling walker (2 wheeled) Transfers: Sit to/from Stand Sit to Stand: Modified independent (Device/Increase time)         General transfer comment: Good hand placement and technique.  Ambulation/Gait Ambulation/Gait assistance: Modified independent (Device/Increase time) Ambulation Distance (Feet): 150 Feet (+ 500') Assistive device: Rolling walker (2 wheeled) Gait Pattern/deviations: Step-through pattern;Trunk flexed;Decreased stride length Gait velocity: slightly decreased    General Gait Details: Encouraged increased knee flexion during swing phase for more normalized gait pattern.   Stairs Stairs: Yes Stairs assistance: Supervision Stair Management: Step to pattern;One rail Right Number of Stairs: 12 General stair comments:  Supervision for safety.   Wheelchair Mobility    Modified Rankin (Stroke Patients Only)       Balance Overall balance assessment: Needs assistance   Sitting balance-Leahy Scale: Normal     Standing balance support: During functional activity Standing balance-Leahy Scale: Good                      Cognition Arousal/Alertness: Awake/alert Behavior During Therapy: WFL for tasks assessed/performed Overall Cognitive Status: Within Functional Limits for tasks assessed                      Exercises Total Joint Exercises Heel Slides: AROM;Left;5 reps;Seated (40 sec hold.) Other Exercises Other Exercises: Towel under left ankle to facilitate knee extension stretch with quad sets; ~ 5 minutes     General Comments        Pertinent Vitals/Pain Pain Assessment: 0-10 Pain Score: 3  Pain Location: L knee Pain Descriptors / Indicators: Aching Pain Intervention(s): Monitored during session;Premedicated before session;Repositioned    Home Living                      Prior Function            PT Goals (current goals can now be found in the care plan section) Progress towards PT goals: Goals met/education completed, patient discharged from PT    Frequency  7X/week    PT Plan Current plan remains appropriate    Co-evaluation             End of Session Equipment Utilized During Treatment: Gait belt Activity Tolerance: Patient tolerated treatment well Patient left: in chair;with call bell/phone within reach     Time: 6720-9470 PT Time  Calculation (min) (ACUTE ONLY): 30 min  Charges:  $Gait Training: 8-22 mins $Therapeutic Exercise: 8-22 mins                    G CodesCandy Sledge A 06-Nov-2014, 5:09 PM Candy Sledge, [place], DPT [phone]

## 2014-10-11 ENCOUNTER — Encounter (HOSPITAL_COMMUNITY): Payer: Self-pay | Admitting: Orthopaedic Surgery

## 2014-10-11 LAB — CBC
HCT: 30 % — ABNORMAL LOW (ref 39.0–52.0)
Hemoglobin: 9.7 g/dL — ABNORMAL LOW (ref 13.0–17.0)
MCH: 26.9 pg (ref 26.0–34.0)
MCHC: 32.3 g/dL (ref 30.0–36.0)
MCV: 83.3 fL (ref 78.0–100.0)
Platelets: 271 10*3/uL (ref 150–400)
RBC: 3.6 MIL/uL — ABNORMAL LOW (ref 4.22–5.81)
RDW: 14 % (ref 11.5–15.5)
WBC: 6.1 10*3/uL (ref 4.0–10.5)

## 2014-10-11 MED ORDER — HYDROCODONE-ACETAMINOPHEN 5-325 MG PO TABS: 1.0000 | ORAL_TABLET | ORAL | Status: DC | PRN

## 2014-10-11 MED ORDER — METHOCARBAMOL 500 MG PO TABS: 500.0000 mg | ORAL_TABLET | Freq: Four times a day (QID) | ORAL | Status: DC | PRN

## 2014-10-11 MED ORDER — ASPIRIN 325 MG PO TBEC
325.0000 mg | DELAYED_RELEASE_TABLET | Freq: Two times a day (BID) | ORAL | Status: DC
Start: 2014-10-11 — End: 2015-07-28

## 2014-10-11 NOTE — Progress Notes (Deleted)
Discharge instructions gave to pt and all questions were answered. Pt is ready to discharge.

## 2014-10-11 NOTE — Discharge Summary (Signed)
Patient ID: Cameron [name] MRN: [id number] DOB/AGE: 1946-07-15 68 y.o.  Admit date: 10/08/2014 Discharge date: 10/11/2014  Admission Diagnoses:  Principal Problem:   Primary osteoarthritis of left knee Active Problems:   Obesity   Primary osteoarthritis of knee   Discharge Diagnoses:  Same  Past Medical History  Diagnosis Date  . HTN (hypertension)   . Diverticulosis of colon   . Osteopenia     DEXA 12-2006    . Personal history of colonic polyps 2002    TUBULAR ADENOMA  . Iron deficiency anemia     h/o  . Fatty liver   . Prostatitis, acute 04-2011    admitted to [facility]  . GERD (gastroesophageal reflux disease)     takes Prevacid  . Glaucoma (increased eye pressure)   . Keloid of skin   . Sleep apnea     on CPAP, last study [facility]  . Arthritis     degenerative joint disease, knees     Surgeries: Procedure(s): TOTAL LEFT KNEE ARTHROPLASTY on 10/08/2014   Consultants:    Discharged Condition: Improved  Hospital Course: Cameron [name] is an 68 y.o. male who was admitted 10/08/2014 for operative treatment ofPrimary osteoarthritis of left knee. Patient has severe unremitting pain that affects sleep, daily activities, and work/hobbies. After pre-op Cameron the patient was taken to the operating room on 10/08/2014 and underwent  Procedure(s): TOTAL LEFT KNEE ARTHROPLASTY.    Patient was given perioperative antibiotics: Anti-infectives    Start     Dose/Rate Route Frequency Ordered Stop   10/08/14 1330  ceFAZolin (ANCEF) 3 g in dextrose 5 % 50 mL IVPB     3 g160 mL/hr over 30 Minutes Intravenous Every 6 hours 10/08/14 1137 10/08/14 2208   10/08/14 0600  ceFAZolin (ANCEF) 3 g in dextrose 5 % 50 mL IVPB     3 g160 mL/hr over 30 Minutes Intravenous On call to O.R. 10/07/14 1500 10/08/14 0747       Patient was given sequential compression devices, early ambulation, and chemoprophylaxis to prevent DVT.  Patient benefited maximally from hospital stay and there were no  complications.    Recent vital signs: Patient Vitals for the past 24 hrs:  BP Temp Temp src Pulse Resp SpO2  10/11/14 0624 113/69 mmHg 98.6 F (37 C) Oral 79 16 99 %  10/11/14 0400 - - - - 18 94 %  10/11/14 0000 - - - - 18 94 %  10/10/14 2100 123/72 mmHg 98.7 F (37.1 C) - 93 18 94 %  10/10/14 2000 - - - - 18 94 %  10/10/14 1436 120/78 mmHg 99.5 F (37.5 C) - 96 18 95 %     Recent laboratory studies:  Recent Labs  10/09/14 0655 10/10/14 0405 10/11/14 0408  WBC 8.0 6.3 6.1  HGB 10.1* 9.1* 9.7*  HCT 30.2* 28.4* 30.0*  PLT 274 269 271  NA 137  --   --   K 4.3  --   --   CL 98  --   --   CO2 28  --   --   BUN 10  --   --   CREATININE 0.89  --   --   GLUCOSE 132*  --   --   CALCIUM 8.8  --   --      Discharge Medications:     Medication List    STOP taking these medications        aspirin 81 MG tablet  Replaced  by:  aspirin 325 MG EC tablet      TAKE these medications        acetaminophen 500 MG tablet  Commonly known as:  TYLENOL  Take 500-1,000 mg by mouth every 6 (six) hours as needed.     aspirin 325 MG EC tablet  Take 1 tablet (325 mg total) by mouth 2 (two) times daily after a meal.     azithromycin 250 MG tablet  Commonly known as:  ZITHROMAX Z-PAK  2 tabs a day the first day, then 1 tab a day x 4 days     folic acid 1 MG tablet  Commonly known as:  FOLVITE  TAKE 1 TABLET BY MOUTH EVERY DAY     HYDROcodone-acetaminophen 5-325 MG per tablet  Commonly known as:  NORCO/VICODIN  Take 1-2 tablets by mouth every 4 (four) hours as needed (breakthrough pain).     HYDROcodone-homatropine 5-1.5 MG/5ML syrup  Commonly known as:  HYCODAN  Take 5 mLs by mouth at bedtime as needed for cough.     lansoprazole 30 MG capsule  Commonly known as:  PREVACID  Take 30 mg by mouth every morning.     latanoprost 0.005 % ophthalmic solution  Commonly known as:  XALATAN  Place 1 drop into both eyes at bedtime.     methocarbamol 500 MG tablet  Commonly known as:   ROBAXIN  Take 1 tablet (500 mg total) by mouth every 6 (six) hours as needed for muscle spasms.     metoprolol succinate 25 MG 24 hr tablet  Commonly known as:  TOPROL-XL  TAKE ONE-HALF (1/2) TABLET DAILY     MYRBETRIQ 50 MG Tb24 tablet  Generic drug:  mirabegron ER  Take 50 mg by mouth daily.     timolol 0.5 % ophthalmic solution  Commonly known as:  BETIMOL  Place 1 drop into both eyes daily.     vitamin C 1000 MG tablet  Take 1,000 mg by mouth 2 (two) times daily.        Diagnostic Studies: No results found.  Disposition: 06-Home-Health Care Svc      Discharge Instructions    Call MD / Call 911    Complete by:  As directed   If you experience chest pain or shortness of breath, CALL 911 and be transported to the hospital emergency room.  If you develope a fever above 101 F, pus (white drainage) or increased drainage or redness at the wound, or calf pain, call your surgeon's office.     Constipation Prevention    Complete by:  As directed   Drink plenty of fluids.  Prune juice may be helpful.  You may use a stool softener, such as Colace (over the counter) 100 mg twice a day.  Use MiraLax (over the counter) for constipation as needed.     Diet - low sodium heart healthy    Complete by:  As directed      Increase activity slowly as tolerated    Complete by:  As directed            Follow-up Information    Follow up with Hessie Dibble, MD. Call in 2 weeks.   Specialty:  Orthopedic Surgery   Contact information:   [place] [address] [phone]        Signed: Rich Fuchs 10/11/2014, 7:50 AM

## 2014-10-11 NOTE — Progress Notes (Signed)
Discharge instructions gave to pt and all questions were answered. Pt is ready to discharge.

## 2015-01-27 ENCOUNTER — Other Ambulatory Visit: Payer: Self-pay | Admitting: Internal Medicine

## 2015-05-10 ENCOUNTER — Emergency Department (HOSPITAL_BASED_OUTPATIENT_CLINIC_OR_DEPARTMENT_OTHER): Payer: BLUE CROSS/BLUE SHIELD

## 2015-05-10 ENCOUNTER — Encounter (HOSPITAL_BASED_OUTPATIENT_CLINIC_OR_DEPARTMENT_OTHER): Payer: Self-pay | Admitting: *Deleted

## 2015-05-10 ENCOUNTER — Emergency Department (HOSPITAL_BASED_OUTPATIENT_CLINIC_OR_DEPARTMENT_OTHER)
Admission: EM | Admit: 2015-05-10 | Discharge: 2015-05-10 | Disposition: A | Discharge status: Home / Self Care | Payer: BLUE CROSS/BLUE SHIELD | Attending: Emergency Medicine | Admitting: Emergency Medicine

## 2015-05-10 DIAGNOSIS — M545 Low back pain: Secondary | ICD-10-CM | POA: Diagnosis present

## 2015-05-10 DIAGNOSIS — Z9981 Dependence on supplemental oxygen: Secondary | ICD-10-CM | POA: Diagnosis not present

## 2015-05-10 DIAGNOSIS — I10 Essential (primary) hypertension: Secondary | ICD-10-CM | POA: Diagnosis not present

## 2015-05-10 DIAGNOSIS — Z872 Personal history of diseases of the skin and subcutaneous tissue: Secondary | ICD-10-CM | POA: Diagnosis not present

## 2015-05-10 DIAGNOSIS — Z9889 Other specified postprocedural states: Secondary | ICD-10-CM | POA: Insufficient documentation

## 2015-05-10 DIAGNOSIS — K219 Gastro-esophageal reflux disease without esophagitis: Secondary | ICD-10-CM | POA: Diagnosis not present

## 2015-05-10 DIAGNOSIS — H409 Unspecified glaucoma: Secondary | ICD-10-CM | POA: Insufficient documentation

## 2015-05-10 DIAGNOSIS — Z87438 Personal history of other diseases of male genital organs: Secondary | ICD-10-CM | POA: Insufficient documentation

## 2015-05-10 DIAGNOSIS — Z7982 Long term (current) use of aspirin: Secondary | ICD-10-CM | POA: Insufficient documentation

## 2015-05-10 DIAGNOSIS — M47816 Spondylosis without myelopathy or radiculopathy, lumbar region: Secondary | ICD-10-CM

## 2015-05-10 DIAGNOSIS — M1990 Unspecified osteoarthritis, unspecified site: Secondary | ICD-10-CM | POA: Insufficient documentation

## 2015-05-10 DIAGNOSIS — Z86010 Personal history of colonic polyps: Secondary | ICD-10-CM | POA: Insufficient documentation

## 2015-05-10 DIAGNOSIS — Z79899 Other long term (current) drug therapy: Secondary | ICD-10-CM | POA: Diagnosis not present

## 2015-05-10 DIAGNOSIS — M47896 Other spondylosis, lumbar region: Secondary | ICD-10-CM | POA: Diagnosis not present

## 2015-05-10 DIAGNOSIS — G4730 Sleep apnea, unspecified: Secondary | ICD-10-CM | POA: Diagnosis not present

## 2015-05-10 LAB — URINALYSIS, ROUTINE W REFLEX MICROSCOPIC
Bilirubin Urine: NEGATIVE
Glucose, UA: NEGATIVE mg/dL
Hgb urine dipstick: NEGATIVE
Ketones, ur: NEGATIVE mg/dL
Leukocytes, UA: NEGATIVE
Nitrite: NEGATIVE
Protein, ur: NEGATIVE mg/dL
Specific Gravity, Urine: 1.028 (ref 1.005–1.030)
Urobilinogen, UA: 1 mg/dL (ref 0.0–1.0)
pH: 6.5 (ref 5.0–8.0)

## 2015-05-10 MED ORDER — OXYCODONE-ACETAMINOPHEN 5-325 MG PO TABS
2.0000 | ORAL_TABLET | Freq: Once | ORAL | Status: AC
  Administered 2015-05-10: 2 via ORAL
  Filled 2015-05-10: qty 2

## 2015-05-10 MED ORDER — OXYCODONE-ACETAMINOPHEN 5-325 MG PO TABS: 2.0000 | ORAL_TABLET | ORAL | Status: DC | PRN

## 2015-05-10 MED ORDER — MELOXICAM 7.5 MG PO TABS: 7.5000 mg | ORAL_TABLET | Freq: Every day | ORAL | Status: DC

## 2015-05-10 NOTE — ED Notes (Signed)
Pt reports lower right sided back pain x 3 days.  States worsening of the pain last night.  Denies urinary symptoms. Worsening pain with movement.

## 2015-05-10 NOTE — ED Notes (Signed)
Pt c/o right lower side pain, denies back or abdominal pain.  Pain improved somewhat with position.  Pt unable to stand straight upright without severe pain.

## 2015-05-10 NOTE — Discharge Instructions (Signed)

## 2015-05-10 NOTE — ED Provider Notes (Signed)
CSN: [id number]     Arrival date & time 05/10/15  1754 History   First MD Initiated Contact with Patient 05/10/15 1837     Chief Complaint  Patient presents with  . Back Pain     (Consider location/radiation/quality/duration/timing/severity/associated sxs/prior Treatment) Patient is a 69 y.o. male presenting with back pain.  Back Pain Location:  Lumbar spine Quality:  Aching Pain severity:  Moderate Pain is:  Same all the time Onset quality:  Gradual Timing:  Constant Progression:  Worsening Chronicity:  New Context: recent illness   Relieved by:  Nothing Worsened by:  Nothing tried Ineffective treatments:  None tried Associated symptoms: abdominal pain   Associated symptoms: no leg pain, no numbness and no paresthesias   Risk factors: no recent surgery     Past Medical History  Diagnosis Date  . HTN (hypertension)   . Diverticulosis of colon   . Osteopenia     DEXA 12-2006    . Personal history of colonic polyps 2002    TUBULAR ADENOMA  . Iron deficiency anemia     h/o  . Fatty liver   . Prostatitis, acute 04-2011    admitted to [facility]  . GERD (gastroesophageal reflux disease)     takes Prevacid  . Glaucoma (increased eye pressure)   . Keloid of skin   . Sleep apnea     on CPAP, last study [facility]  . Arthritis     degenerative joint disease, knees    Past Surgical History  Procedure Laterality Date  . Cholecystectomy  2003  . Back surgery  2003  . Colonoscopy    . Total knee arthroplasty Right 06/11/2014    Procedure: TOTAL KNEE ARTHROPLASTY;  Surgeon: Hessie Dibble, MD;  Location: [place];  Service: Orthopedics;  Laterality: Right;  . Total knee arthroplasty Left 10/08/2014    DR DALLDORF  . Total knee arthroplasty Left 10/08/2014    Procedure: TOTAL LEFT KNEE ARTHROPLASTY;  Surgeon: Hessie Dibble, MD;  Location: [place];  Service: Orthopedics;  Laterality: Left;   Family History  Problem Relation Age of Onset  . Colon cancer Maternal Uncle   . Colon cancer  Other     grandfather  . Prostate cancer Neg Hx   . Heart attack Neg Hx   . Hypertension Neg Hx   . Diabetes Neg Hx   . Stroke Other     maternal aunt in her 27s  . Dementia Mother   . Diverticulitis Father    History  Substance Use Topics  . Smoking status: Never Smoker   . Smokeless tobacco: Never Used  . Alcohol Use: Yes     Comment: socially    Review of Systems  Gastrointestinal: Positive for abdominal pain.  Musculoskeletal: Positive for back pain.  Neurological: Negative for numbness and paresthesias.  All other systems reviewed and are negative.     Allergies  Chlorhexidine gluconate; Ciprofloxacin hcl; and Clindamycin  Home Medications   Prior to Admission medications   Medication Sig Start Date End Date Taking? Authorizing Provider  acetaminophen (TYLENOL) 500 MG tablet Take 500-1,000 mg by mouth every 6 (six) hours as needed.     Historical Provider, MD  Ascorbic Acid (VITAMIN C) 1000 MG tablet Take 1,000 mg by mouth 2 (two) times daily.     Historical Provider, MD  aspirin EC 325 MG EC tablet Take 1 tablet (325 mg total) by mouth 2 (two) times daily after a meal. 10/11/14   Loni Dolly,  PA-C  folic acid (FOLVITE) 1 MG tablet TAKE 1 TABLET BY MOUTH EVERY DAY 01/27/15   Colon Branch, MD  lansoprazole (PREVACID) 30 MG capsule Take 30 mg by mouth every morning.    Historical Provider, MD  latanoprost (XALATAN) 0.005 % ophthalmic solution Place 1 drop into both eyes at bedtime.  05/13/14   Historical Provider, MD  metoprolol succinate (TOPROL-XL) 25 MG 24 hr tablet TAKE ONE-HALF (1/2) TABLET DAILY Patient taking differently: One tablet daily    Colon Branch, MD  MYRBETRIQ 50 MG TB24 tablet Take 50 mg by mouth daily.  04/15/14   Historical Provider, MD  timolol (BETIMOL) 0.5 % ophthalmic solution Place 1 drop into both eyes daily.     Historical Provider, MD   BP 138/89 mmHg  Pulse 80  Temp(Src) 98.5 F (36.9 C) (Oral)  Resp 20  Ht 6' (1.829 m)  Wt 295 lb (133.811 kg)   BMI 40.00 kg/m2  SpO2 97% Physical Exam  Constitutional: He appears well-developed and well-nourished.  HENT:  Head: Normocephalic.  Nose: Nose normal.  Mouth/Throat: Oropharynx is clear and moist.  Eyes: Pupils are equal, round, and reactive to light.  Neck: Normal range of motion.  Cardiovascular: Normal rate and normal heart sounds.   Pulmonary/Chest: Effort normal.  Abdominal: Soft.  Musculoskeletal: He exhibits tenderness.  Tender ls spine dright side.   Neurological: He is alert.  Skin: Skin is warm.  Nursing note and vitals reviewed.   ED Course  Procedures (including critical care time) Labs Review Labs Reviewed  URINALYSIS, ROUTINE W REFLEX MICROSCOPIC (NOT AT [facility])    Imaging Review No results found.   EKG Interpretation None      MDM  Pt given percocet 2 tablets here.  Xray shows advanced lumbar sponylosis.   Pt advised to see his Orthopaedist.     Final diagnoses:  None    Rx for percocet Rx for meloxicam x 7 days See your Orthopaedist for recheck in 1 week.    Hollace Kinnier Cecil, PA-C 05/10/15 2017  Blanchie Dessert, MD 05/10/15 [phone]

## 2015-06-09 ENCOUNTER — Other Ambulatory Visit: Payer: Self-pay | Admitting: Orthopaedic Surgery

## 2015-06-09 DIAGNOSIS — M545 Low back pain: Secondary | ICD-10-CM

## 2015-06-14 ENCOUNTER — Ambulatory Visit
Admission: RE | Admit: 2015-06-14 | Discharge: 2015-06-14 | Disposition: A | Discharge status: Home / Self Care | Payer: BLUE CROSS/BLUE SHIELD | Source: Ambulatory Visit | Attending: Orthopaedic Surgery | Admitting: Orthopaedic Surgery

## 2015-06-14 DIAGNOSIS — M545 Low back pain: Secondary | ICD-10-CM

## 2015-06-19 ENCOUNTER — Ambulatory Visit
Admission: RE | Admit: 2015-06-19 | Discharge: 2015-06-19 | Disposition: A | Discharge status: Home / Self Care | Payer: BLUE CROSS/BLUE SHIELD | Source: Ambulatory Visit | Attending: Orthopaedic Surgery | Admitting: Orthopaedic Surgery

## 2015-06-19 MED ORDER — GADOBENATE DIMEGLUMINE 529 MG/ML IV SOLN: 8.0000 mL | Freq: Once | INTRAVENOUS | Status: DC | PRN

## 2015-07-28 ENCOUNTER — Ambulatory Visit (INDEPENDENT_AMBULATORY_CARE_PROVIDER_SITE_OTHER): Payer: BLUE CROSS/BLUE SHIELD | Admitting: Internal Medicine

## 2015-07-28 ENCOUNTER — Encounter: Payer: Self-pay | Admitting: Internal Medicine

## 2015-07-28 VITALS — BP 124/80 | HR 68 | Temp 98.1°F | Ht 72.0 in | Wt 306.4 lb

## 2015-07-28 DIAGNOSIS — E538 Deficiency of other specified B group vitamins: Secondary | ICD-10-CM | POA: Diagnosis not present

## 2015-07-28 DIAGNOSIS — Z0000 Encounter for general adult medical examination without abnormal findings: Secondary | ICD-10-CM

## 2015-07-28 DIAGNOSIS — Z09 Encounter for follow-up examination after completed treatment for conditions other than malignant neoplasm: Secondary | ICD-10-CM

## 2015-07-28 DIAGNOSIS — E559 Vitamin D deficiency, unspecified: Secondary | ICD-10-CM

## 2015-07-28 LAB — BASIC METABOLIC PANEL
BUN: 13 mg/dL (ref 6–23)
CO2: 32 mEq/L (ref 19–32)
Calcium: 9.2 mg/dL (ref 8.4–10.5)
Chloride: 105 mEq/L (ref 96–112)
Creatinine, Ser: 0.83 mg/dL (ref 0.40–1.50)
GFR: 118.91 mL/min (ref >60.00)
Glucose, Bld: 99 mg/dL (ref 70–99)
Potassium: 4 mEq/L (ref 3.5–5.1)
Sodium: 143 mEq/L (ref 135–145)

## 2015-07-28 LAB — CBC WITH DIFFERENTIAL/PLATELET
Basophils Absolute: 0 10*3/uL (ref 0.0–0.1)
Basophils Relative: 0.4 % (ref 0.0–3.0)
Eosinophils Absolute: 0.2 10*3/uL (ref 0.0–0.7)
Eosinophils Relative: 6 % — ABNORMAL HIGH (ref 0.0–5.0)
HCT: 38.6 % — ABNORMAL LOW (ref 39.0–52.0)
Hemoglobin: 12.9 g/dL — ABNORMAL LOW (ref 13.0–17.0)
Lymphocytes Relative: 36.9 % (ref 12.0–46.0)
Lymphs Abs: 1.3 10*3/uL (ref 0.7–4.0)
MCHC: 33.5 g/dL (ref 30.0–36.0)
MCV: 86.1 fl (ref 78.0–100.0)
Monocytes Absolute: 0.6 10*3/uL (ref 0.1–1.0)
Monocytes Relative: 15.5 % — ABNORMAL HIGH (ref 3.0–12.0)
Neutro Abs: 1.5 10*3/uL (ref 1.4–7.7)
Neutrophils Relative %: 41.2 % — ABNORMAL LOW (ref 43.0–77.0)
Platelets: 218 10*3/uL (ref 150.0–400.0)
RBC: 4.48 Mil/uL (ref 4.22–5.81)
RDW: 15.5 % (ref 11.5–15.5)
WBC: 3.6 10*3/uL — ABNORMAL LOW (ref 4.0–10.5)

## 2015-07-28 LAB — LIPID PANEL
Cholesterol: 149 mg/dL (ref 0–200)
HDL: 46.3 mg/dL (ref >39.00)
LDL Cholesterol: 92 mg/dL (ref 0–99)
NonHDL: 102.7
Total CHOL/HDL Ratio: 3
Triglycerides: 54 mg/dL (ref 0.0–149.0)
VLDL: 10.8 mg/dL (ref 0.0–40.0)

## 2015-07-28 LAB — AST: AST: 17 U/L (ref 0–37)

## 2015-07-28 LAB — ALT: ALT: 18 U/L (ref 0–53)

## 2015-07-28 LAB — VITAMIN B12: Vitamin B-12: 212 pg/mL (ref 211–911)

## 2015-07-28 MED ORDER — AZELASTINE HCL 0.1 % NA SOLN: 2.0000 | Freq: Every evening | NASAL | Status: DC | PRN

## 2015-07-28 NOTE — Patient Instructions (Signed)
Get your blood work before you leave   Over-the-counter Flonase--- 2 sprays in each side of the nose every morning Prescribed Astelin --- 2 sprays in each side of the nose every night Call if the cough is not improving gradually.    Next visit  for a  routine visit in 6 months  (15 minutes) Please schedule an appointment at the front desk No need to come back fasting

## 2015-07-28 NOTE — Progress Notes (Signed)
Pre visit review using our clinic review tool, if applicable. No additional management support is needed unless otherwise documented below in the visit note. 

## 2015-07-28 NOTE — Progress Notes (Signed)
Subjective:    Patient ID: Cameron [name], male    DOB: April 23, 1946, 69 y.o.   MRN: [id number]  DOS:  07/28/2015 Type of visit - description : Complete physical exam Interval history: In general feeling well, good compliance with medications.    Review of Systems Constitutional: No fever. No chills. No unexplained wt changes. No unusual sweats  HEENT: No dental problems, no ear discharge, no facial swelling, no voice changes. No eye discharge, no eye  redness , no  intolerance to light   Respiratory: No wheezing , no  difficulty breathing. On and off, persisting cough with some clear sputum production. GERD symptoms well controlled, no wheezing. Admits to some allergies  Cardiovascular: No CP, no leg swelling , no  Palpitations  GI: no nausea, no vomiting, no diarrhea , no  abdominal pain.  No blood in the stools. No dysphagia, no odynophagia    Endocrine: No polyphagia, no polyuria , no polydipsia  GU: No dysuria, gross hematuria, difficulty urinating. No urinary urgency, no frequency.  Musculoskeletal: Occasional left shoulder pain, occasional fifth left finger numbness at nighttime, admits that he sleeps with his elbow flexed  most nights  Skin: No change in the color of the skin, palor , no  Rash  Allergic, immunologic: + environmental allergies with sneezing, postnasal dripping. Symptoms are usually seasonal.  Neurological: No dizziness no  syncope. No headaches. No diplopia, no slurred, no slurred speech, no motor deficits, no facial  Numbness  Hematological: No enlarged lymph nodes, no easy bruising , no unusual bleedings  Psychiatry: No suicidal ideas, no hallucinations, no beavior problems, no confusion.  No unusual/severe anxiety, no depression   Past Medical History  Diagnosis Date  . HTN (hypertension)   . Diverticulosis of colon   . Osteopenia     DEXA 12-2006    . Personal history of colonic polyps 2002    TUBULAR ADENOMA  . Iron deficiency anemia     h/o  . Fatty liver   . Prostatitis, acute 04-2011    admitted to [facility]  . GERD (gastroesophageal reflux disease)     takes Prevacid  . Glaucoma (increased eye pressure)   . Keloid of skin   . Sleep apnea     on CPAP, last study [facility]  . Arthritis     degenerative joint disease, knees   . Lumbar spondylosis     possible right sided radiculopathy with hx of surgery by Dr. Cyndy Freeze in 2003    Past Surgical History  Procedure Laterality Date  . Cholecystectomy  2003  . Back surgery  2003  . Colonoscopy    . Total knee arthroplasty Right 06/11/2014    Procedure: TOTAL KNEE ARTHROPLASTY;  Surgeon: Hessie Dibble, MD;  Location: [facility];  Service: Orthopedics;  Laterality: Right;  . Total knee arthroplasty Left 10/08/2014    DR DALLDORF  . Total knee arthroplasty Left 10/08/2014    Procedure: TOTAL LEFT KNEE ARTHROPLASTY;  Surgeon: Hessie Dibble, MD;  Location: [place];  Service: Orthopedics;  Laterality: Left;    Social History   Social History  . Marital Status: Married    Spouse Name: N/A  . Number of Children: 2  . Years of Education: N/A   Occupational History  . works in [place]  . Smoking status: Never Smoker   . Smokeless tobacco: Never Used  . Alcohol Use: Yes     Comment: socially  . Drug  Use: No  . Sexual Activity: Not on file   Other Topics Concern  . Not on file   Social History Narrative    plays tennis sometimes      Family History  Problem Relation Age of Onset  . Colon cancer Maternal Uncle     dx age 39s  . Colon cancer Other     grandfather, dx age 15  . Prostate cancer Neg Hx   . Heart attack Neg Hx   . Hypertension Neg Hx   . Diabetes Neg Hx   . Stroke Other     maternal aunt in her 12s  . Dementia Mother   . Diverticulitis Father        Medication List       This list is accurate as of: 07/28/15  6:01 PM.  Always use your most recent med list.               aspirin 81 MG tablet  Take 81 mg by mouth  daily.     azelastine 0.1 % nasal spray  Commonly known as:  ASTELIN  Place 2 sprays into both nostrils at bedtime as needed for rhinitis. Use in each nostril as directed     folic acid 1 MG tablet  Commonly known as:  FOLVITE  TAKE 1 TABLET BY MOUTH EVERY DAY     lansoprazole 30 MG capsule  Commonly known as:  PREVACID  Take 30 mg by mouth every morning.     latanoprost 0.005 % ophthalmic solution  Commonly known as:  XALATAN  Place 1 drop into both eyes at bedtime.     metoprolol succinate 25 MG 24 hr tablet  Commonly known as:  TOPROL-XL  TAKE ONE-HALF (1/2) TABLET DAILY     MYRBETRIQ 50 MG Tb24 tablet  Generic drug:  mirabegron ER  Take 50 mg by mouth daily.     timolol 0.5 % ophthalmic solution  Commonly known as:  BETIMOL  Place 1 drop into both eyes daily.     vitamin C 1000 MG tablet  Take 1,000 mg by mouth 2 (two) times daily.           Objective:   Physical Exam BP 124/80 mmHg  Pulse 68  Temp(Src) 98.1 F (36.7 C) (Oral)  Ht 6' (1.829 m)  Wt 306 lb 6 oz (138.971 kg)  BMI 41.54 kg/m2  SpO2 97% General:   Well developed, well nourished . NAD.  HEENT:  Normocephalic . Face symmetric, atraumatic Neck: No thyromegaly Lungs:  CTA B Normal respiratory effort, no intercostal retractions, no accessory muscle use. Heart: RRR,  no murmur.  no pretibial edema bilaterally  Abdomen:  Not distended, soft, non-tender. No rebound or rigidity. No mass,organomegaly Skin: Not pale. Not jaundice Neurologic:  alert & oriented X3.  Speech normal, gait appropriate for age and unassisted Psych--  Cognition and judgment appear intact.  Cooperative with normal attention span and concentration.  Behavior appropriate. No anxious or depressed appearing.    Assessment & Plan:   Problem list > Hypertension OSA on CPAP GERD B12 and vit D def Fatty liver DJD --lumbar spondylosis, back surgery 2003, knee replacements LUTS  h/o prostatitis admitted 2012  [facility] Glaucoma Osteopenia per DEXA 2008 h/o iron deficiency  A/P Vitamin deficiencies: Check labs DJD: Was seen at the ER 2 months ago, saw orthopedic surgery, s/p physical therapy. Better. Also complaining of shoulder pain ( recommend to see ortrho) and a numbness of the fifth left  finger at night (recommend to change positions, he sleeps with his L elbow flexed) Cough : GERD well-controlled, occasional seasonal allergies, recommend consistent use of Flonase and Astepro. Reassess in 6 months, call if not improving

## 2015-07-28 NOTE — Assessment & Plan Note (Signed)
Td 07; zostavax 2014; pnm shot --2015; prevnar - soct may be an issue, wait until medicare starts Flu shot-- at work [facility]: Colonoscopy: 05/08/2004, neg--no polyps  Colonoscopy: 12-2009-- tics Cscope again 05-03-11, polyp, 5 years  Prostate ca screening:  PSA per urology  Diet Discussed Exercise: He is feeling better after back pain 2 months ago, plans to go back to a more active lifestyle

## 2015-07-28 NOTE — Assessment & Plan Note (Signed)
Vitamin deficiencies: Check labs DJD: Was seen at the ER 2 months ago, saw orthopedic surgery, s/p physical therapy. Better. Also complaining of shoulder pain ( recommend to see ortrho) and a numbness of the fifth left finger at night (recommend to change positions, he sleeps with his L elbow flexed) Cough : GERD well-controlled, occasional seasonal allergies, recommend consistent use of Flonase and Astepro. Reassess in 6 months, call if not improving

## 2015-07-31 LAB — VITAMIN D 1,25 DIHYDROXY
Vitamin D 1, 25 (OH)2 Total: 45 pg/mL (ref 18–72)
Vitamin D2 1, 25 (OH)2: <8 pg/mL
Vitamin D3 1, 25 (OH)2: 45 pg/mL

## 2015-08-12 ENCOUNTER — Other Ambulatory Visit: Payer: Self-pay | Admitting: Neurosurgery

## 2015-08-12 DIAGNOSIS — M5126 Other intervertebral disc displacement, lumbar region: Secondary | ICD-10-CM

## 2015-08-13 ENCOUNTER — Other Ambulatory Visit: Payer: Self-pay | Admitting: Neurosurgery

## 2015-08-14 ENCOUNTER — Ambulatory Visit
Admission: RE | Admit: 2015-08-14 | Discharge: 2015-08-14 | Disposition: A | Discharge status: Home / Self Care | Payer: BLUE CROSS/BLUE SHIELD | Source: Ambulatory Visit | Attending: Neurosurgery | Admitting: Neurosurgery

## 2015-08-14 DIAGNOSIS — M5126 Other intervertebral disc displacement, lumbar region: Secondary | ICD-10-CM

## 2015-08-18 ENCOUNTER — Other Ambulatory Visit: Payer: BLUE CROSS/BLUE SHIELD

## 2015-08-18 ENCOUNTER — Ambulatory Visit
Admission: RE | Admit: 2015-08-18 | Discharge: 2015-08-18 | Disposition: A | Discharge status: Home / Self Care | Payer: BLUE CROSS/BLUE SHIELD | Source: Ambulatory Visit | Attending: Neurosurgery | Admitting: Neurosurgery

## 2015-08-18 ENCOUNTER — Encounter: Payer: Self-pay | Admitting: Gastroenterology

## 2015-08-19 ENCOUNTER — Other Ambulatory Visit: Payer: Self-pay | Admitting: Internal Medicine

## 2015-08-21 ENCOUNTER — Encounter (HOSPITAL_COMMUNITY): Payer: Self-pay | Admitting: *Deleted

## 2015-08-21 MED ORDER — MUPIROCIN 2 % EX OINT
1.0000 "application " | TOPICAL_OINTMENT | Freq: Once | CUTANEOUS | Status: AC
  Administered 2015-08-22: 1 via TOPICAL
  Filled 2015-08-21: qty 22

## 2015-08-21 NOTE — Progress Notes (Signed)
Message left for pt to be here at 1200 tomorrow for surgery at 1425

## 2015-08-21 NOTE — Progress Notes (Signed)
Spoke with pt and his wife for pre-op call. Pt denies cardiac history, chest pain or sob.  Pt's wife wants Dr. Christella Noa reminded to use the same type "glue" that he used on pt's last surgery due to the fact pt has hx of keloids.

## 2015-08-22 ENCOUNTER — Encounter (HOSPITAL_COMMUNITY): Payer: Self-pay | Admitting: *Deleted

## 2015-08-22 ENCOUNTER — Encounter (HOSPITAL_COMMUNITY)
Admission: RE | Disposition: A | Discharge status: Home / Self Care | Payer: Self-pay | Source: Ambulatory Visit | Attending: Neurosurgery

## 2015-08-22 ENCOUNTER — Ambulatory Visit (HOSPITAL_COMMUNITY): Payer: BLUE CROSS/BLUE SHIELD | Admitting: Certified Registered"

## 2015-08-22 ENCOUNTER — Inpatient Hospital Stay (HOSPITAL_COMMUNITY)
Admission: RE | Admit: 2015-08-22 | Discharge: 2015-08-24 | DRG: 520 | Disposition: A | Discharge status: Home / Self Care | Payer: BLUE CROSS/BLUE SHIELD | Source: Ambulatory Visit | Attending: Neurosurgery | Admitting: Neurosurgery

## 2015-08-22 ENCOUNTER — Ambulatory Visit (HOSPITAL_COMMUNITY): Payer: BLUE CROSS/BLUE SHIELD

## 2015-08-22 DIAGNOSIS — M4806 Spinal stenosis, lumbar region: Secondary | ICD-10-CM | POA: Diagnosis present

## 2015-08-22 DIAGNOSIS — I10 Essential (primary) hypertension: Secondary | ICD-10-CM | POA: Diagnosis present

## 2015-08-22 DIAGNOSIS — Z7982 Long term (current) use of aspirin: Secondary | ICD-10-CM | POA: Diagnosis not present

## 2015-08-22 DIAGNOSIS — M48061 Spinal stenosis, lumbar region without neurogenic claudication: Secondary | ICD-10-CM

## 2015-08-22 DIAGNOSIS — Z96653 Presence of artificial knee joint, bilateral: Secondary | ICD-10-CM | POA: Diagnosis present

## 2015-08-22 DIAGNOSIS — Z79899 Other long term (current) drug therapy: Secondary | ICD-10-CM

## 2015-08-22 DIAGNOSIS — M5126 Other intervertebral disc displacement, lumbar region: Secondary | ICD-10-CM | POA: Diagnosis present

## 2015-08-22 DIAGNOSIS — M549 Dorsalgia, unspecified: Secondary | ICD-10-CM | POA: Diagnosis present

## 2015-08-22 DIAGNOSIS — Z419 Encounter for procedure for purposes other than remedying health state, unspecified: Secondary | ICD-10-CM

## 2015-08-22 HISTORY — DX: Drug induced constipation: K59.03

## 2015-08-22 HISTORY — PX: LUMBAR LAMINECTOMY/DECOMPRESSION MICRODISCECTOMY: SHX5026

## 2015-08-22 HISTORY — DX: Spinal stenosis, lumbar region without neurogenic claudication: M48.061

## 2015-08-22 LAB — BASIC METABOLIC PANEL
Anion gap: 8 (ref 5–15)
BUN: 10 mg/dL (ref 6–20)
CO2: 28 mmol/L (ref 22–32)
Calcium: 9 mg/dL (ref 8.9–10.3)
Chloride: 101 mmol/L (ref 101–111)
Creatinine, Ser: 0.78 mg/dL (ref 0.61–1.24)
GFR calc Af Amer: >60 mL/min (ref >60)
GFR calc non Af Amer: >60 mL/min (ref >60)
Glucose, Bld: 86 mg/dL (ref 65–99)
Potassium: 4 mmol/L (ref 3.5–5.1)
Sodium: 137 mmol/L (ref 135–145)

## 2015-08-22 LAB — SURGICAL PCR SCREEN
MRSA, PCR: NEGATIVE
Staphylococcus aureus: NEGATIVE

## 2015-08-22 LAB — CBC
HCT: 42.9 % (ref 39.0–52.0)
Hemoglobin: 14.4 g/dL (ref 13.0–17.0)
MCH: 29.1 pg (ref 26.0–34.0)
MCHC: 33.6 g/dL (ref 30.0–36.0)
MCV: 86.8 fL (ref 78.0–100.0)
Platelets: 235 10*3/uL (ref 150–400)
RBC: 4.94 MIL/uL (ref 4.22–5.81)
RDW: 13.9 % (ref 11.5–15.5)
WBC: 4.4 10*3/uL (ref 4.0–10.5)

## 2015-08-22 SURGERY — LUMBAR LAMINECTOMY/DECOMPRESSION MICRODISCECTOMY 2 LEVELS
Anesthesia: General

## 2015-08-22 MED ORDER — PROPOFOL 10 MG/ML IV BOLUS
INTRAVENOUS | Status: AC
  Filled 2015-08-22: qty 20

## 2015-08-22 MED ORDER — NEOSTIGMINE METHYLSULFATE 10 MG/10ML IV SOLN
INTRAVENOUS | Status: DC | PRN
  Administered 2015-08-22: 3 mg via INTRAVENOUS

## 2015-08-22 MED ORDER — ALBUMIN HUMAN 5 % IV SOLN
INTRAVENOUS | Status: DC | PRN
  Administered 2015-08-22 (×2): via INTRAVENOUS

## 2015-08-22 MED ORDER — TIMOLOL MALEATE 0.5 % OP SOLN
1.0000 [drp] | Freq: Every day | OPHTHALMIC | Status: DC
  Filled 2015-08-22: qty 5

## 2015-08-22 MED ORDER — DEXAMETHASONE SODIUM PHOSPHATE 4 MG/ML IJ SOLN
INTRAMUSCULAR | Status: AC
  Filled 2015-08-22: qty 1

## 2015-08-22 MED ORDER — FENTANYL CITRATE (PF) 250 MCG/5ML IJ SOLN
INTRAMUSCULAR | Status: AC
  Filled 2015-08-22: qty 5

## 2015-08-22 MED ORDER — OXYCODONE-ACETAMINOPHEN 5-325 MG PO TABS: 1.0000 | ORAL_TABLET | ORAL | Status: DC | PRN

## 2015-08-22 MED ORDER — THROMBIN 5000 UNITS EX SOLR
OROMUCOSAL | Status: DC | PRN
  Administered 2015-08-22: 10 mL via TOPICAL

## 2015-08-22 MED ORDER — DEXAMETHASONE SODIUM PHOSPHATE 4 MG/ML IJ SOLN
INTRAMUSCULAR | Status: DC | PRN
  Administered 2015-08-22: 4 mg via INTRAVENOUS

## 2015-08-22 MED ORDER — PHENYLEPHRINE HCL 10 MG/ML IJ SOLN
10.0000 mg | INTRAMUSCULAR | Status: DC | PRN
  Administered 2015-08-22: 25 ug/min via INTRAVENOUS

## 2015-08-22 MED ORDER — HYDROMORPHONE HCL 1 MG/ML IJ SOLN
INTRAMUSCULAR | Status: AC
  Filled 2015-08-22: qty 1

## 2015-08-22 MED ORDER — AZELASTINE HCL 0.1 % NA SOLN
2.0000 | Freq: Every evening | NASAL | Status: DC | PRN
  Filled 2015-08-22: qty 30

## 2015-08-22 MED ORDER — SODIUM CHLORIDE 0.9 % IJ SOLN
3.0000 mL | Freq: Two times a day (BID) | INTRAMUSCULAR | Status: DC
  Administered 2015-08-22 – 2015-08-23 (×3): 3 mL via INTRAVENOUS

## 2015-08-22 MED ORDER — ASPIRIN 81 MG PO TABS: 81.0000 mg | ORAL_TABLET | Freq: Every day | ORAL | Status: DC

## 2015-08-22 MED ORDER — METOPROLOL SUCCINATE ER 25 MG PO TB24
12.5000 mg | ORAL_TABLET | Freq: Every day | ORAL | Status: DC
  Administered 2015-08-22 – 2015-08-23 (×2): 12.5 mg via ORAL
  Filled 2015-08-22 (×2): qty 1

## 2015-08-22 MED ORDER — ACETAMINOPHEN 325 MG PO TABS: 650.0000 mg | ORAL_TABLET | ORAL | Status: DC | PRN

## 2015-08-22 MED ORDER — PHENYLEPHRINE HCL 10 MG/ML IJ SOLN
INTRAMUSCULAR | Status: AC
  Filled 2015-08-22: qty 1

## 2015-08-22 MED ORDER — PROMETHAZINE HCL 25 MG/ML IJ SOLN: 6.2500 mg | INTRAMUSCULAR | Status: DC | PRN

## 2015-08-22 MED ORDER — DIAZEPAM 5 MG PO TABS: 5.0000 mg | ORAL_TABLET | Freq: Four times a day (QID) | ORAL | Status: DC | PRN

## 2015-08-22 MED ORDER — LACTATED RINGERS IV SOLN
INTRAVENOUS | Status: DC
  Administered 2015-08-22 (×3): via INTRAVENOUS

## 2015-08-22 MED ORDER — SENNOSIDES-DOCUSATE SODIUM 8.6-50 MG PO TABS: 1.0000 | ORAL_TABLET | Freq: Every evening | ORAL | Status: DC | PRN

## 2015-08-22 MED ORDER — FENTANYL CITRATE (PF) 250 MCG/5ML IJ SOLN
INTRAMUSCULAR | Status: DC | PRN
  Administered 2015-08-22: 50 ug via INTRAVENOUS
  Administered 2015-08-22: 25 ug via INTRAVENOUS
  Administered 2015-08-22 (×4): 50 ug via INTRAVENOUS
  Administered 2015-08-22: 75 ug via INTRAVENOUS
  Administered 2015-08-22: 50 ug via INTRAVENOUS
  Administered 2015-08-22: 100 ug via INTRAVENOUS

## 2015-08-22 MED ORDER — ASPIRIN EC 81 MG PO TBEC
81.0000 mg | DELAYED_RELEASE_TABLET | Freq: Every day | ORAL | Status: DC
Start: 2015-08-23 — End: 2015-08-24
  Administered 2015-08-23 – 2015-08-24 (×2): 81 mg via ORAL
  Filled 2015-08-22 (×2): qty 1

## 2015-08-22 MED ORDER — BISACODYL 5 MG PO TBEC: 5.0000 mg | DELAYED_RELEASE_TABLET | Freq: Every day | ORAL | Status: DC | PRN

## 2015-08-22 MED ORDER — CEFAZOLIN SODIUM-DEXTROSE 2-3 GM-% IV SOLR
INTRAVENOUS | Status: AC
  Filled 2015-08-22: qty 50

## 2015-08-22 MED ORDER — SODIUM CHLORIDE 0.9 % IJ SOLN: 3.0000 mL | INTRAMUSCULAR | Status: DC | PRN

## 2015-08-22 MED ORDER — ROCURONIUM BROMIDE 100 MG/10ML IV SOLN
INTRAVENOUS | Status: DC | PRN
  Administered 2015-08-22: 10 mg via INTRAVENOUS
  Administered 2015-08-22: 50 mg via INTRAVENOUS

## 2015-08-22 MED ORDER — ONDANSETRON HCL 4 MG/2ML IJ SOLN
INTRAMUSCULAR | Status: AC
  Filled 2015-08-22: qty 2

## 2015-08-22 MED ORDER — GLYCOPYRROLATE 0.2 MG/ML IJ SOLN
INTRAMUSCULAR | Status: DC | PRN
  Administered 2015-08-22: .4 mg via INTRAVENOUS

## 2015-08-22 MED ORDER — CEFAZOLIN SODIUM-DEXTROSE 2-3 GM-% IV SOLR
2.0000 g | INTRAVENOUS | Status: AC
  Administered 2015-08-22: 2 g via INTRAVENOUS

## 2015-08-22 MED ORDER — LIDOCAINE-EPINEPHRINE 0.5 %-1:200000 IJ SOLN
INTRAMUSCULAR | Status: DC | PRN
  Administered 2015-08-22: 10 mL

## 2015-08-22 MED ORDER — ONDANSETRON HCL 4 MG/2ML IJ SOLN
INTRAMUSCULAR | Status: DC | PRN
  Administered 2015-08-22: 4 mg via INTRAVENOUS

## 2015-08-22 MED ORDER — SODIUM CHLORIDE 0.9 % IV SOLN: 250.0000 mL | INTRAVENOUS | Status: DC

## 2015-08-22 MED ORDER — ACETAMINOPHEN 650 MG RE SUPP: 650.0000 mg | RECTAL | Status: DC | PRN

## 2015-08-22 MED ORDER — LIDOCAINE HCL (CARDIAC) 20 MG/ML IV SOLN
INTRAVENOUS | Status: DC | PRN
  Administered 2015-08-22: 100 mg via INTRAVENOUS

## 2015-08-22 MED ORDER — PHENYLEPHRINE HCL 10 MG/ML IJ SOLN
INTRAMUSCULAR | Status: DC | PRN
  Administered 2015-08-22: 40 ug via INTRAVENOUS
  Administered 2015-08-22: 80 ug via INTRAVENOUS
  Administered 2015-08-22: 40 ug via INTRAVENOUS
  Administered 2015-08-22 (×2): 80 ug via INTRAVENOUS

## 2015-08-22 MED ORDER — TIMOLOL HEMIHYDRATE 0.5 % OP SOLN: 1.0000 [drp] | Freq: Every day | OPHTHALMIC | Status: DC

## 2015-08-22 MED ORDER — HYDROMORPHONE HCL 1 MG/ML IJ SOLN
0.2500 mg | INTRAMUSCULAR | Status: DC | PRN
  Administered 2015-08-22 (×4): 0.5 mg via INTRAVENOUS

## 2015-08-22 MED ORDER — PHENOL 1.4 % MT LIQD: 1.0000 | OROMUCOSAL | Status: DC | PRN

## 2015-08-22 MED ORDER — FOLIC ACID 1 MG PO TABS
1.0000 mg | ORAL_TABLET | Freq: Every day | ORAL | Status: DC
  Administered 2015-08-24: 1 mg via ORAL
  Filled 2015-08-22: qty 1

## 2015-08-22 MED ORDER — MORPHINE SULFATE (PF) 2 MG/ML IV SOLN
1.0000 mg | INTRAVENOUS | Status: DC | PRN
  Administered 2015-08-22: 2 mg via INTRAVENOUS
  Filled 2015-08-22 (×2): qty 1

## 2015-08-22 MED ORDER — THROMBIN 5000 UNITS EX SOLR
CUTANEOUS | Status: DC | PRN
  Administered 2015-08-22 (×2): 5000 [IU] via TOPICAL

## 2015-08-22 MED ORDER — HYDROCODONE-ACETAMINOPHEN 5-325 MG PO TABS: 1.0000 | ORAL_TABLET | ORAL | Status: DC | PRN

## 2015-08-22 MED ORDER — 0.9 % SODIUM CHLORIDE (POUR BTL) OPTIME
TOPICAL | Status: DC | PRN
  Administered 2015-08-22: 1000 mL

## 2015-08-22 MED ORDER — PROPOFOL 10 MG/ML IV BOLUS
INTRAVENOUS | Status: DC | PRN
  Administered 2015-08-22 (×3): 50 mg via INTRAVENOUS
  Administered 2015-08-22: 200 mg via INTRAVENOUS

## 2015-08-22 MED ORDER — MAGNESIUM CITRATE PO SOLN: 1.0000 | Freq: Once | ORAL | Status: DC | PRN

## 2015-08-22 MED ORDER — PANTOPRAZOLE SODIUM 40 MG PO TBEC
40.0000 mg | DELAYED_RELEASE_TABLET | Freq: Every day | ORAL | Status: DC
Start: 2015-08-23 — End: 2015-08-24
  Administered 2015-08-23 – 2015-08-24 (×2): 40 mg via ORAL
  Filled 2015-08-22 (×2): qty 1

## 2015-08-22 MED ORDER — VITAMIN C 500 MG PO TABS
1000.0000 mg | ORAL_TABLET | Freq: Two times a day (BID) | ORAL | Status: DC
  Administered 2015-08-22 – 2015-08-24 (×4): 1000 mg via ORAL
  Filled 2015-08-22 (×4): qty 2

## 2015-08-22 MED ORDER — CEFAZOLIN SODIUM 1-5 GM-% IV SOLN
1.0000 g | Freq: Three times a day (TID) | INTRAVENOUS | Status: AC
  Administered 2015-08-23 (×2): 1 g via INTRAVENOUS
  Filled 2015-08-22 (×2): qty 50

## 2015-08-22 MED ORDER — MENTHOL 3 MG MT LOZG: 1.0000 | LOZENGE | OROMUCOSAL | Status: DC | PRN

## 2015-08-22 MED ORDER — VITAMIN C 500 MG PO TABS: 1000.0000 mg | ORAL_TABLET | Freq: Two times a day (BID) | ORAL | Status: DC

## 2015-08-22 MED ORDER — LATANOPROST 0.005 % OP SOLN
1.0000 [drp] | Freq: Every day | OPHTHALMIC | Status: DC
  Administered 2015-08-22 – 2015-08-23 (×2): 1 [drp] via OPHTHALMIC
  Filled 2015-08-22: qty 2.5

## 2015-08-22 MED ORDER — HEMOSTATIC AGENTS (NO CHARGE) OPTIME
TOPICAL | Status: DC | PRN
  Administered 2015-08-22: 1 via TOPICAL

## 2015-08-22 MED ORDER — KETOROLAC TROMETHAMINE 30 MG/ML IJ SOLN
30.0000 mg | Freq: Four times a day (QID) | INTRAMUSCULAR | Status: DC
  Administered 2015-08-23 (×3): 30 mg via INTRAVENOUS
  Filled 2015-08-22 (×4): qty 1

## 2015-08-22 MED ORDER — SENNA 8.6 MG PO TABS
1.0000 | ORAL_TABLET | Freq: Two times a day (BID) | ORAL | Status: DC
  Administered 2015-08-22 – 2015-08-24 (×4): 8.6 mg via ORAL
  Filled 2015-08-22 (×4): qty 1

## 2015-08-22 MED ORDER — MIRABEGRON ER 25 MG PO TB24
50.0000 mg | ORAL_TABLET | Freq: Every day | ORAL | Status: DC
  Administered 2015-08-23 – 2015-08-24 (×2): 50 mg via ORAL
  Filled 2015-08-22 (×2): qty 2

## 2015-08-22 MED ORDER — ONDANSETRON HCL 4 MG/2ML IJ SOLN: 4.0000 mg | INTRAMUSCULAR | Status: DC | PRN

## 2015-08-22 MED ORDER — POTASSIUM CHLORIDE IN NACL 20-0.9 MEQ/L-% IV SOLN
INTRAVENOUS | Status: DC
  Administered 2015-08-22: 22:00:00 via INTRAVENOUS
  Filled 2015-08-22: qty 1000

## 2015-08-22 SURGICAL SUPPLY — 57 items
ADH SKN CLS LQ APL DERMABOND (GAUZE/BANDAGES/DRESSINGS) ×1
APL SKNCLS STERI-STRIP NONHPOA (GAUZE/BANDAGES/DRESSINGS)
BAG DECANTER FOR FLEXI CONT (MISCELLANEOUS) ×3 IMPLANT
BENZOIN TINCTURE PRP APPL 2/3 (GAUZE/BANDAGES/DRESSINGS) IMPLANT
BLADE CLIPPER SURG (BLADE) IMPLANT
BUR MATCHSTICK NEURO 3.0 LAGG (BURR) ×3 IMPLANT
BUR ROUND FLUTED 5 RND (BURR) ×1 IMPLANT
BUR ROUND FLUTED 5MM RND (BURR) ×1
CANISTER SUCT 3000ML PPV (MISCELLANEOUS) ×3 IMPLANT
CLOSURE WOUND 1/2 X4 (GAUZE/BANDAGES/DRESSINGS)
DECANTER SPIKE VIAL GLASS SM (MISCELLANEOUS) ×3 IMPLANT
DERMABOND ADHESIVE PROPEN (GAUZE/BANDAGES/DRESSINGS) ×2
DERMABOND ADVANCED .7 DNX6 (GAUZE/BANDAGES/DRESSINGS) IMPLANT
DRAPE LAPAROTOMY 100X72X124 (DRAPES) ×3 IMPLANT
DRAPE MICROSCOPE LEICA (MISCELLANEOUS) ×3 IMPLANT
DRAPE POUCH INSTRU U-SHP 10X18 (DRAPES) ×3 IMPLANT
DRAPE SURG 17X23 STRL (DRAPES) ×3 IMPLANT
DURAPREP 26ML APPLICATOR (WOUND CARE) ×3 IMPLANT
ELECT REM PT RETURN 9FT ADLT (ELECTROSURGICAL) ×3
ELECTRODE REM PT RTRN 9FT ADLT (ELECTROSURGICAL) ×1 IMPLANT
GAUZE SPONGE 4X4 12PLY STRL (GAUZE/BANDAGES/DRESSINGS) IMPLANT
GAUZE SPONGE 4X4 16PLY XRAY LF (GAUZE/BANDAGES/DRESSINGS) IMPLANT
GLOVE BIO SURGEON STRL SZ 6.5 (GLOVE) ×1 IMPLANT
GLOVE BIO SURGEONS STRL SZ 6.5 (GLOVE) ×1
GLOVE BIOGEL PI IND STRL 6.5 (GLOVE) IMPLANT
GLOVE BIOGEL PI INDICATOR 6.5 (GLOVE) ×2
GLOVE ECLIPSE 6.5 STRL STRAW (GLOVE) ×3 IMPLANT
GLOVE EXAM NITRILE LRG STRL (GLOVE) IMPLANT
GLOVE EXAM NITRILE MD LF STRL (GLOVE) IMPLANT
GLOVE EXAM NITRILE XL STR (GLOVE) IMPLANT
GLOVE EXAM NITRILE XS STR PU (GLOVE) IMPLANT
GOWN STRL REUS W/ TWL LRG LVL3 (GOWN DISPOSABLE) ×2 IMPLANT
GOWN STRL REUS W/ TWL XL LVL3 (GOWN DISPOSABLE) IMPLANT
GOWN STRL REUS W/TWL 2XL LVL3 (GOWN DISPOSABLE) IMPLANT
GOWN STRL REUS W/TWL LRG LVL3 (GOWN DISPOSABLE) ×6
GOWN STRL REUS W/TWL XL LVL3 (GOWN DISPOSABLE)
KIT BASIN OR (CUSTOM PROCEDURE TRAY) ×3 IMPLANT
KIT ROOM TURNOVER OR (KITS) ×3 IMPLANT
LIQUID BAND (GAUZE/BANDAGES/DRESSINGS) ×3 IMPLANT
NDL HYPO 25X1 1.5 SAFETY (NEEDLE) ×1 IMPLANT
NDL SPNL 18GX3.5 QUINCKE PK (NEEDLE) IMPLANT
NEEDLE HYPO 25X1 1.5 SAFETY (NEEDLE) ×3 IMPLANT
NEEDLE SPNL 18GX3.5 QUINCKE PK (NEEDLE) ×3 IMPLANT
NS IRRIG 1000ML POUR BTL (IV SOLUTION) ×3 IMPLANT
PACK LAMINECTOMY NEURO (CUSTOM PROCEDURE TRAY) ×3 IMPLANT
PAD ARMBOARD 7.5X6 YLW CONV (MISCELLANEOUS) ×9 IMPLANT
RUBBERBAND STERILE (MISCELLANEOUS) ×6 IMPLANT
SPONGE LAP 4X18 X RAY DECT (DISPOSABLE) IMPLANT
SPONGE SURGIFOAM ABS GEL SZ50 (HEMOSTASIS) ×3 IMPLANT
STRIP CLOSURE SKIN 1/2X4 (GAUZE/BANDAGES/DRESSINGS) IMPLANT
SUT VIC AB 0 CT1 18XCR BRD8 (SUTURE) ×1 IMPLANT
SUT VIC AB 0 CT1 8-18 (SUTURE) ×6
SUT VIC AB 2-0 CT1 18 (SUTURE) ×3 IMPLANT
SUT VIC AB 3-0 SH 8-18 (SUTURE) ×3 IMPLANT
TOWEL OR 17X24 6PK STRL BLUE (TOWEL DISPOSABLE) ×3 IMPLANT
TOWEL OR 17X26 10 PK STRL BLUE (TOWEL DISPOSABLE) ×3 IMPLANT
WATER STERILE IRR 1000ML POUR (IV SOLUTION) ×3 IMPLANT

## 2015-08-22 NOTE — Anesthesia Preprocedure Evaluation (Signed)
Anesthesia Evaluation  Patient identified by MRN, date of birth, ID band Patient awake    Reviewed: Allergy & Precautions, NPO status , Patient's Chart, lab work & pertinent test results  Airway Mallampati: II  TM Distance: >3 FB Neck ROM: Full    Dental   Pulmonary sleep apnea ,    breath sounds clear to auscultation       Cardiovascular hypertension,  Rhythm:Regular Rate:Normal     Neuro/Psych    GI/Hepatic Neg liver ROS, GERD  ,  Endo/Other  negative endocrine ROS  Renal/GU negative Renal ROS     Musculoskeletal   Abdominal   Peds  Hematology   Anesthesia Other Findings   Reproductive/Obstetrics                             Anesthesia Physical Anesthesia Plan  ASA: III  Anesthesia Plan: General   Post-op Pain Management:    Induction: Intravenous  Airway Management Planned: Oral ETT  Additional Equipment:   Intra-op Plan:   Post-operative Plan: Extubation in OR  Informed Consent: I have reviewed the patients History and Physical, chart, labs and discussed the procedure including the risks, benefits and alternatives for the proposed anesthesia with the patient or authorized representative who has indicated his/her understanding and acceptance.   Dental advisory given  Plan Discussed with: CRNA and Anesthesiologist  Anesthesia Plan Comments:         Anesthesia Quick Evaluation

## 2015-08-22 NOTE — Anesthesia Procedure Notes (Signed)
Procedure Name: Intubation Date/Time: 08/22/2015 4:04 PM Performed by: Merrilyn Puma B Pre-anesthesia Checklist: Patient identified, Timeout performed, Emergency Drugs available, Suction available and Patient being monitored Patient Re-evaluated:Patient Re-evaluated prior to inductionOxygen Delivery Method: Circle system utilized Preoxygenation: Pre-oxygenation with 100% oxygen Intubation Type: IV induction and Cricoid Pressure applied Ventilation: Mask ventilation without difficulty and Oral airway inserted - appropriate to patient size Laryngoscope Size: Mac and 4 Tube type: Oral Tube size: 7.5 mm Number of attempts: 1 Placement Confirmation: CO2 detector,  positive ETCO2,  breath sounds checked- equal and bilateral and ETT inserted through vocal cords under direct vision Secured at: 23 cm Tube secured with: Tape Dental Injury: Teeth and Oropharynx as per pre-operative assessment

## 2015-08-22 NOTE — Anesthesia Postprocedure Evaluation (Signed)
  Anesthesia Post-op Note  Patient: Cameron [name]  Procedure(s) Performed: Procedure(s) (LRB): Lumbar two to lumbar three lumbar three to lumbar four microdiskectomy (N/A)  Patient Location: PACU  Anesthesia Type: General  Level of Consciousness: awake and alert   Airway and Oxygen Therapy: Patient Spontanous Breathing  Post-op Pain: mild  Post-op Assessment: Post-op Vital signs reviewed, Patient's Cardiovascular Status Stable, Respiratory Function Stable, Patent Airway and No signs of Nausea or vomiting  Last Vitals:  Filed Vitals:   08/22/15 2012  BP: 140/93  Pulse: 78  Temp: 36.3 C  Resp: 14    Post-op Vital Signs: stable   Complications: No apparent anesthesia complications

## 2015-08-22 NOTE — H&P (Addendum)
BP 136/69 mmHg  Pulse 80  Temp(Src) 98.1 F (36.7 C) (Oral)  Resp 20  Ht 6' (1.829 m)  Wt 135.626 kg (299 lb)  BMI 40.54 kg/m2  SpO2 100%   Cameron [name] is a gentleman whom I had operated on in 2003 for a large herniated disc at 4-5 and 5-1.  I performed at the very least laminectomies and the note was in our system with regards to the discectomy.  He is 69 years of age, works in [employer], applications, and is right handed.  He had done well without any problems until July of this year.  The forth of July weekend he took a motorcycle trip to a convention in [place].  He made the trip down without a problem, started to experience some pain after being in the convention hotel which was a mile away.  He rested for about two days and on his trip back again on the motorcycle he said he felt fine.  The pain persistent to some degree, but never got severe again until about the 16th.  Then he started physical therapy and after six sessions said he had a great deal of improvement.  He was doing well again until about two to three weeks ago when he started having severe pain again.  He had taken a trip to his parents home and had done some work in and around their house and then he started to feel fairly sharp and severe pain at that time, 09/22.  That pain is now worse than it was in July.  He feels no weakness in his right lower extremity, but a severe amount of pain.  He actually was brought into the office in a wheelchair.  He says he can walk, but cannot walk for any long distances without getting severe pain.  He has been taking hydrocodone, which has provided little, if any, relief.      PAST MEDICAL HISTORY:                    Past medical history is significant for hypertension.  He does have a history of glaucoma.      PAST SURGICAL HISTORY:                  He has undergone surgery by me in 01/2002, had a cholecystectomy in 05/2002, and he just had knee replacements in August and 10/2014.       ALLERGIES:                                         He has an allergy to ciprofloxacin, Chlorhexidine, gluconate (which caused a rash), and clindamycin (which also caused a rash and itching).     MEDICATIONS:                         He takes 81 mg of aspirin, azelastine, folic acid, lansoprazole, latanoprost, methocarbamol, Myrbetriq, atenolol, Toprol, vitamin C, and hydrocodone.          DATA:                                                  Mr. Klingensmith on MRI has a large herniated disc, this MRI  being done in July at L2-3, essentially to the right side, and severe stenosis at L3-4 bilaterally.  Conus is otherwise normal.  Previous reason of surgery shows arthritic changes, but he has no herniated discs or neural compression, outside of lateral recessed stenosis.      INTERVAL PFSH:                                  Mother is 63 and has dementia.  Father is deceased.  He does not smoke.  He does drink alcohol.  He does not use illicit drugs.            REVIEW OF SYSTEMS:                        He in his own words says he has back pain and muscle spasms in his right thigh.  Review of systems positive for eyeglasses, hypertension, and swelling in the feet.  He denies allergic, hematologic, endocrine, psychiatric, neurological, skin,  musculoskeletal, genitourinary, gastrointestinal, respiratory, ear, nose, throat, and mouth, and constitutional.  On his pain chart, he lists pain in the right thigh.  He says the pain has increased in intensity.      PHYSICAL EXAMINATION:                    Vital signs are as follows:  Weight 303 pounds, height 72 inches, blood pressure is 146/89, pulse is 71, respiratory rate is 18, temperature is 98.4.  On exam, he does have full strength in the lower extremities.  Reflexes not elicited at the right knee, 1 to 2+ at the left knee, not elicited at the ankles and he also wraps in an Ace wrap.  He has difficulty toe walking, not with getting up on his toes but  with maintaining his balance.  He stumbles when he does it.  He is able to walk on his heels.  Hip flexors are normal on manual exam, though there is certainly a difference between the left and right, the right being weaker than the left.  Pupils equal, round, and reactive to light.  Full extraocular movements.  Full visual fields.  Hearing intact to voice.  Uvula elevates in the midline.  Shoulder shrug is normal.  Tongue protrudes in the midline.  Speech is clear and fluent.  Memory, language, attention span, and fund of knowledge are also normal.       IMPRESSION/PLAN:                             I believe Mr. Maestas would best be helped with surgery.  He is due to receive an injection into the lumbar region and I told him to go forward with that.  If that improves his situation then he would not need surgery, but I also, considering the fact that he has gotten much worse only since 09/22, feel compelled that he has to get another MRI of the lumbar spine to make sure that something has not changed from the scan done in July.  That scan was followed by a period of absolutely no symptoms.  So, I will order the MRI of the lumbar spine.  He will need an open scan.  I also gave him oxycodone without acetaminophen.  I will see him after the injection, as he will contact us.

## 2015-08-22 NOTE — Transfer of Care (Signed)
Immediate Anesthesia Transfer of Care Note  Patient: Cameron [name]  Procedure(s) Performed: Procedure(s) with comments: Lumbar two to lumbar three lumbar three to lumbar four microdiskectomy (N/A) - L23 L34 microdiskectomy  Patient Location: PACU  Anesthesia Type:General  Level of Consciousness: awake  Airway & Oxygen Therapy: Patient Spontanous Breathing and Patient connected to face mask oxygen  Post-op Assessment: Report given to RN and Post -op Vital signs reviewed and stable  Post vital signs: Reviewed and stable  Last Vitals:  Filed Vitals:   08/22/15 1923  BP: 130/67  Pulse: 83  Temp: 37.1 C  Resp: 18    Complications: No apparent anesthesia complications

## 2015-08-22 NOTE — Op Note (Signed)
08/22/2015  7:25 PM  PATIENT:  Cameron [name]  69 y.o. male with a fairly large disc herniation at L2/3 and profound stenosis at L2/3, and L3/4. He has decided to have surgery in order to decompress the thecal sac and remove the disc herniation  PRE-OPERATIVE DIAGNOSIS:  lumbar herniated disc L2/3, lumbar stenosis L3/4  POST-OPERATIVE DIAGNOSIS:  lumbar herniated disc L2/3, lumbar stenosis L3/4  PROCEDURE:  Procedure(s):L2/3 bilateral lumbar decompression with right discetomy L3 laminectomy l4 hemilaminectomy for decompression L3/4 spinal canal   SURGEON:   Surgeon(s): Ashok Pall, MD Eustace Moore, MD  ASSISTANTS:Jones, Shanon Brow  ANESTHESIA:   general  EBL:     BLOOD ADMINISTERED:none  CELL SAVER GIVEN:none  COUNT:per nursing  DRAINS: (1) Hemovact drain(s) in the subfascial plane with  Suction Open   SPECIMEN:  No Specimen  DICTATION: [name] was taken to the operating room, intubated and placed under a general anesthetic without difficulty. He was positioned prone on a Wilson frame with all pressure points padded. His back was prepped and draped in a sterile manner. I opened the skin with a 10 blade and carried the dissection down to the thoracolumbar fascia. I used both sharp dissection and the monopolar cautery to expose the lamina of L2,3, and 4. I confirmed my location with an intraoperative xray.  I used the drill, Kerrison punches, and curettes to perform a laminectomy of L2, and L3, and a hemilaminectomy of L4. I used the punches to remove the ligamentum flavum to expose the thecal sac. I brought the microscope into the operative field and with Dr.Jones's assistance we started our decompression of the spinal canal, thecal sac and L2,3, and L4 root(s). I cauterized epidural veins overlying the disc space at L2/3 then divided them sharply. I opened the disc space with a 15 blade and proceeded with the discectomy. I used pituitary rongeurs, curettes, and other  instruments to remove disc material. We decompressed the thecal sac at L3/4 without a discetomy After the discectomy was completed we inspected the L3 nerve root and felt it was well decompressed. I explored rostrally, laterally, medially, and caudally and was satisfied with the decompression at both levels. I irrigated the wound, then closed in layers. I approximated the thoracolumbar fascia, subcutaneous, and subcuticular planes with vicryl sutures. I used dermabond for a sterile dressing.   PLAN OF CARE: Admit to inpatient   PATIENT DISPOSITION:  PACU - hemodynamically stable.   Delay start of Pharmacological VTE agent (>24hrs) due to surgical blood loss or risk of bleeding:  yes

## 2015-08-23 MED ORDER — KETOROLAC TROMETHAMINE 30 MG/ML IJ SOLN
15.0000 mg | Freq: Four times a day (QID) | INTRAMUSCULAR | Status: DC
  Administered 2015-08-23 – 2015-08-24 (×4): 15 mg via INTRAVENOUS
  Filled 2015-08-23 (×4): qty 1

## 2015-08-23 NOTE — Progress Notes (Signed)
Subjective: Patient reports sore in back  Objective: Vital signs in last 24 hours: Temp:  [97.4 F (36.3 C)-98.8 F (37.1 C)] 98.1 F (36.7 C) (10/15 0534) Pulse Rate:  [74-96] 91 (10/15 0534) Resp:  [11-20] 18 (10/15 0534) BP: (106-140)/(53-93) 106/64 mmHg (10/15 0534) SpO2:  [95 %-100 %] 97 % (10/15 0534) Weight:  [135.626 kg (299 lb)] 135.626 kg (299 lb) (10/14 1313)  Intake/Output from previous day: 10/14 0701 - 10/15 0700 In: 2450 [I.V.:1950; IV Piggyback:500] Out: 1500 [Drains:50; Blood:1450] Intake/Output this shift:    Physical Exam: Full strength both legs.  Dressing min drainage.  Lab Results:  Recent Labs  08/22/15 1353  WBC 4.4  HGB 14.4  HCT 42.9  PLT 235   BMET  Recent Labs  08/22/15 1353  NA 137  K 4.0  CL 101  CO2 28  GLUCOSE 86  BUN 10  CREATININE 0.78  CALCIUM 9.0    Studies/Results: Dg Lumbar Spine 2-3 Views  08/22/2015  CLINICAL DATA:  L2-3 and L3-4 microdiscectomy for disc herniations. Intraoperative localization. EXAM: LUMBAR SPINE - 2-3 VIEW COMPARISON:  MRI lumbar spine 08/14/2015, 06/19/2015. Lumbar spine x-rays 05/10/2015. FINDINGS: Portable lateral images were obtained intraoperatively and submitted for interpretation postoperatively. Initial image obtained at 1623 hr demonstrates the localizer needle superficial to the L3 spinous process. 2nd image obtained at 1635 hr demonstrates a localizer probe between the spinous processes of L3 and L4, directed toward the L3-4 disc. IMPRESSION: L3-4 localized intraoperatively. Electronically Signed   By: Evangeline Dakin M.D.   On: 08/22/2015 16:52    Assessment/Plan: Mobilize today.  Anticipate D/C in AM.  Hospital bed for home.    LOS: 1 day    Peggyann Shoals, MD 08/23/2015, 7:27 AM

## 2015-08-23 NOTE — Evaluation (Signed)
Physical Therapy Evaluation and Discharge Patient Details Name: Cameron [name] MRN: [id number] DOB: 04/28/1946 Today's Date: 08/23/2015   History of Present Illness  Pt is a 69 y/o male admitted s/p L2-3 bilateral lumbar decompression with right discectomy, L3 laminectomy, L4 hemilaminectomy for decompression of L3-4 spinal canal. Operation was on 08/22/15.  Clinical Impression  Patient evaluated by Physical Therapy with no further acute PT needs identified. All education has been completed and the patient has no further questions. At the time of PT eval pt was able to perform all mobility and negotiate stairs with modified independence. Please see below for any follow-up Physial Therapy or equipment needs. PT is signing off. Thank you for this referral.    Follow Up Recommendations No PT follow up;Supervision - Intermittent    Equipment Recommendations  None recommended by PT    Recommendations for Other Services       Precautions / Restrictions Precautions Precautions: Fall;Back Precaution Booklet Issued: Yes (comment) Precaution Comments: Reviewed 3/3 back precautions with pt. Good maintenance during mobillity.  Restrictions Weight Bearing Restrictions: No      Mobility  Bed Mobility Overal bed mobility: Modified Independent                Transfers Overall transfer level: Modified independent Equipment used: None                Ambulation/Gait Ambulation/Gait assistance: Modified independent (Device/Increase time) Ambulation Distance (Feet): 700 Feet Assistive device: None Gait Pattern/deviations: WFL(Within Functional Limits)   Gait velocity interpretation: at or above normal speed for age/gender    Stairs Stairs: Yes Stairs assistance: Modified independent (Device/Increase time) Stair Management: One rail Left;Alternating pattern;Forwards Number of Stairs: 10    Wheelchair Mobility    Modified Rankin (Stroke Patients Only)        Balance Overall balance assessment: No apparent balance deficits (not formally assessed)                                           Pertinent Vitals/Pain Pain Assessment: 0-10 Pain Score: 3  Pain Location: Back    Home Living Family/patient expects to be discharged to:: Private residence Living Arrangements: Spouse/significant other Available Help at Discharge: Family;Available 24 hours/day Type of Home: House Home Access: Stairs to enter Entrance Stairs-Rails: None (Walls on both sides) Entrance Stairs-Number of Steps: 3 Home Layout: Two level Home Equipment: Shower seat - built in;Cane - single point;Adaptive equipment      Prior Function Level of Independence: Independent               Hand Dominance   Dominant Hand: Right    Extremity/Trunk Assessment   Upper Extremity Assessment: Overall WFL for tasks assessed           Lower Extremity Assessment: Overall WFL for tasks assessed      Cervical / Trunk Assessment: Normal  Communication   Communication: No difficulties  Cognition Arousal/Alertness: Awake/alert Behavior During Therapy: WFL for tasks assessed/performed Overall Cognitive Status: Within Functional Limits for tasks assessed                      General Comments      Exercises        Assessment/Plan    PT Assessment Patent does not need any further PT services  PT Diagnosis Acute pain   PT Problem List  PT Treatment Interventions     PT Goals (Current goals can be found in the Care Plan section) Acute Rehab PT Goals PT Goal Formulation: All assessment and education complete, DC therapy    Frequency     Barriers to discharge        Co-evaluation               End of Session   Activity Tolerance: Patient tolerated treatment well Patient left: in chair;with call bell/phone within reach Nurse Communication: Mobility status         Time: 0920-0943 PT Time Calculation (min) (ACUTE  ONLY): 23 min   Charges:   PT Evaluation $Initial PT Evaluation Tier I: 1 Procedure PT Treatments $Gait Training: 8-22 mins   PT G Codes:        Rolinda Roan 20-Sep-2015, 12:24 PM   Rolinda Roan, PT, DPT Acute Rehabilitation Services Pager: [phone]

## 2015-08-23 NOTE — Evaluation (Signed)
Occupational Therapy Evaluation Patient Details Name: Cameron [name] MRN: [id number] DOB: July 29, 1946 Today's Date: 08/23/2015    History of Present Illness Pt is a 69 y/o male admitted s/p L2-3 bilateral lumbar decompression with right discectomy, L3 laminectomy, L4 hemilaminectomy for decompression of L3-4 spinal canal. Operation was on 08/22/15.   Clinical Impression   Pt is performing ADL and mobility at a modified independent level. Educated pt extensively in back precautions related to ADL and IADL.  No further OT needs.    Follow Up Recommendations  No OT follow up    Equipment Recommendations  None recommended by OT    Recommendations for Other Services       Precautions / Restrictions Precautions Precautions: Fall;Back Precaution Booklet Issued: Yes (comment) Precaution Comments: reviewed back precautions related to ADL and IADL      Mobility Bed Mobility Overal bed mobility: Needs Assistance Bed Mobility: Sit to Sidelying;Rolling Rolling: Modified independent (Device/Increase time)       Sit to sidelying: Modified independent (Device/Increase time) General bed mobility comments: pt needed reminders for log roll technique, has a motorized bed at home.  Transfers Overall transfer level: Modified independent Equipment used: None                  Balance                                            ADL Overall ADL's : Modified independent                                       General ADL Comments: Pt able to cross foot over opposite knee to donn and doff socks. Educated pt in use of toilet tongs to avoid twisting with pericare and 2 cup method for toothbrushing.  Pt can rely on wife for IADL and lifting activities.       Vision     Perception     Praxis      Pertinent Vitals/Pain Pain Assessment: Faces Faces Pain Scale: Hurts a little bit Pain Location: back Pain Descriptors / Indicators: Operative  site guarding Pain Intervention(s): Monitored during session     Hand Dominance Right   Extremity/Trunk Assessment Upper Extremity Assessment Upper Extremity Assessment: Overall WFL for tasks assessed   Lower Extremity Assessment Lower Extremity Assessment: Overall WFL for tasks assessed   Cervical / Trunk Assessment Cervical / Trunk Assessment: Normal   Communication Communication Communication: No difficulties   Cognition Arousal/Alertness: Awake/alert Behavior During Therapy: WFL for tasks assessed/performed Overall Cognitive Status: Within Functional Limits for tasks assessed                     General Comments       Exercises       Shoulder Instructions      Home Living Family/patient expects to be discharged to:: Private residence Living Arrangements: Spouse/significant other Available Help at Discharge: Family;Available 24 hours/day Type of Home: House Home Access: Stairs to enter [employer] of Steps: 3 Entrance Stairs-Rails: None Home Layout: Two level Alternate Level Stairs-Number of Steps: Flight Alternate Level Stairs-Rails: Right Bathroom Shower/Tub: Occupational psychologist: Handicapped height Bathroom Accessibility: Yes How Accessible: Accessible via walker Home Equipment: Shower seat - built in;Cane - single point;Adaptive equipment  Adaptive Equipment: Reacher        Prior Functioning/Environment Level of Independence: Independent             OT Diagnosis: Acute pain   OT Problem List:     OT Treatment/Interventions:      OT Goals(Current goals can be found in the care plan section)    OT Frequency:     Barriers to D/C:            Co-evaluation              End of Session    Activity Tolerance: Patient tolerated treatment well Patient left: in bed;with call bell/phone within reach   Time: 1544-1600 OT Time Calculation (min): 16 min Charges:  OT General Charges $OT Visit: 1  Procedure OT Evaluation $Initial OT Evaluation Tier I: 1 Procedure G-Codes:    Malka So 08/23/2015, 4:28 PM  [phone]

## 2015-08-24 MED ORDER — HYDROCODONE-ACETAMINOPHEN 5-325 MG PO TABS: 1.0000 | ORAL_TABLET | Freq: Four times a day (QID) | ORAL | Status: DC | PRN

## 2015-08-24 NOTE — Progress Notes (Signed)
Pt for discharge home today. Discharge orders received. IV and dcd with dressing clean dry and intact to lower back. Discharge instructions and prescriptions (1) given with verbalized understanding. Family at bedside to assist with discharge. Staff brought patient to lobby via wheelchair at 1155. Transported to home by family member.

## 2015-08-24 NOTE — Care Management Note (Signed)
Case Management Note  Patient Details  Name: ELGIE MAZIARZ MRN: [id number] Date of Birth: August 13, 1946  Subjective/Objective:                  Lumbar stenosis with disc herniation  Action/Plan:  Discharge planning Expected Discharge Date:  08/24/15               Expected Discharge Plan:  Home/Self Care  In-House Referral:     Discharge planning Services  CM Consult  Post Acute Care Choice:  NA Choice offered to:  NA  DME Arranged:  Hospital bed DME Agency:  [place]:  NA [place] Agency:     Status of Service:  Completed, signed off  Medicare Important Message Given:    Date Medicare IM Given:    Medicare IM give by:    Date Additional Medicare IM Given:    Additional Medicare Important Message give by:     If discussed at [place] of Stay Meetings, dates discussed:    Additional Comments: CM received call to please arrange for hospital bed for pt.  Cm called [facility] rep, Stephanie with request.  Cm notes no HH services recc or orders. No other CM needs were communicated. Dellie Catholic, RN 08/24/2015, 11:21 AM

## 2015-08-24 NOTE — Progress Notes (Signed)
Nursing Note: Pt Bp 95/51 P-72. Pt awake and fully alert and assypmtomatic.Pt received Iv toradol 15 mg after midnight and was up to the bathroom.Emptied hemovac  for 75 cc of bloody drainage.Fluids encouraged.Pt likes water,cup of water given.wbb

## 2015-08-24 NOTE — Discharge Summary (Signed)
Physician Discharge Summary  Patient ID: Cameron [name] MRN: [id number] DOB/AGE: 1946-02-17 69 y.o.  Admit date: 08/22/2015 Discharge date: 08/24/2015  Admission Diagnoses: Lumbar stenosis with disc herniation   Discharge Diagnoses: Same   Discharged Condition: good  Hospital Course: The patient was admitted on 08/22/2015 and taken to the operating room where the patient underwent lumbar laminectomy and discectomy. The patient tolerated the procedure well and was taken to the recovery room and then to the floor in stable condition. The hospital course was routine. There were no complications. The wound remained clean dry and intact. Pt had appropriate back soreness. No complaints of leg pain or new N/T/W. The patient remained afebrile with stable vital signs, and tolerated a regular diet. The patient continued to increase activities, and pain was well controlled with oral pain medications.   Consults: None  Significant Diagnostic Studies:  Results for orders placed or performed during the hospital encounter of 08/22/15  Surgical pcr screen  Result Value Ref Range   MRSA, PCR NEGATIVE NEGATIVE   Staphylococcus aureus NEGATIVE NEGATIVE  Basic metabolic panel  Result Value Ref Range   Sodium 137 135 - 145 mmol/L   Potassium 4.0 3.5 - 5.1 mmol/L   Chloride 101 101 - 111 mmol/L   CO2 28 22 - 32 mmol/L   Glucose, Bld 86 65 - 99 mg/dL   BUN 10 6 - 20 mg/dL   Creatinine, Ser 0.78 0.61 - 1.24 mg/dL   Calcium 9.0 8.9 - 10.3 mg/dL   GFR calc non Af Amer >60 >60 mL/min   GFR calc Af Amer >60 >60 mL/min   Anion gap 8 5 - 15  CBC  Result Value Ref Range   WBC 4.4 4.0 - 10.5 K/uL   RBC 4.94 4.22 - 5.81 MIL/uL   Hemoglobin 14.4 13.0 - 17.0 g/dL   HCT 42.9 39.0 - 52.0 %   MCV 86.8 78.0 - 100.0 fL   MCH 29.1 26.0 - 34.0 pg   MCHC 33.6 30.0 - 36.0 g/dL   RDW 13.9 11.5 - 15.5 %   Platelets 235 150 - 400 K/uL    Dg Lumbar Spine 2-3 Views  08/22/2015  CLINICAL DATA:  L2-3 and L3-4  microdiscectomy for disc herniations. Intraoperative localization. EXAM: LUMBAR SPINE - 2-3 VIEW COMPARISON:  MRI lumbar spine 08/14/2015, 06/19/2015. Lumbar spine x-rays 05/10/2015. FINDINGS: Portable lateral images were obtained intraoperatively and submitted for interpretation postoperatively. Initial image obtained at 1623 hr demonstrates the localizer needle superficial to the L3 spinous process. 2nd image obtained at 1635 hr demonstrates a localizer probe between the spinous processes of L3 and L4, directed toward the L3-4 disc. IMPRESSION: L3-4 localized intraoperatively. Electronically Signed   By: Evangeline Dakin M.D.   On: 08/22/2015 16:52   Mr Lumbar Spine Wo Contrast  08/18/2015  CLINICAL DATA:  L3-4 advanced spinal canal and moderate foraminal stenoses. L4-5 EXAM: MRI LUMBAR SPINE WITHOUT CONTRAST TECHNIQUE: Multiplanar, multisequence MR imaging of the lumbar spine was performed. No intravenous contrast was administered. COMPARISON:  06/19/2015 FINDINGS: No marrow signal abnormality suggestive of fracture, infection, or neoplasm. Normal conus signal and morphology. No perispinal abnormality to explain back pain. Degenerative changes with straight back and remote L4-5 laminectomy: T12- L1: Unremarkable. L1-L2: Disc narrowing with endplate ridging. Mild facet and ligament overgrowth. Disc bulging, greatest to the right foramen. Mild bilateral foraminal narrowing without impingement. Patent canal. L2-L3: Degenerative disc narrowing with circumferential bulging. Mild facet and ligament overgrowth. Right paracentral disc extrusion with mild  inferior migration. Spinal canal stenosis is advanced with minimal residual subarachnoid space, especially on the right where there is cauda equina compression. Patent foramina. L3-L4: Disc narrowing with circumferential bulging. Advanced facet arthropathy with small joint effusions and bulky ligamentum flavum thickening. Triangular deformation of the spinal canal  with minimal residual CSF and redundant nerve roots inferiorly on sagittal T2. Mild to moderate bilateral foraminal narrowing. L4-L5: Circumferential disc bulging, greatest to the right foraminal region with bulky endplate spurs right far-lateral. Facet arthropathy with overgrowth. Patent spinal canal with remote laminectomy. Subarticular recess narrowing without definite impingement. The right L4 nerve root is displaced by spurs in the far-lateral space, without definite compression. L5-S1:Degenerative disc disease with bulky endplate spurs and disc narrowing greater on the left. Facet arthropathy with left greater than right overgrowth. Advanced left foraminal stenosis, mainly from disc narrowing and endplate spurs, with L5 compression. Right foraminal stenosis is moderate. Patent spinal canal after laminectomy. IMPRESSION: 1. Advanced multilevel degenerative disc and facet disease without change since 06/19/2015. 2. L2-3 advanced spinal canal stenosis, especially on the right where there is inferiorly migrating disc extrusion. 3. L3-4 advanced spinal canal stenosis. 4. L5-S1 left foraminal stenosis with L5 compression. 5. Stenoses without definite neural compression noted above. Electronically Signed   By: Monte Fantasia M.D.   On: 08/18/2015 10:56    Antibiotics:  Anti-infectives    Start     Dose/Rate Route Frequency Ordered Stop   08/23/15 0600  ceFAZolin (ANCEF) IVPB 2 g/50 mL premix     2 g 100 mL/hr over 30 Minutes Intravenous On call to O.R. 08/22/15 1336 08/22/15 1616   08/23/15 0000  ceFAZolin (ANCEF) IVPB 1 g/50 mL premix     1 g 100 mL/hr over 30 Minutes Intravenous Every 8 hours 08/22/15 2115 08/23/15 0701   08/22/15 1338  ceFAZolin (ANCEF) 2-3 GM-% IVPB SOLR    Comments:  Sammuel Cooper   : cabinet override      08/22/15 1338 08/23/15 0144      Discharge Exam: Blood pressure 126/59, pulse 85, temperature 98 F (36.7 C), temperature source Oral, resp. rate 18, height 6' (1.829  m), weight 299 lb (135.626 kg), SpO2 97 %. Neurologic: Grossly normal Incision clean dry and intact  Discharge Medications:     Medication List    TAKE these medications        aspirin 81 MG tablet  Take 81 mg by mouth daily.     azelastine 0.1 % nasal spray  Commonly known as:  ASTELIN  Place 2 sprays into both nostrils at bedtime as needed for rhinitis. Use in each nostril as directed     folic acid 1 MG tablet  Commonly known as:  FOLVITE  TAKE 1 TABLET BY MOUTH EVERY DAY     HYDROcodone-acetaminophen 5-325 MG tablet  Commonly known as:  NORCO/VICODIN  Take 1-2 tablets by mouth every 6 (six) hours as needed (mild pain).     ibuprofen 200 MG tablet  Commonly known as:  ADVIL,MOTRIN  Take 800 mg by mouth every 6 (six) hours as needed (pain).     lansoprazole 30 MG capsule  Commonly known as:  PREVACID  Take 30 mg by mouth every morning.     latanoprost 0.005 % ophthalmic solution  Commonly known as:  XALATAN  Place 1 drop into both eyes at bedtime.     metoprolol succinate 25 MG 24 hr tablet  Commonly known as:  TOPROL-XL  Take 0.5 tablets (12.5 mg total)  by mouth daily.     MYRBETRIQ 50 MG Tb24 tablet  Generic drug:  mirabegron ER  Take 50 mg by mouth daily.     timolol 0.5 % ophthalmic solution  Commonly known as:  BETIMOL  Place 1 drop into both eyes daily.     vitamin C 1000 MG tablet  Take 1,000 mg by mouth 2 (two) times daily.        Disposition: Home   Final Dx: Lumbar laminectomy and discectomy      Discharge Instructions    Call MD for:  difficulty breathing, headache or visual disturbances    Complete by:  As directed      Call MD for:  persistant nausea and vomiting    Complete by:  As directed      Call MD for:  redness, tenderness, or signs of infection (pain, swelling, redness, odor or green/yellow discharge around incision site)    Complete by:  As directed      Call MD for:  severe uncontrolled pain    Complete by:  As directed       Call MD for:  temperature >100.4    Complete by:  As directed      Diet - low sodium heart healthy    Complete by:  As directed      Discharge instructions    Complete by:  As directed   May shower, no heavy lifting, no bending or twisting, no driving     Increase activity slowly    Complete by:  As directed      Remove dressing in 24 hours    Complete by:  As directed            Follow-up Information    Follow up with CABBELL,KYLE L, MD. Schedule an appointment as soon as possible for a visit in 2 weeks.   Specialty:  Neurosurgery   Contact information:   [address] [place] [address] [place] [address] [phone]        Signed: Eustace Moore 08/24/2015, 10:25 AM

## 2015-08-25 ENCOUNTER — Encounter (HOSPITAL_COMMUNITY): Payer: Self-pay | Admitting: Neurosurgery

## 2015-10-27 ENCOUNTER — Other Ambulatory Visit: Payer: Self-pay | Admitting: Internal Medicine

## 2015-11-09 HISTORY — PX: APPENDECTOMY: SHX54

## 2015-12-25 ENCOUNTER — Encounter: Payer: Self-pay | Admitting: Internal Medicine

## 2015-12-25 ENCOUNTER — Ambulatory Visit (INDEPENDENT_AMBULATORY_CARE_PROVIDER_SITE_OTHER): Payer: BLUE CROSS/BLUE SHIELD | Admitting: Internal Medicine

## 2015-12-25 VITALS — BP 126/72 | HR 77 | Temp 98.2°F | Ht 72.0 in | Wt 322.5 lb

## 2015-12-25 DIAGNOSIS — Z09 Encounter for follow-up examination after completed treatment for conditions other than malignant neoplasm: Secondary | ICD-10-CM

## 2015-12-25 DIAGNOSIS — J209 Acute bronchitis, unspecified: Secondary | ICD-10-CM | POA: Diagnosis not present

## 2015-12-25 MED ORDER — HYDROCODONE-HOMATROPINE 5-1.5 MG/5ML PO SYRP: 5.0000 mL | ORAL_SOLUTION | Freq: Every evening | ORAL | Status: DC | PRN

## 2015-12-25 MED ORDER — AZELASTINE HCL 0.1 % NA SOLN: 2.0000 | Freq: Every evening | NASAL | Status: DC | PRN

## 2015-12-25 MED ORDER — AZITHROMYCIN 250 MG PO TABS: ORAL_TABLET | ORAL | Status: DC

## 2015-12-25 MED ORDER — ALBUTEROL SULFATE HFA 108 (90 BASE) MCG/ACT IN AERS: 2.0000 | INHALATION_SPRAY | Freq: Four times a day (QID) | RESPIRATORY_TRACT | Status: DC | PRN

## 2015-12-25 NOTE — Progress Notes (Signed)
Subjective:    Patient ID: Cameron [name], male    DOB: December 18, 1945, 70 y.o.   MRN: [id number]  DOS:  12/25/2015 Type of visit - description : Acute Interval history:  Symptoms started a week ago with cough, cloudy sputum production, chest congestion, wheezing. Mucinex is helping with the cough to some extent. 3 days ago developed  nasal discharge.  Review of Systems Denies fever chills No sinus pain No chest pain or difficulty breathing No nausea, vomiting, myalgias. + Mild headache  Past Medical History  Diagnosis Date  . HTN (hypertension)   . Diverticulosis of colon   . Osteopenia     DEXA 12-2006    . Personal history of colonic polyps 2002    TUBULAR ADENOMA  . Iron deficiency anemia     h/o  . Fatty liver   . Prostatitis, acute 04-2011    admitted to [facility]  . GERD (gastroesophageal reflux disease)     takes Prevacid  . Glaucoma (increased eye pressure)   . Keloid of skin   . Sleep apnea     on CPAP, last study [facility]  . Arthritis     degenerative joint disease, knees   . Lumbar spondylosis     possible right sided radiculopathy with hx of surgery by Dr. Cyndy Freeze in 2003  . Constipation due to pain medication     Past Surgical History  Procedure Laterality Date  . Cholecystectomy  2003  . Back surgery  2003  . Colonoscopy    . Total knee arthroplasty Right 06/11/2014    Procedure: TOTAL KNEE ARTHROPLASTY;  Surgeon: Hessie Dibble, MD;  Location: [place];  Service: Orthopedics;  Laterality: Right;  . Total knee arthroplasty Left 10/08/2014    DR DALLDORF  . Total knee arthroplasty Left 10/08/2014    Procedure: TOTAL LEFT KNEE ARTHROPLASTY;  Surgeon: Hessie Dibble, MD;  Location: [place];  Service: Orthopedics;  Laterality: Left;  . Lumbar laminectomy/decompression microdiscectomy N/A 08/22/2015    Procedure: Lumbar two to lumbar three lumbar three to lumbar four microdiskectomy;  Surgeon: Ashok Pall, MD;  Location: [place] [facility];  Service: Neurosurgery;   Laterality: N/A;  L23 L34 microdiskectomy    Social History   Social History  . Marital Status: Married    Spouse Name: N/A  . Number of Children: 2  . Years of Education: N/A   Occupational History  . works in [place]  . Smoking status: Never Smoker   . Smokeless tobacco: Never Used  . Alcohol Use: Yes     Comment: socially  . Drug Use: No  . Sexual Activity: Not on file   Other Topics Concern  . Not on file   Social History Narrative    plays tennis sometimes         Medication List       This list is accurate as of: 12/25/15  4:44 PM.  Always use your most recent med list.               albuterol 108 (90 Base) MCG/ACT inhaler  Commonly known as:  VENTOLIN HFA  Inhale 2 puffs into the lungs every 6 (six) hours as needed for wheezing or shortness of breath.     aspirin 81 MG tablet  Take 81 mg by mouth daily.     azelastine 0.1 % nasal spray  Commonly known as:  ASTELIN  Place 2 sprays into both nostrils  at bedtime as needed for rhinitis. Use in each nostril as directed     azithromycin 250 MG tablet  Commonly known as:  ZITHROMAX Z-PAK  2 tabs a day the first day, then 1 tab a day x 4 days     folic acid 1 MG tablet  Commonly known as:  FOLVITE  Take 1 tablet (1 mg total) by mouth daily.     HYDROcodone-acetaminophen 5-325 MG tablet  Commonly known as:  NORCO/VICODIN  Take 1-2 tablets by mouth every 6 (six) hours as needed (mild pain).     HYDROcodone-homatropine 5-1.5 MG/5ML syrup  Commonly known as:  HYCODAN  Take 5 mLs by mouth at bedtime as needed for cough.     lansoprazole 30 MG capsule  Commonly known as:  PREVACID  Take 30 mg by mouth every morning.     latanoprost 0.005 % ophthalmic solution  Commonly known as:  XALATAN  Place 1 drop into both eyes at bedtime.     metoprolol succinate 25 MG 24 hr tablet  Commonly known as:  TOPROL-XL  Take 0.5 tablets (12.5 mg total) by mouth daily.     MYRBETRIQ 50 MG  Tb24 tablet  Generic drug:  mirabegron ER  Take 50 mg by mouth daily.     timolol 0.5 % ophthalmic solution  Commonly known as:  BETIMOL  Place 1 drop into both eyes daily.     vitamin C 1000 MG tablet  Take 1,000 mg by mouth 2 (two) times daily.           Objective:   Physical Exam BP 126/72 mmHg  Pulse 77  Temp(Src) 98.2 F (36.8 C) (Oral)  Ht 6' (1.829 m)  Wt 322 lb 8 oz (146.285 kg)  BMI 43.73 kg/m2  SpO2 98% General:   Well developed, well nourished . NAD.  HEENT:  Normocephalic . Face symmetric, atraumatic. TMs normal, nose quite congested, sinuses no TTP. Throat: Not red. Lungs:  Few rhonchi, + end expiratory wheezing Normal respiratory effort, no intercostal retractions, no accessory muscle use. Heart: RRR,  no murmur.  No pretibial edema bilaterally  Skin: Not pale. Not jaundice Neurologic:  alert & oriented X3.  Speech normal, gait appropriate for age and unassisted Psych--  Cognition and judgment appear intact.  Cooperative with normal attention span and concentration.  Behavior appropriate. No anxious or depressed appearing.      Assessment & Plan:  Assessment Hypertension OSA on CPAP GERD B12 and vit D def Fatty liver RAD (occ wheezing w/ URIs, inhalers) DJD --lumbar spondylosis, back surgery 2003, knee replacements GU: --LUTS  --h/o prostatitis admitted 2012 [facility] Glaucoma Osteopenia per DEXA 2008 h/o iron deficiency   PLAN:  Bronchitis, mild bronchospasm: Will treat with antibiotics, albuterol (he has a history of reactive airway disease, nose how to using kilos). See instructions Nearly due for a six-month checkup, recommend to make an appointment

## 2015-12-25 NOTE — Patient Instructions (Signed)
Rest, fluids , tylenol  For cough:  Take Mucinex DM twice a day as needed until better Hydrocodone at night if cough severe, watch for drowsiness  For wheezing: albuterol as needed   For nasal congestion: Use ASTELIN a prescribed spray : 2 nasal sprays on each side of the nose at night until you feel better   Avoid decongestants such as  Pseudoephedrine or phenylephrine     Take the antibiotic as prescribed  (zithromax)  Call if not gradually better over the next  10 days  Call anytime if the symptoms are severe

## 2015-12-25 NOTE — Progress Notes (Signed)
Pre visit review using our clinic review tool, if applicable. No additional management support is needed unless otherwise documented below in the visit note. 

## 2015-12-25 NOTE — Assessment & Plan Note (Signed)
Bronchitis, mild bronchospasm: Will treat with antibiotics, albuterol (he has a history of reactive airway disease, nose how to using kilos). See instructions Nearly due for a six-month checkup, recommend to make an appointment

## 2015-12-30 ENCOUNTER — Encounter: Payer: Self-pay | Admitting: Internal Medicine

## 2015-12-30 ENCOUNTER — Ambulatory Visit (INDEPENDENT_AMBULATORY_CARE_PROVIDER_SITE_OTHER): Payer: BLUE CROSS/BLUE SHIELD | Admitting: Internal Medicine

## 2015-12-30 VITALS — BP 122/78 | HR 62 | Temp 98.3°F | Ht 72.0 in | Wt 322.2 lb

## 2015-12-30 DIAGNOSIS — T148 Other injury of unspecified body region: Secondary | ICD-10-CM

## 2015-12-30 DIAGNOSIS — W57XXXA Bitten or stung by nonvenomous insect and other nonvenomous arthropods, initial encounter: Secondary | ICD-10-CM | POA: Diagnosis not present

## 2015-12-30 DIAGNOSIS — J209 Acute bronchitis, unspecified: Secondary | ICD-10-CM

## 2015-12-30 DIAGNOSIS — Z09 Encounter for follow-up examination after completed treatment for conditions other than malignant neoplasm: Secondary | ICD-10-CM

## 2015-12-30 MED ORDER — PREDNISONE 10 MG PO TABS: 10.0000 mg | ORAL_TABLET | Freq: Every day | ORAL | Status: DC

## 2015-12-30 MED ORDER — DOXYCYCLINE HYCLATE 100 MG PO TABS
100.0000 mg | ORAL_TABLET | Freq: Two times a day (BID) | ORAL | Status: DC
Start: 2015-12-30 — End: 2016-01-22

## 2015-12-30 NOTE — Progress Notes (Signed)
Pre visit review using our clinic review tool, if applicable. No additional management support is needed unless otherwise documented below in the visit note. 

## 2015-12-30 NOTE — Progress Notes (Signed)
Subjective:    Patient ID: Cameron [name], male    DOB: Apr 20, 1946, 70 y.o.   MRN: [id number]  DOS:  12/30/2015 Type of visit - description : Acute visit Interval history: About 4 weeks ago, he was in an outing  outside  [facility], and the next day noted a tick attached to the right arm, went to his job's nurse who removed it, shortly after developed a 1 inch ringlike redness at the site of the bite. It self resolved a week later, patient is somewhat concerned.  Also, was seen with bronchitis and bronchospasm, status post Z-Pak,  better but not completely well  Review of Systems No fever chills Mild decrease in energy but no unusual aches or pains. Continue with cough and some sputum production Sinus congestion has decreased   Past Medical History  Diagnosis Date  . HTN (hypertension)   . Diverticulosis of colon   . Osteopenia     DEXA 12-2006    . Personal history of colonic polyps 2002    TUBULAR ADENOMA  . Iron deficiency anemia     h/o  . Fatty liver   . Prostatitis, acute 04-2011    admitted to [facility]  . GERD (gastroesophageal reflux disease)     takes Prevacid  . Glaucoma (increased eye pressure)   . Keloid of skin   . Sleep apnea     on CPAP, last study [facility]  . Arthritis     degenerative joint disease, knees   . Lumbar spondylosis     possible right sided radiculopathy with hx of surgery by Dr. Cyndy Freeze in 2003  . Constipation due to pain medication     Past Surgical History  Procedure Laterality Date  . Cholecystectomy  2003  . Back surgery  2003  . Colonoscopy    . Total knee arthroplasty Right 06/11/2014    Procedure: TOTAL KNEE ARTHROPLASTY;  Surgeon: Hessie Dibble, MD;  Location: [place];  Service: Orthopedics;  Laterality: Right;  . Total knee arthroplasty Left 10/08/2014    DR DALLDORF  . Total knee arthroplasty Left 10/08/2014    Procedure: TOTAL LEFT KNEE ARTHROPLASTY;  Surgeon: Hessie Dibble, MD;  Location: [place];  Service:  Orthopedics;  Laterality: Left;  . Lumbar laminectomy/decompression microdiscectomy N/A 08/22/2015    Procedure: Lumbar two to lumbar three lumbar three to lumbar four microdiskectomy;  Surgeon: Ashok Pall, MD;  Location: [place] [facility];  Service: Neurosurgery;  Laterality: N/A;  L23 L34 microdiskectomy    Social History   Social History  . Marital Status: Married    Spouse Name: N/A  . Number of Children: 2  . Years of Education: N/A   Occupational History  . works in [place]  . Smoking status: Never Smoker   . Smokeless tobacco: Never Used  . Alcohol Use: Yes     Comment: socially  . Drug Use: No  . Sexual Activity: Not on file   Other Topics Concern  . Not on file   Social History Narrative    plays tennis sometimes         Medication List       This list is accurate as of: 12/30/15 11:59 PM.  Always use your most recent med list.               albuterol 108 (90 Base) MCG/ACT inhaler  Commonly known as:  VENTOLIN HFA  Inhale 2  puffs into the lungs every 6 (six) hours as needed for wheezing or shortness of breath.     aspirin 81 MG tablet  Take 81 mg by mouth daily.     azelastine 0.1 % nasal spray  Commonly known as:  ASTELIN  Place 2 sprays into both nostrils at bedtime as needed for rhinitis. Use in each nostril as directed     doxycycline 100 MG tablet  Commonly known as:  VIBRA-TABS  Take 1 tablet (100 mg total) by mouth 2 (two) times daily.     folic acid 1 MG tablet  Commonly known as:  FOLVITE  Take 1 tablet (1 mg total) by mouth daily.     HYDROcodone-acetaminophen 5-325 MG tablet  Commonly known as:  NORCO/VICODIN  Take 1-2 tablets by mouth every 6 (six) hours as needed (mild pain).     HYDROcodone-homatropine 5-1.5 MG/5ML syrup  Commonly known as:  HYCODAN  Take 5 mLs by mouth at bedtime as needed for cough.     lansoprazole 30 MG capsule  Commonly known as:  PREVACID  Take 30 mg by mouth every morning.      latanoprost 0.005 % ophthalmic solution  Commonly known as:  XALATAN  Place 1 drop into both eyes at bedtime.     metoprolol succinate 25 MG 24 hr tablet  Commonly known as:  TOPROL-XL  Take 0.5 tablets (12.5 mg total) by mouth daily.     MYRBETRIQ 50 MG Tb24 tablet  Generic drug:  mirabegron ER  Take 50 mg by mouth daily.     predniSONE 10 MG tablet  Commonly known as:  DELTASONE  Take 1 tablet (10 mg total) by mouth daily. 2 tabs a day x 5 days     timolol 0.5 % ophthalmic solution  Commonly known as:  BETIMOL  Place 1 drop into both eyes daily.     vitamin C 1000 MG tablet  Take 1,000 mg by mouth 2 (two) times daily.           Objective:   Physical Exam  Skin:      BP 122/78 mmHg  Pulse 62  Temp(Src) 98.3 F (36.8 C) (Oral)  Ht 6' (1.829 m)  Wt 322 lb 3.2 oz (146.149 kg)  BMI 43.69 kg/m2  SpO2 92% General:   Well developed, well nourished . NAD.  HEENT:  Normocephalic . Face symmetric, atraumatic. TMs: Right normal, left normal (after removal of abundant wax from the ear canal) Lungs:  Bilateral, few wheezes, no rhonchi. Normal respiratory effort, no intercostal retractions, no accessory muscle use. Heart: RRR,  no murmur.  No pretibial edema bilaterally  Neurologic:  alert & oriented X3.  Speech normal, gait appropriate for age and unassisted Psych--  Cognition and judgment appear intact.  Cooperative with normal attention span and concentration.  Behavior appropriate. No anxious or depressed appearing.      Assessment & Plan:   Assessment Hypertension OSA on CPAP GERD B12 and vit D def Fatty liver RAD (occ wheezing w/ URIs, inhalers) DJD --lumbar spondylosis, back surgery 2003, knee replacements GU: --LUTS  --h/o prostatitis admitted 2012 [facility] Glaucoma Osteopenia per DEXA 2008 h/o iron deficiency   PLAN:  Bronchitis, mild bronchospasm: Slightly better, continue with some wheezing. Recommend continue albuterol, prednisone, call if not  better. Tick bite: The patient reports a ring like rash after the tick bite, exam today is normal, recommend doxycycline for 10 days.

## 2015-12-30 NOTE — Patient Instructions (Signed)
  For cough:  Take Mucinex DM twice a day as needed until better  Take the antibiotic as prescribed  (doxycycline)  Take the prednisone x 5 days   Call if not gradually better over the next  10 days  Call anytime if the symptoms are severe  Call if a rash, headache, joint  aches

## 2015-12-31 NOTE — Assessment & Plan Note (Signed)
Bronchitis, mild bronchospasm: Slightly better, continue with some wheezing. Recommend continue albuterol, prednisone, call if not better. Tick bite: The patient reports a ring like rash after the tick bite, exam today is normal, recommend doxycycline for 10 days.

## 2016-01-16 ENCOUNTER — Observation Stay (HOSPITAL_COMMUNITY): Payer: BLUE CROSS/BLUE SHIELD | Admitting: Certified Registered Nurse Anesthetist

## 2016-01-16 ENCOUNTER — Emergency Department (HOSPITAL_BASED_OUTPATIENT_CLINIC_OR_DEPARTMENT_OTHER): Payer: BLUE CROSS/BLUE SHIELD

## 2016-01-16 ENCOUNTER — Inpatient Hospital Stay (HOSPITAL_BASED_OUTPATIENT_CLINIC_OR_DEPARTMENT_OTHER)
Admission: EM | Admit: 2016-01-16 | Discharge: 2016-01-22 | DRG: 339 | Disposition: A | Discharge status: Home / Self Care | Payer: BLUE CROSS/BLUE SHIELD | Attending: Surgery | Admitting: Surgery

## 2016-01-16 ENCOUNTER — Encounter (HOSPITAL_COMMUNITY): Admission: EM | Disposition: A | Discharge status: Home / Self Care | Payer: Self-pay | Source: Home / Self Care

## 2016-01-16 ENCOUNTER — Encounter (HOSPITAL_BASED_OUTPATIENT_CLINIC_OR_DEPARTMENT_OTHER): Payer: Self-pay

## 2016-01-16 DIAGNOSIS — G4733 Obstructive sleep apnea (adult) (pediatric): Secondary | ICD-10-CM | POA: Diagnosis present

## 2016-01-16 DIAGNOSIS — I10 Essential (primary) hypertension: Secondary | ICD-10-CM | POA: Diagnosis present

## 2016-01-16 DIAGNOSIS — K3589 Other acute appendicitis without perforation or gangrene: Secondary | ICD-10-CM

## 2016-01-16 DIAGNOSIS — R109 Unspecified abdominal pain: Secondary | ICD-10-CM | POA: Diagnosis not present

## 2016-01-16 DIAGNOSIS — Z6841 Body Mass Index (BMI) 40.0 and over, adult: Secondary | ICD-10-CM | POA: Diagnosis not present

## 2016-01-16 DIAGNOSIS — Z96651 Presence of right artificial knee joint: Secondary | ICD-10-CM | POA: Diagnosis present

## 2016-01-16 DIAGNOSIS — Z96652 Presence of left artificial knee joint: Secondary | ICD-10-CM | POA: Diagnosis present

## 2016-01-16 DIAGNOSIS — K5909 Other constipation: Secondary | ICD-10-CM | POA: Diagnosis present

## 2016-01-16 DIAGNOSIS — K3533 Acute appendicitis with perforation and localized peritonitis, with abscess: Secondary | ICD-10-CM | POA: Diagnosis present

## 2016-01-16 DIAGNOSIS — K353 Acute appendicitis with localized peritonitis: Secondary | ICD-10-CM | POA: Diagnosis present

## 2016-01-16 DIAGNOSIS — K219 Gastro-esophageal reflux disease without esophagitis: Secondary | ICD-10-CM | POA: Diagnosis present

## 2016-01-16 DIAGNOSIS — K3580 Unspecified acute appendicitis: Secondary | ICD-10-CM | POA: Diagnosis present

## 2016-01-16 HISTORY — DX: Bitten or stung by nonvenomous insect and other nonvenomous arthropods, initial encounter: W57.XXXA

## 2016-01-16 HISTORY — PX: LAPAROSCOPIC APPENDECTOMY: SHX408

## 2016-01-16 LAB — COMPREHENSIVE METABOLIC PANEL
ALT: 21 U/L (ref 17–63)
AST: 22 U/L (ref 15–41)
Albumin: 3.8 g/dL (ref 3.5–5.0)
Alkaline Phosphatase: 94 U/L (ref 38–126)
Anion gap: 6 (ref 5–15)
BUN: 11 mg/dL (ref 6–20)
CO2: 28 mmol/L (ref 22–32)
Calcium: 8.7 mg/dL — ABNORMAL LOW (ref 8.9–10.3)
Chloride: 105 mmol/L (ref 101–111)
Creatinine, Ser: 0.82 mg/dL (ref 0.61–1.24)
GFR calc Af Amer: >60 mL/min (ref >60)
GFR calc non Af Amer: >60 mL/min (ref >60)
Glucose, Bld: 127 mg/dL — ABNORMAL HIGH (ref 65–99)
Potassium: 3.6 mmol/L (ref 3.5–5.1)
Sodium: 139 mmol/L (ref 135–145)
Total Bilirubin: 1 mg/dL (ref 0.3–1.2)
Total Protein: 6.8 g/dL (ref 6.5–8.1)

## 2016-01-16 LAB — URINALYSIS, ROUTINE W REFLEX MICROSCOPIC
Bilirubin Urine: NEGATIVE
Glucose, UA: NEGATIVE mg/dL
Hgb urine dipstick: NEGATIVE
Ketones, ur: NEGATIVE mg/dL
Leukocytes, UA: NEGATIVE
Nitrite: NEGATIVE
Protein, ur: 30 mg/dL — AB
Specific Gravity, Urine: 1.026 (ref 1.005–1.030)
pH: 8.5 — ABNORMAL HIGH (ref 5.0–8.0)

## 2016-01-16 LAB — CBC
HCT: 38.4 % — ABNORMAL LOW (ref 39.0–52.0)
Hemoglobin: 12 g/dL — ABNORMAL LOW (ref 13.0–17.0)
MCH: 26.3 pg (ref 26.0–34.0)
MCHC: 31.3 g/dL (ref 30.0–36.0)
MCV: 84 fL (ref 78.0–100.0)
Platelets: 230 10*3/uL (ref 150–400)
RBC: 4.57 MIL/uL (ref 4.22–5.81)
RDW: 15.4 % (ref 11.5–15.5)
WBC: 12.9 10*3/uL — ABNORMAL HIGH (ref 4.0–10.5)

## 2016-01-16 LAB — CBC WITH DIFFERENTIAL/PLATELET
Basophils Absolute: 0 10*3/uL (ref 0.0–0.1)
Basophils Relative: 0 %
Eosinophils Absolute: 0.1 10*3/uL (ref 0.0–0.7)
Eosinophils Relative: 1 %
HCT: 36 % — ABNORMAL LOW (ref 39.0–52.0)
Hemoglobin: 11.8 g/dL — ABNORMAL LOW (ref 13.0–17.0)
Lymphocytes Relative: 11 %
Lymphs Abs: 0.8 10*3/uL (ref 0.7–4.0)
MCH: 27.1 pg (ref 26.0–34.0)
MCHC: 32.8 g/dL (ref 30.0–36.0)
MCV: 82.6 fL (ref 78.0–100.0)
Monocytes Absolute: 0.8 10*3/uL (ref 0.1–1.0)
Monocytes Relative: 11 %
Neutro Abs: 5.4 10*3/uL (ref 1.7–7.7)
Neutrophils Relative %: 77 %
Platelets: 240 10*3/uL (ref 150–400)
RBC: 4.36 MIL/uL (ref 4.22–5.81)
RDW: 15.3 % (ref 11.5–15.5)
WBC: 7 10*3/uL (ref 4.0–10.5)

## 2016-01-16 LAB — LIPASE, BLOOD: Lipase: 27 U/L (ref 11–51)

## 2016-01-16 LAB — URINE MICROSCOPIC-ADD ON

## 2016-01-16 LAB — CREATININE, SERUM
Creatinine, Ser: 1.01 mg/dL (ref 0.61–1.24)
GFR calc Af Amer: >60 mL/min (ref >60)
GFR calc non Af Amer: >60 mL/min (ref >60)

## 2016-01-16 SURGERY — APPENDECTOMY, LAPAROSCOPIC
Anesthesia: General

## 2016-01-16 MED ORDER — BISACODYL 5 MG PO TBEC
5.0000 mg | DELAYED_RELEASE_TABLET | Freq: Every day | ORAL | Status: DC | PRN
  Administered 2016-01-20: 5 mg via ORAL
  Filled 2016-01-16: qty 1

## 2016-01-16 MED ORDER — MAGNESIUM CITRATE PO SOLN
1.0000 | Freq: Once | ORAL | Status: AC
  Administered 2016-01-16: 1 via ORAL
  Filled 2016-01-16: qty 296

## 2016-01-16 MED ORDER — TIMOLOL MALEATE 0.5 % OP SOLN
1.0000 [drp] | Freq: Every day | OPHTHALMIC | Status: DC
  Administered 2016-01-16 – 2016-01-21 (×6): 1 [drp] via OPHTHALMIC
  Filled 2016-01-16 (×2): qty 5

## 2016-01-16 MED ORDER — ROCURONIUM BROMIDE 50 MG/5ML IV SOLN
INTRAVENOUS | Status: AC
  Filled 2016-01-16: qty 1

## 2016-01-16 MED ORDER — PROPOFOL 10 MG/ML IV BOLUS
INTRAVENOUS | Status: DC | PRN
  Administered 2016-01-16: 20 mg via INTRAVENOUS
  Administered 2016-01-16: 140 mg via INTRAVENOUS

## 2016-01-16 MED ORDER — METRONIDAZOLE IN NACL 5-0.79 MG/ML-% IV SOLN
500.0000 mg | Freq: Once | INTRAVENOUS | Status: AC
  Administered 2016-01-16: 500 mg via INTRAVENOUS
  Filled 2016-01-16: qty 100

## 2016-01-16 MED ORDER — SUGAMMADEX SODIUM 500 MG/5ML IV SOLN
INTRAVENOUS | Status: DC | PRN
  Administered 2016-01-16: 299.4 mg via INTRAVENOUS

## 2016-01-16 MED ORDER — HYDROMORPHONE HCL 1 MG/ML IJ SOLN
1.0000 mg | Freq: Once | INTRAMUSCULAR | Status: AC
  Administered 2016-01-16: 1 mg via INTRAVENOUS

## 2016-01-16 MED ORDER — IOHEXOL 300 MG/ML  SOLN
50.0000 mL | Freq: Once | INTRAMUSCULAR | Status: AC | PRN
  Administered 2016-01-16: 50 mL via ORAL

## 2016-01-16 MED ORDER — HYDROMORPHONE HCL 1 MG/ML IJ SOLN
1.0000 mg | Freq: Once | INTRAMUSCULAR | Status: DC
  Filled 2016-01-16: qty 1

## 2016-01-16 MED ORDER — METOCLOPRAMIDE HCL 5 MG/ML IJ SOLN
10.0000 mg | Freq: Once | INTRAMUSCULAR | Status: AC
  Administered 2016-01-16: 10 mg via INTRAVENOUS
  Filled 2016-01-16: qty 2

## 2016-01-16 MED ORDER — METRONIDAZOLE IN NACL 5-0.79 MG/ML-% IV SOLN
500.0000 mg | Freq: Three times a day (TID) | INTRAVENOUS | Status: DC
  Administered 2016-01-16 – 2016-01-22 (×17): 500 mg via INTRAVENOUS
  Filled 2016-01-16 (×20): qty 100

## 2016-01-16 MED ORDER — PROPOFOL 10 MG/ML IV BOLUS
INTRAVENOUS | Status: AC
  Filled 2016-01-16: qty 20

## 2016-01-16 MED ORDER — SODIUM CHLORIDE 0.9 % IV SOLN
INTRAVENOUS | Status: DC
  Administered 2016-01-16: 10:00:00 via INTRAVENOUS

## 2016-01-16 MED ORDER — LATANOPROST 0.005 % OP SOLN
1.0000 [drp] | Freq: Every day | OPHTHALMIC | Status: DC
  Administered 2016-01-16 – 2016-01-21 (×6): 1 [drp] via OPHTHALMIC
  Filled 2016-01-16: qty 2.5

## 2016-01-16 MED ORDER — AZELASTINE HCL 0.1 % NA SOLN: 2.0000 | Freq: Every evening | NASAL | Status: DC | PRN

## 2016-01-16 MED ORDER — ENOXAPARIN SODIUM 40 MG/0.4ML ~~LOC~~ SOLN: 40.0000 mg | SUBCUTANEOUS | Status: DC

## 2016-01-16 MED ORDER — HYDROMORPHONE HCL 1 MG/ML IJ SOLN
1.0000 mg | INTRAMUSCULAR | Status: DC | PRN
  Administered 2016-01-16: 1 mg via INTRAVENOUS
  Filled 2016-01-16: qty 1

## 2016-01-16 MED ORDER — SUCCINYLCHOLINE CHLORIDE 20 MG/ML IJ SOLN
INTRAMUSCULAR | Status: DC | PRN
  Administered 2016-01-16: 120 mg via INTRAVENOUS

## 2016-01-16 MED ORDER — MIDAZOLAM HCL 2 MG/2ML IJ SOLN
INTRAMUSCULAR | Status: AC
  Filled 2016-01-16: qty 2

## 2016-01-16 MED ORDER — DEXAMETHASONE SODIUM PHOSPHATE 4 MG/ML IJ SOLN
INTRAMUSCULAR | Status: AC
  Filled 2016-01-16: qty 2

## 2016-01-16 MED ORDER — FENTANYL CITRATE (PF) 100 MCG/2ML IJ SOLN
INTRAMUSCULAR | Status: DC | PRN
  Administered 2016-01-16: 100 ug via INTRAVENOUS
  Administered 2016-01-16: 50 ug via INTRAVENOUS
  Administered 2016-01-16: 100 ug via INTRAVENOUS

## 2016-01-16 MED ORDER — HYDROMORPHONE HCL 1 MG/ML IJ SOLN
1.0000 mg | Freq: Once | INTRAMUSCULAR | Status: AC
  Administered 2016-01-16: 1 mg via INTRAVENOUS
  Filled 2016-01-16: qty 1

## 2016-01-16 MED ORDER — ADULT MULTIVITAMIN W/MINERALS CH
1.0000 | ORAL_TABLET | Freq: Every day | ORAL | Status: DC
  Administered 2016-01-17 – 2016-01-21 (×3): 1 via ORAL
  Filled 2016-01-16 (×4): qty 1

## 2016-01-16 MED ORDER — BUPIVACAINE-EPINEPHRINE 0.25% -1:200000 IJ SOLN
INTRAMUSCULAR | Status: DC | PRN
  Administered 2016-01-16: 9 mL

## 2016-01-16 MED ORDER — OXYCODONE HCL 5 MG/5ML PO SOLN: 5.0000 mg | Freq: Once | ORAL | Status: DC | PRN

## 2016-01-16 MED ORDER — MIDAZOLAM HCL 5 MG/5ML IJ SOLN
INTRAMUSCULAR | Status: DC | PRN
  Administered 2016-01-16: 2 mg via INTRAVENOUS

## 2016-01-16 MED ORDER — ALBUTEROL SULFATE HFA 108 (90 BASE) MCG/ACT IN AERS: 2.0000 | INHALATION_SPRAY | Freq: Four times a day (QID) | RESPIRATORY_TRACT | Status: DC | PRN

## 2016-01-16 MED ORDER — HYDROMORPHONE HCL 1 MG/ML IJ SOLN
1.0000 mg | INTRAMUSCULAR | Status: DC | PRN
  Administered 2016-01-17 – 2016-01-22 (×25): 1 mg via INTRAVENOUS
  Filled 2016-01-16 (×26): qty 1

## 2016-01-16 MED ORDER — ROCURONIUM BROMIDE 100 MG/10ML IV SOLN
INTRAVENOUS | Status: DC | PRN
  Administered 2016-01-16: 10 mg via INTRAVENOUS
  Administered 2016-01-16: 40 mg via INTRAVENOUS
  Administered 2016-01-16: 20 mg via INTRAVENOUS

## 2016-01-16 MED ORDER — ONDANSETRON HCL 4 MG/2ML IJ SOLN
4.0000 mg | Freq: Four times a day (QID) | INTRAMUSCULAR | Status: DC | PRN
  Administered 2016-01-17: 4 mg via INTRAVENOUS
  Filled 2016-01-16 (×2): qty 2

## 2016-01-16 MED ORDER — DEXAMETHASONE SODIUM PHOSPHATE 4 MG/ML IJ SOLN
INTRAMUSCULAR | Status: DC | PRN
  Administered 2016-01-16: 8 mg via INTRAVENOUS

## 2016-01-16 MED ORDER — CEFTRIAXONE SODIUM 2 G IJ SOLR
2.0000 g | Freq: Once | INTRAMUSCULAR | Status: AC
Start: 2016-01-16 — End: 2016-01-16
  Administered 2016-01-16: 2 g via INTRAVENOUS
  Filled 2016-01-16: qty 2

## 2016-01-16 MED ORDER — METRONIDAZOLE IN NACL 5-0.79 MG/ML-% IV SOLN: 500.0000 mg | Freq: Four times a day (QID) | INTRAVENOUS | Status: DC

## 2016-01-16 MED ORDER — SODIUM CHLORIDE 0.9 % IR SOLN
Status: DC | PRN
  Administered 2016-01-16: 1000 mL

## 2016-01-16 MED ORDER — SUGAMMADEX SODIUM 500 MG/5ML IV SOLN
INTRAVENOUS | Status: AC
  Filled 2016-01-16: qty 5

## 2016-01-16 MED ORDER — 0.9 % SODIUM CHLORIDE (POUR BTL) OPTIME
TOPICAL | Status: DC | PRN
  Administered 2016-01-16: 1000 mL

## 2016-01-16 MED ORDER — ONDANSETRON 4 MG PO TBDP: 4.0000 mg | ORAL_TABLET | Freq: Four times a day (QID) | ORAL | Status: DC | PRN

## 2016-01-16 MED ORDER — HYDROMORPHONE HCL 1 MG/ML IJ SOLN
1.0000 mg | INTRAMUSCULAR | Status: AC | PRN
  Administered 2016-01-16: 1 mg via INTRAVENOUS

## 2016-01-16 MED ORDER — MIRABEGRON ER 25 MG PO TB24
50.0000 mg | ORAL_TABLET | Freq: Every day | ORAL | Status: DC
  Administered 2016-01-17 – 2016-01-21 (×3): 50 mg via ORAL
  Filled 2016-01-16 (×6): qty 1

## 2016-01-16 MED ORDER — OXYCODONE-ACETAMINOPHEN 5-325 MG PO TABS: 1.0000 | ORAL_TABLET | ORAL | Status: DC | PRN

## 2016-01-16 MED ORDER — VITAMIN C 500 MG PO TABS
1000.0000 mg | ORAL_TABLET | Freq: Two times a day (BID) | ORAL | Status: DC
  Administered 2016-01-16 – 2016-01-21 (×7): 1000 mg via ORAL
  Filled 2016-01-16 (×10): qty 2

## 2016-01-16 MED ORDER — METRONIDAZOLE IN NACL 5-0.79 MG/ML-% IV SOLN
500.0000 mg | Freq: Four times a day (QID) | INTRAVENOUS | Status: DC
  Administered 2016-01-16: 500 mg via INTRAVENOUS
  Filled 2016-01-16 (×4): qty 100

## 2016-01-16 MED ORDER — ONDANSETRON HCL 4 MG/2ML IJ SOLN: 4.0000 mg | Freq: Four times a day (QID) | INTRAMUSCULAR | Status: DC | PRN

## 2016-01-16 MED ORDER — OXYCODONE HCL 5 MG PO TABS: 5.0000 mg | ORAL_TABLET | Freq: Once | ORAL | Status: DC | PRN

## 2016-01-16 MED ORDER — LACTATED RINGERS IV SOLN
INTRAVENOUS | Status: DC
  Administered 2016-01-16: 14:00:00 via INTRAVENOUS
  Administered 2016-01-17: 1 via INTRAVENOUS

## 2016-01-16 MED ORDER — SODIUM CHLORIDE 0.9 % IV BOLUS (SEPSIS)
1000.0000 mL | Freq: Once | INTRAVENOUS | Status: AC
  Administered 2016-01-16: 1000 mL via INTRAVENOUS

## 2016-01-16 MED ORDER — MORPHINE SULFATE (PF) 4 MG/ML IV SOLN
6.0000 mg | Freq: Once | INTRAVENOUS | Status: AC
  Administered 2016-01-16: 6 mg via INTRAVENOUS
  Filled 2016-01-16: qty 2

## 2016-01-16 MED ORDER — ENOXAPARIN SODIUM 40 MG/0.4ML ~~LOC~~ SOLN
40.0000 mg | SUBCUTANEOUS | Status: DC
  Administered 2016-01-17 – 2016-01-21 (×5): 40 mg via SUBCUTANEOUS
  Filled 2016-01-16 (×5): qty 0.4

## 2016-01-16 MED ORDER — PHENYLEPHRINE 40 MCG/ML (10ML) SYRINGE FOR IV PUSH (FOR BLOOD PRESSURE SUPPORT)
PREFILLED_SYRINGE | INTRAVENOUS | Status: AC
  Filled 2016-01-16: qty 10

## 2016-01-16 MED ORDER — SENNOSIDES-DOCUSATE SODIUM 8.6-50 MG PO TABS
1.0000 | ORAL_TABLET | Freq: Every evening | ORAL | Status: DC | PRN
Start: 2016-01-16 — End: 2016-01-22

## 2016-01-16 MED ORDER — LACTATED RINGERS IV SOLN
INTRAVENOUS | Status: DC
  Administered 2016-01-16 – 2016-01-18 (×3): via INTRAVENOUS
  Filled 2016-01-16 (×7): qty 1000

## 2016-01-16 MED ORDER — LIDOCAINE HCL (CARDIAC) 20 MG/ML IV SOLN
INTRAVENOUS | Status: AC
  Filled 2016-01-16: qty 5

## 2016-01-16 MED ORDER — METOPROLOL SUCCINATE ER 25 MG PO TB24
12.5000 mg | ORAL_TABLET | Freq: Every day | ORAL | Status: DC
  Administered 2016-01-16 – 2016-01-21 (×5): 12.5 mg via ORAL
  Filled 2016-01-16 (×6): qty 1

## 2016-01-16 MED ORDER — PANTOPRAZOLE SODIUM 40 MG PO TBEC
40.0000 mg | DELAYED_RELEASE_TABLET | Freq: Every day | ORAL | Status: DC
  Administered 2016-01-17 – 2016-01-21 (×4): 40 mg via ORAL
  Filled 2016-01-16 (×5): qty 1

## 2016-01-16 MED ORDER — ASPIRIN 81 MG PO CHEW
81.0000 mg | CHEWABLE_TABLET | Freq: Every day | ORAL | Status: DC
  Administered 2016-01-17 – 2016-01-21 (×4): 81 mg via ORAL
  Filled 2016-01-16 (×5): qty 1

## 2016-01-16 MED ORDER — ONDANSETRON HCL 4 MG/2ML IJ SOLN
4.0000 mg | Freq: Once | INTRAMUSCULAR | Status: AC
  Administered 2016-01-16: 4 mg via INTRAVENOUS
  Filled 2016-01-16: qty 2

## 2016-01-16 MED ORDER — FOLIC ACID 1 MG PO TABS
1.0000 mg | ORAL_TABLET | Freq: Every day | ORAL | Status: DC
  Administered 2016-01-17 – 2016-01-21 (×3): 1 mg via ORAL
  Filled 2016-01-16 (×4): qty 1

## 2016-01-16 MED ORDER — FENTANYL CITRATE (PF) 250 MCG/5ML IJ SOLN
INTRAMUSCULAR | Status: AC
  Filled 2016-01-16: qty 5

## 2016-01-16 MED ORDER — DEXTROSE 5 % IV SOLN
2.0000 g | Freq: Three times a day (TID) | INTRAVENOUS | Status: DC
  Administered 2016-01-16 – 2016-01-22 (×17): 2 g via INTRAVENOUS
  Filled 2016-01-16 (×20): qty 2

## 2016-01-16 MED ORDER — ONDANSETRON HCL 4 MG/2ML IJ SOLN
INTRAMUSCULAR | Status: DC | PRN
  Administered 2016-01-16: 4 mg via INTRAVENOUS

## 2016-01-16 MED ORDER — BUPIVACAINE-EPINEPHRINE (PF) 0.25% -1:200000 IJ SOLN
INTRAMUSCULAR | Status: AC
  Filled 2016-01-16: qty 30

## 2016-01-16 MED ORDER — ASPIRIN 81 MG PO CHEW
CHEWABLE_TABLET | ORAL | Status: AC
  Filled 2016-01-16: qty 1

## 2016-01-16 MED ORDER — HYDROMORPHONE HCL 1 MG/ML IJ SOLN: 0.2500 mg | INTRAMUSCULAR | Status: DC | PRN

## 2016-01-16 MED ORDER — SODIUM CHLORIDE 0.9 % IV SOLN
Freq: Once | INTRAVENOUS | Status: AC
  Administered 2016-01-16: 11:00:00 via INTRAVENOUS

## 2016-01-16 MED ORDER — LIDOCAINE HCL (CARDIAC) 20 MG/ML IV SOLN
INTRAVENOUS | Status: DC | PRN
  Administered 2016-01-16: 80 mg via INTRAVENOUS

## 2016-01-16 MED ORDER — ONDANSETRON HCL 4 MG/2ML IJ SOLN
INTRAMUSCULAR | Status: AC
  Filled 2016-01-16: qty 2

## 2016-01-16 MED ORDER — DEXTROSE 5 % IV SOLN: 2.0000 g | INTRAVENOUS | Status: DC

## 2016-01-16 MED ORDER — IOHEXOL 300 MG/ML  SOLN
100.0000 mL | Freq: Once | INTRAMUSCULAR | Status: AC | PRN
  Administered 2016-01-16: 100 mL via INTRAVENOUS

## 2016-01-16 SURGICAL SUPPLY — 38 items
ADH SKN CLS APL DERMABOND .7 (GAUZE/BANDAGES/DRESSINGS) ×1
APPLIER CLIP ROT 10 11.4 M/L (STAPLE)
APR CLP MED LRG 11.4X10 (STAPLE)
BAG SPEC RTRVL LRG 6X4 10 (ENDOMECHANICALS) ×1
BLADE SURG ROTATE 9660 (MISCELLANEOUS) ×1 IMPLANT
CANISTER SUCTION 2500CC (MISCELLANEOUS) ×2 IMPLANT
CLIP APPLIE ROT 10 11.4 M/L (STAPLE) IMPLANT
COVER SURGICAL LIGHT HANDLE (MISCELLANEOUS) ×2 IMPLANT
CUTTER FLEX LINEAR 45M (STAPLE) ×2 IMPLANT
DERMABOND ADVANCED (GAUZE/BANDAGES/DRESSINGS) ×1
DERMABOND ADVANCED .7 DNX12 (GAUZE/BANDAGES/DRESSINGS) ×1 IMPLANT
DRAIN CHANNEL 19F RND (DRAIN) ×1 IMPLANT
ELECT REM PT RETURN 9FT ADLT (ELECTROSURGICAL) ×2
ELECTRODE REM PT RTRN 9FT ADLT (ELECTROSURGICAL) ×1 IMPLANT
EVACUATOR SILICONE 100CC (DRAIN) ×1 IMPLANT
GLOVE EUDERMIC 7 POWDERFREE (GLOVE) ×2 IMPLANT
GOWN STRL REUS W/ TWL LRG LVL3 (GOWN DISPOSABLE) ×2 IMPLANT
GOWN STRL REUS W/ TWL XL LVL3 (GOWN DISPOSABLE) ×1 IMPLANT
GOWN STRL REUS W/TWL LRG LVL3 (GOWN DISPOSABLE) ×4
GOWN STRL REUS W/TWL XL LVL3 (GOWN DISPOSABLE) ×2
KIT BASIN OR (CUSTOM PROCEDURE TRAY) ×2 IMPLANT
KIT ROOM TURNOVER OR (KITS) ×2 IMPLANT
NS IRRIG 1000ML POUR BTL (IV SOLUTION) ×2 IMPLANT
PAD ARMBOARD 7.5X6 YLW CONV (MISCELLANEOUS) ×3 IMPLANT
POUCH SPECIMEN RETRIEVAL 10MM (ENDOMECHANICALS) ×2 IMPLANT
RELOAD STAPLE 45 3.5 BLU ETS (ENDOMECHANICALS) ×1 IMPLANT
RELOAD STAPLE TA45 3.5 REG BLU (ENDOMECHANICALS) ×2 IMPLANT
SCALPEL HARMONIC ACE (MISCELLANEOUS) ×2 IMPLANT
SET IRRIG TUBING LAPAROSCOPIC (IRRIGATION / IRRIGATOR) ×2 IMPLANT
SPECIMEN JAR SMALL (MISCELLANEOUS) ×2 IMPLANT
SUT ETHILON 2 0 FS 18 (SUTURE) ×1 IMPLANT
SUT MNCRL AB 4-0 PS2 18 (SUTURE) ×2 IMPLANT
TOWEL OR 17X26 10 PK STRL BLUE (TOWEL DISPOSABLE) ×2 IMPLANT
TRAY LAPAROSCOPIC MC (CUSTOM PROCEDURE TRAY) ×2 IMPLANT
TROCAR XCEL 12X100 BLDLESS (ENDOMECHANICALS) ×2 IMPLANT
TROCAR XCEL BLUNT TIP 100MML (ENDOMECHANICALS) ×2 IMPLANT
TROCAR XCEL NON-BLD 5MMX100MML (ENDOMECHANICALS) ×2 IMPLANT
TUBING INSUFFLATION (TUBING) ×2 IMPLANT

## 2016-01-16 NOTE — ED Notes (Signed)
Pt has had constipation for the last two days, finally had a bowel movement at 1430 today and since then has had right side abdominal pain with nausea and urge of having to have a bowel movement but unable to go.

## 2016-01-16 NOTE — ED Notes (Signed)
MD at bedside. 

## 2016-01-16 NOTE — H&P (Signed)
Chief Complaint: abdominal pain HPI: Cameron [name] is a 70 year old male with a history of HTN, GERD, diverticulosis, OSA, chronic constipation, cholecystectomy and obesity who presents from [facility] with acute appendicitis.  The reports having constipation since Monday.  He has a bowel movement yesterday afternoon and then developed periumbilical abdominal pain associated with nausea around 2:30PM.  He continued to have symptoms.  Modifying factors include; baking soda, tums without any help.  Denies fever, chills or sweats.  Denies vomiting.  Time pattern is constant.  No aggravating factors.  Last oral intake was at midnight last night.   MCHP work up shows a normal white count with stable vital signs.  Ct of abdomen and pelvis significant periappendiceal inflammatory stranding consistent with acute appendicitis.  We have therefore been asked to evaluate after transfer to [facility].   Past Medical History  Diagnosis Date  . HTN (hypertension)   . Diverticulosis of colon   . Osteopenia     DEXA 12-2006    . Personal history of colonic polyps 2002    TUBULAR ADENOMA  . Iron deficiency anemia     h/o  . Fatty liver   . Prostatitis, acute 04-2011    admitted to [facility]  . GERD (gastroesophageal reflux disease)     takes Prevacid  . Glaucoma (increased eye pressure)   . Keloid of skin   . Sleep apnea     on CPAP, last study [facility]  . Arthritis     degenerative joint disease, knees   . Lumbar spondylosis     possible right sided radiculopathy with hx of surgery by Dr. Cyndy Freeze in 2003  . Constipation due to pain medication   . Tick bite     Past Surgical History  Procedure Laterality Date  . Cholecystectomy  2003  . Back surgery  2003  . Colonoscopy    . Total knee arthroplasty Right 06/11/2014    Procedure: TOTAL KNEE ARTHROPLASTY;  Surgeon: Hessie Dibble, MD;  Location: [place];  Service: Orthopedics;  Laterality: Right;  . Total knee arthroplasty Left 10/08/2014    DR DALLDORF  . Total knee  arthroplasty Left 10/08/2014    Procedure: TOTAL LEFT KNEE ARTHROPLASTY;  Surgeon: Hessie Dibble, MD;  Location: [place];  Service: Orthopedics;  Laterality: Left;  . Lumbar laminectomy/decompression microdiscectomy N/A 08/22/2015    Procedure: Lumbar two to lumbar three lumbar three to lumbar four microdiskectomy;  Surgeon: Ashok Pall, MD;  Location: [place] [facility];  Service: Neurosurgery;  Laterality: N/A;  L23 L34 microdiskectomy    Family History  Problem Relation Age of Onset  . Colon cancer Maternal Uncle     dx age 82s  . Colon cancer Other     grandfather, dx age 72  . Prostate cancer Neg Hx   . Heart attack Neg Hx   . Hypertension Neg Hx   . Diabetes Neg Hx   . Stroke Other     maternal aunt in her 72s  . Dementia Mother   . Diverticulitis Father    Social History:  reports that he has never smoked. He has never used smokeless tobacco. He reports that he drinks alcohol. He reports that he does not use illicit drugs.  Allergies:  Allergies  Allergen Reactions  . Chlorhexidine Gluconate Hives and Other (See Comments)    Red rash developed on upper chest   . Ciprofloxacin Hcl Itching  . Clindamycin Itching and Rash     (Not in a  hospital admission)  Results for orders placed or performed during the hospital encounter of 01/16/16 (from the past 48 hour(s))  CBC with Differential/Platelet     Status: Abnormal   Collection Time: 01/16/16  1:55 AM  Result Value Ref Range   WBC 7.0 4.0 - 10.5 K/uL   RBC 4.36 4.22 - 5.81 MIL/uL   Hemoglobin 11.8 (L) 13.0 - 17.0 g/dL   HCT 36.0 (L) 39.0 - 52.0 %   MCV 82.6 78.0 - 100.0 fL   MCH 27.1 26.0 - 34.0 pg   MCHC 32.8 30.0 - 36.0 g/dL   RDW 15.3 11.5 - 15.5 %   Platelets 240 150 - 400 K/uL   Neutrophils Relative % 77 %   Neutro Abs 5.4 1.7 - 7.7 K/uL   Lymphocytes Relative 11 %   Lymphs Abs 0.8 0.7 - 4.0 K/uL   Monocytes Relative 11 %   Monocytes Absolute 0.8 0.1 - 1.0 K/uL   Eosinophils Relative 1 %   Eosinophils  Absolute 0.1 0.0 - 0.7 K/uL   Basophils Relative 0 %   Basophils Absolute 0.0 0.0 - 0.1 K/uL  Comprehensive metabolic panel     Status: Abnormal   Collection Time: 01/16/16  1:55 AM  Result Value Ref Range   Sodium 139 135 - 145 mmol/L   Potassium 3.6 3.5 - 5.1 mmol/L   Chloride 105 101 - 111 mmol/L   CO2 28 22 - 32 mmol/L   Glucose, Bld 127 (H) 65 - 99 mg/dL   BUN 11 6 - 20 mg/dL   Creatinine, Ser 0.82 0.61 - 1.24 mg/dL   Calcium 8.7 (L) 8.9 - 10.3 mg/dL   Total Protein 6.8 6.5 - 8.1 g/dL   Albumin 3.8 3.5 - 5.0 g/dL   AST 22 15 - 41 U/L   ALT 21 17 - 63 U/L   Alkaline Phosphatase 94 38 - 126 U/L   Total Bilirubin 1.0 0.3 - 1.2 mg/dL   GFR calc non Af Amer >60 >60 mL/min   GFR calc Af Amer >60 >60 mL/min    Comment: (NOTE) The eGFR has been calculated using the CKD EPI equation. This calculation has not been validated in all clinical situations. eGFR's persistently <60 mL/min signify possible Chronic Kidney Disease.    Anion gap 6 5 - 15  Lipase, blood     Status: None   Collection Time: 01/16/16  1:55 AM  Result Value Ref Range   Lipase 27 11 - 51 U/L  Urinalysis, Routine w reflex microscopic (not at [facility])     Status: Abnormal   Collection Time: 01/16/16  2:50 AM  Result Value Ref Range   Color, Urine YELLOW YELLOW   APPearance CLEAR CLEAR   Specific Gravity, Urine 1.026 1.005 - 1.030   pH 8.5 (H) 5.0 - 8.0   Glucose, UA NEGATIVE NEGATIVE mg/dL   Hgb urine dipstick NEGATIVE NEGATIVE   Bilirubin Urine NEGATIVE NEGATIVE   Ketones, ur NEGATIVE NEGATIVE mg/dL   Protein, ur 30 (A) NEGATIVE mg/dL   Nitrite NEGATIVE NEGATIVE   Leukocytes, UA NEGATIVE NEGATIVE  Urine microscopic-add on     Status: Abnormal   Collection Time: 01/16/16  2:50 AM  Result Value Ref Range   Squamous Epithelial / LPF 0-5 (A) NONE SEEN   WBC, UA 0-5 0 - 5 WBC/hpf   RBC / HPF 0-5 0 - 5 RBC/hpf   Bacteria, UA RARE (A) NONE SEEN   Ct Abdomen Pelvis W Contrast  01/16/2016  CLINICAL DATA:   Initial evaluation for acute right-sided abdominal pain with nausea. EXAM: CT ABDOMEN AND PELVIS WITH CONTRAST TECHNIQUE: Multidetector CT imaging of the abdomen and pelvis was performed using the standard protocol following bolus administration of intravenous contrast. CONTRAST:  159m OMNIPAQUE IOHEXOL 300 MG/ML SOLN, 551mOMNIPAQUE IOHEXOL 300 MG/ML SOLN COMPARISON:  None available. FINDINGS: Mild scattered atelectatic changes noted within the visualized lung bases. The visualized lungs are otherwise clear. Liver within normal limits. Gallbladder absent. No biliary dilatation. Spleen, adrenal glands, and pancreas demonstrate a normal contrast enhanced appearance. Kidneys are equal in size with symmetric enhancement. Punctate 3 mm nonobstructive left renal calculus. No hydronephrosis. No focal enhancing renal mass. Mild gastric wall thickening present near the gastric antrum/ pylorus noted, most likely related to incomplete distension. Stomach otherwise unremarkable. No evidence for small bowel obstruction. There is extensive inflammatory changes about the dilated appendix in the right lower quadrant which measures up to 1.9 cm. No definite frank periappendiceal abscess. No free air to suggest perforation. B more distal aspect of the appendix is relatively normal in appearance without significant inflammatory changes. Colon is normal in appearance. Sigmoid diverticulosis without evidence for acute diverticulitis. Bladder decompressed but grossly normal. Prostate mildly enlarged measuring 5.5 cm in diameter. No free air. Small amount of free fluid along the right pericolic gutter related to the inflamed appendix. No pathologically enlarged intra-abdominal or pelvic lymph nodes. No acute osseous abnormality. Postoperative changes noted within the lumbar spine. Multilevel degenerative changes present. No worrisome lytic or blastic osseous lesions. IMPRESSION: 1. Findings consistent with acute appendicitis.  Significant periappendiceal inflammatory stranding without frank periappendiceal abscess or evidence for perforation. 2. Sigmoid diverticulosis without evidence for acute diverticulitis. 3. 3 mm nonobstructive left renal calculus. Electronically Signed   By: BeJeannine Boga.D.   On: 01/16/2016 06:57    Review of Systems  Constitutional: Negative for fever, chills, weight loss, malaise/fatigue and diaphoresis.  Respiratory: Negative for cough, hemoptysis, sputum production, shortness of breath and wheezing.   Cardiovascular: Negative for chest pain, palpitations, orthopnea, claudication, leg swelling and PND.  Gastrointestinal: Positive for nausea, abdominal pain and constipation. Negative for heartburn, vomiting, diarrhea, blood in stool and melena.  Genitourinary: Negative for dysuria, urgency, frequency, hematuria and flank pain.  Musculoskeletal: Negative for myalgias, back pain, joint pain, falls and neck pain.  Neurological: Negative for dizziness, tingling, tremors, sensory change, speech change, focal weakness, seizures, loss of consciousness and weakness.  Psychiatric/Behavioral: Negative for depression, suicidal ideas and substance abuse.    Blood pressure 137/83, pulse 85, temperature 99.5 F (37.5 C), temperature source Oral, resp. rate 18, height 6' (1.829 m), weight 149.687 kg (330 lb), SpO2 96 %. Physical Exam  Constitutional: He is oriented to person, place, and time. He appears well-developed and well-nourished. No distress.  HENT:  Head: Normocephalic and atraumatic.  Mouth/Throat: No oropharyngeal exudate.  Eyes: Conjunctivae are normal. Pupils are equal, round, and reactive to light. No scleral icterus.  Cardiovascular: Normal rate, regular rhythm, normal heart sounds and intact distal pulses.  Exam reveals no gallop and no friction rub.   No murmur heard. Respiratory: Effort normal and breath sounds normal. No respiratory distress. He has no wheezes. He has no  rales. He exhibits no tenderness.  GI: Soft. Bowel sounds are normal. He exhibits no distension and no mass. There is no guarding.  Focal tenderness to RLQ, mild   Musculoskeletal: Normal range of motion. He exhibits no edema or tenderness.  Neurological: He is alert and  oriented to person, place, and time.  Skin: Skin is warm and dry. No rash noted. He is not diaphoretic. No erythema. No pallor.  Psychiatric: He has a normal mood and affect. His behavior is normal. Judgment and thought content normal.     Assessment/Plan Acute appendicitis-to OR today for a laparoscopic appendectomy.  Surgical risks discussed including infection, bleeding, injury to surrounding structures, open surgery, heart attack, DVT/PE, stroke, respiratory compromise.  The patient verbalizes understanding and wishes to proceed. ID-received rocephin/flagyl at [place].   FEN-NPO, IVF, pain control HTN-home meds OSA-CPAP VTE prophylaxis-SCDs, post op lovenox   Erby Pian, NP  Pager [phone] 01/16/2016, 10:01 AM

## 2016-01-16 NOTE — ED Notes (Signed)
Patient is being transferred by carelink to [facility] ED

## 2016-01-16 NOTE — Anesthesia Preprocedure Evaluation (Addendum)
Anesthesia Evaluation  Patient identified by MRN, date of birth, ID band Patient awake    Reviewed: Allergy & Precautions, NPO status , Patient's Chart, lab work & pertinent test results, reviewed documented beta blocker date and time   Airway Mallampati: II  TM Distance: >3 FB Neck ROM: Full    Dental  (+) Teeth Intact   Pulmonary sleep apnea ,    breath sounds clear to auscultation       Cardiovascular hypertension, Pt. on medications and Pt. on home beta blockers  Rhythm:Regular Rate:Normal     Neuro/Psych  Neuromuscular disease    GI/Hepatic Neg liver ROS, GERD  Medicated,  Endo/Other  negative endocrine ROS  Renal/GU negative Renal ROS  negative genitourinary   Musculoskeletal  (+) Arthritis ,   Abdominal   Peds negative pediatric ROS (+)  Hematology   Anesthesia Other Findings   Reproductive/Obstetrics negative OB ROS                            Lab Results  Component Value Date   WBC 7.0 01/16/2016   HGB 11.8* 01/16/2016   HCT 36.0* 01/16/2016   MCV 82.6 01/16/2016   PLT 240 01/16/2016   Lab Results  Component Value Date   INR 1.16 09/30/2014   INR 1.16 06/04/2014   INR 1.2* 04/16/2011   08/2015 EKG: normal EKG, normal sinus rhythm.    Anesthesia Physical Anesthesia Plan  ASA: III  Anesthesia Plan: General   Post-op Pain Management:    Induction: Intravenous  Airway Management Planned: Oral ETT  Additional Equipment:   Intra-op Plan:   Post-operative Plan: Extubation in OR  Informed Consent: I have reviewed the patients History and Physical, chart, labs and discussed the procedure including the risks, benefits and alternatives for the proposed anesthesia with the patient or authorized representative who has indicated his/her understanding and acceptance.   Dental advisory given  Plan Discussed with: CRNA  Anesthesia Plan Comments:          Anesthesia Quick Evaluation

## 2016-01-16 NOTE — Interval H&P Note (Signed)
History and Physical Interval Note:  01/16/2016 2:59 PM  Cameron [name]  has presented today for surgery, with the diagnosis of acute appendicitis  The various methods of treatment have been discussed with the patient and family. After consideration of risks, benefits and other options for treatment, the patient has consented to  Procedure(s): APPENDECTOMY LAPAROSCOPIC (N/A) as a surgical intervention .  The patient's history has been reviewed, patient examined, no change in status, stable for surgery.  I have reviewed the patient's chart and labs.  Questions were answered to the patient's satisfaction.     Adin Hector

## 2016-01-16 NOTE — ED Provider Notes (Signed)
Pt transferred from [facility], seen by Dr. Claudine Mouton for abdominal pain. CT shows acute appendicitis. Abx given prior to transport. On arrival, pt is well appearing with reassuring VS. Gave IV dilaudid and contacted general surgery. Pt evaluated by Sherral Hammers and will be taken to OR for operative management.  Sharlett Iles, MD 01/16/16 [phone]

## 2016-01-16 NOTE — Transfer of Care (Signed)
Immediate Anesthesia Transfer of Care Note  Patient: Cameron [name]  Procedure(s) Performed: Procedure(s): APPENDECTOMY LAPAROSCOPIC (N/A)  Patient Location: PACU  Anesthesia Type:General  Level of Consciousness: awake  Airway & Oxygen Therapy: Patient Spontanous Breathing and Patient connected to face mask oxygen  Post-op Assessment: Report given to RN and Post -op Vital signs reviewed and stable  Post vital signs: Reviewed and stable  Last Vitals:  Filed Vitals:   01/16/16 1334 01/16/16 1422  BP:  162/84  Pulse:  73  Temp: 37.6 C   Resp:  18    Complications: No apparent anesthesia complications

## 2016-01-16 NOTE — ED Notes (Signed)
Patient transported to CT 

## 2016-01-16 NOTE — ED Notes (Signed)
Pt still has not had a bowel movement and pain is back.  Daughter is at bedside.

## 2016-01-16 NOTE — ED Notes (Signed)
Surgical NP at bedside

## 2016-01-16 NOTE — ED Provider Notes (Signed)
CSN: [id number]     Arrival date & time 01/16/16  0105 History   First MD Initiated Contact with Patient 01/16/16 0113     Chief Complaint  Patient presents with  . Abdominal Pain     (Consider location/radiation/quality/duration/timing/severity/associated sxs/prior Treatment) HPI  Cameron [name] is a 70 y.o. male with past medical history of constipation presenting today with abdominal pain. He states this is going on for the past 2 days. He describes it as periumbilical without any radiation. It is a sharp stabbing pain. He does not have a bowel movement in a couple of days but then had one today 230pm which did not help the pain.  He describes no nausea or vomiting. He states he has the urge to urinate and have a bowel movement but he cannot go. He has had no fevers or sick contacts. Nothing has made his symptoms better. There are no further complaints.    10 Systems reviewed and are negative for acute change except as noted in the HPI.     Past Medical History  Diagnosis Date  . HTN (hypertension)   . Diverticulosis of colon   . Osteopenia     DEXA 12-2006    . Personal history of colonic polyps 2002    TUBULAR ADENOMA  . Iron deficiency anemia     h/o  . Fatty liver   . Prostatitis, acute 04-2011    admitted to [facility]  . GERD (gastroesophageal reflux disease)     takes Prevacid  . Glaucoma (increased eye pressure)   . Keloid of skin   . Sleep apnea     on CPAP, last study [facility]  . Arthritis     degenerative joint disease, knees   . Lumbar spondylosis     possible right sided radiculopathy with hx of surgery by Dr. Cyndy Freeze in 2003  . Constipation due to pain medication   . Tick bite    Past Surgical History  Procedure Laterality Date  . Cholecystectomy  2003  . Back surgery  2003  . Colonoscopy    . Total knee arthroplasty Right 06/11/2014    Procedure: TOTAL KNEE ARTHROPLASTY;  Surgeon: Hessie Dibble, MD;  Location: [facility];  Service: Orthopedics;  Laterality:  Right;  . Total knee arthroplasty Left 10/08/2014    DR DALLDORF  . Total knee arthroplasty Left 10/08/2014    Procedure: TOTAL LEFT KNEE ARTHROPLASTY;  Surgeon: Hessie Dibble, MD;  Location: [facility];  Service: Orthopedics;  Laterality: Left;  . Lumbar laminectomy/decompression microdiscectomy N/A 08/22/2015    Procedure: Lumbar two to lumbar three lumbar three to lumbar four microdiskectomy;  Surgeon: Ashok Pall, MD;  Location: [facility];  Service: Neurosurgery;  Laterality: N/A;  L23 L34 microdiskectomy   Family History  Problem Relation Age of Onset  . Colon cancer Maternal Uncle     dx age 9s  . Colon cancer Other     grandfather, dx age 50  . Prostate cancer Neg Hx   . Heart attack Neg Hx   . Hypertension Neg Hx   . Diabetes Neg Hx   . Stroke Other     maternal aunt in her 47s  . Dementia Mother   . Diverticulitis Father    Social History  Substance Use Topics  . Smoking status: Never Smoker   . Smokeless tobacco: Never Used  . Alcohol Use: Yes     Comment: socially    Review of Systems  Allergies  Chlorhexidine gluconate; Ciprofloxacin hcl; and Clindamycin  Home Medications   Prior to Admission medications   Medication Sig Start Date End Date Taking? Authorizing Provider  albuterol (VENTOLIN HFA) 108 (90 Base) MCG/ACT inhaler Inhale 2 puffs into the lungs every 6 (six) hours as needed for wheezing or shortness of breath. 12/25/15   Colon Branch, MD  Ascorbic Acid (VITAMIN C) 1000 MG tablet Take 1,000 mg by mouth 2 (two) times daily.     Historical Provider, MD  aspirin 81 MG tablet Take 81 mg by mouth daily.    Historical Provider, MD  azelastine (ASTELIN) 0.1 % nasal spray Place 2 sprays into both nostrils at bedtime as needed for rhinitis. Use in each nostril as directed 12/25/15   Colon Branch, MD  doxycycline (VIBRA-TABS) 100 MG tablet Take 1 tablet (100 mg total) by mouth 2 (two) times daily. 12/30/15   Colon Branch, MD  folic acid (FOLVITE) 1 MG tablet Take 1  tablet (1 mg total) by mouth daily. 10/28/15   Colon Branch, MD  HYDROcodone-acetaminophen (NORCO/VICODIN) 5-325 MG tablet Take 1-2 tablets by mouth every 6 (six) hours as needed (mild pain). Patient not taking: Reported on 12/25/2015 08/24/15   Eustace Moore, MD  HYDROcodone-homatropine [facility]) 5-1.5 MG/5ML syrup Take 5 mLs by mouth at bedtime as needed for cough. 12/25/15   Colon Branch, MD  lansoprazole (PREVACID) 30 MG capsule Take 30 mg by mouth every morning.    Historical Provider, MD  latanoprost (XALATAN) 0.005 % ophthalmic solution Place 1 drop into both eyes at bedtime.  05/13/14   Historical Provider, MD  metoprolol succinate (TOPROL-XL) 25 MG 24 hr tablet Take 0.5 tablets (12.5 mg total) by mouth daily. Patient taking differently: Take 12.5 mg by mouth at bedtime.  08/19/15   Colon Branch, MD  MYRBETRIQ 50 MG TB24 tablet Take 50 mg by mouth daily.  04/15/14   Historical Provider, MD  predniSONE (DELTASONE) 10 MG tablet Take 1 tablet (10 mg total) by mouth daily. 2 tabs a day x 5 days 12/30/15   Colon Branch, MD  timolol (BETIMOL) 0.5 % ophthalmic solution Place 1 drop into both eyes daily.     Historical Provider, MD   BP 156/91 mmHg  Pulse 83  Temp(Src) 98.2 F (36.8 C) (Oral)  Resp 18  Ht 6' (1.829 m)  Wt 330 lb (149.687 kg)  BMI 44.75 kg/m2  SpO2 94% Physical Exam  Constitutional: He is oriented to person, place, and time. Vital signs are normal. He appears well-developed and well-nourished.  Non-toxic appearance. He does not appear ill. No distress.  HENT:  Head: Normocephalic and atraumatic.  Nose: Nose normal.  Mouth/Throat: Oropharynx is clear and moist. No oropharyngeal exudate.  Eyes: Conjunctivae and EOM are normal. Pupils are equal, round, and reactive to light. No scleral icterus.  Neck: Normal range of motion. Neck supple. No tracheal deviation, no edema, no erythema and normal range of motion present. No thyroid mass and no thyromegaly present.  Cardiovascular: Normal rate,  regular rhythm, S1 normal, S2 normal, normal heart sounds, intact distal pulses and normal pulses.  Exam reveals no gallop and no friction rub.   No murmur heard. Pulmonary/Chest: Effort normal and breath sounds normal. No respiratory distress. He has no wheezes. He has no rhonchi. He has no rales.  Abdominal: Soft. Normal appearance and bowel sounds are normal. He exhibits no distension, no ascites and no mass. There is no hepatosplenomegaly. There  is no tenderness. There is no rebound, no guarding and no CVA tenderness.  Musculoskeletal: Normal range of motion. He exhibits no edema or tenderness.  Lymphadenopathy:    He has no cervical adenopathy.  Neurological: He is alert and oriented to person, place, and time. He has normal strength. No cranial nerve deficit or sensory deficit.  Skin: Skin is warm, dry and intact. No petechiae and no rash noted. He is not diaphoretic. No erythema. No pallor.  Psychiatric: He has a normal mood and affect. His behavior is normal. Judgment normal.  Nursing note and vitals reviewed.   ED Course  Procedures (including critical care time) Labs Review Labs Reviewed  CBC WITH DIFFERENTIAL/PLATELET - Abnormal; Notable for the following:    Hemoglobin 11.8 (*)    HCT 36.0 (*)    All other components within normal limits  COMPREHENSIVE METABOLIC PANEL - Abnormal; Notable for the following:    Glucose, Bld 127 (*)    Calcium 8.7 (*)    All other components within normal limits  URINALYSIS, ROUTINE W REFLEX MICROSCOPIC (NOT AT [facility]) - Abnormal; Notable for the following:    pH 8.5 (*)    Protein, ur 30 (*)    All other components within normal limits  URINE MICROSCOPIC-ADD ON - Abnormal; Notable for the following:    Squamous Epithelial / LPF 0-5 (*)    Bacteria, UA RARE (*)    All other components within normal limits  LIPASE, BLOOD    Imaging Review Ct Abdomen Pelvis W Contrast  01/16/2016  CLINICAL DATA:  Initial evaluation for acute right-sided  abdominal pain with nausea. EXAM: CT ABDOMEN AND PELVIS WITH CONTRAST TECHNIQUE: Multidetector CT imaging of the abdomen and pelvis was performed using the standard protocol following bolus administration of intravenous contrast. CONTRAST:  114mL OMNIPAQUE IOHEXOL 300 MG/ML SOLN, 36mL OMNIPAQUE IOHEXOL 300 MG/ML SOLN COMPARISON:  None available. FINDINGS: Mild scattered atelectatic changes noted within the visualized lung bases. The visualized lungs are otherwise clear. Liver within normal limits. Gallbladder absent. No biliary dilatation. Spleen, adrenal glands, and pancreas demonstrate a normal contrast enhanced appearance. Kidneys are equal in size with symmetric enhancement. Punctate 3 mm nonobstructive left renal calculus. No hydronephrosis. No focal enhancing renal mass. Mild gastric wall thickening present near the gastric antrum/ pylorus noted, most likely related to incomplete distension. Stomach otherwise unremarkable. No evidence for small bowel obstruction. There is extensive inflammatory changes about the dilated appendix in the right lower quadrant which measures up to 1.9 cm. No definite frank periappendiceal abscess. No free air to suggest perforation. B more distal aspect of the appendix is relatively normal in appearance without significant inflammatory changes. Colon is normal in appearance. Sigmoid diverticulosis without evidence for acute diverticulitis. Bladder decompressed but grossly normal. Prostate mildly enlarged measuring 5.5 cm in diameter. No free air. Small amount of free fluid along the right pericolic gutter related to the inflamed appendix. No pathologically enlarged intra-abdominal or pelvic lymph nodes. No acute osseous abnormality. Postoperative changes noted within the lumbar spine. Multilevel degenerative changes present. No worrisome lytic or blastic osseous lesions. IMPRESSION: 1. Findings consistent with acute appendicitis. Significant periappendiceal inflammatory stranding  without frank periappendiceal abscess or evidence for perforation. 2. Sigmoid diverticulosis without evidence for acute diverticulitis. 3. 3 mm nonobstructive left renal calculus. Electronically Signed   By: Jeannine Boga M.D.   On: 01/16/2016 06:57   I have personally reviewed and evaluated these images and lab results as part of my medical decision-making.  EKG Interpretation None      MDM   Final diagnoses:  None    Patient presents to the ED for abdominal pain.  No tenderness on exam, I do no think imaging is warranted.  May be constipation, labs are unremarkable.  He was given morphine for pain control.  Ordered Mag citrate as well as he states it has worked in the past.  Also given IVF and zofran.  Will observe in the ED overnight.  He was given 2nd dose of morphine and reglan and he symptoms have now improved.  Still no BM with mag citrate.  Will obtain CT scan to evaluate any obstruction.    CT scan reveals appendicitis. Patient kept NPO, wil give abx and consult with surgery for transfer.   Everlene Balls, MD 01/16/16 [phone]

## 2016-01-16 NOTE — Anesthesia Procedure Notes (Signed)
Procedure Name: Intubation Date/Time: 01/16/2016 3:27 PM Performed by: Julian Reil Pre-anesthesia Checklist: Patient identified, Emergency Drugs available, Suction available and Patient being monitored Patient Re-evaluated:Patient Re-evaluated prior to inductionOxygen Delivery Method: Circle system utilized Preoxygenation: Pre-oxygenation with 100% oxygen Intubation Type: IV induction Ventilation: Mask ventilation without difficulty Laryngoscope Size: Mac and 3 Grade View: Grade II Tube type: Oral Tube size: 7.5 mm Number of attempts: 1 Airway Equipment and Method: Stylet Placement Confirmation: ETT inserted through vocal cords under direct vision,  positive ETCO2 and breath sounds checked- equal and bilateral Secured at: 23 cm Tube secured with: Tape Dental Injury: Teeth and Oropharynx as per pre-operative assessment

## 2016-01-16 NOTE — Op Note (Signed)
Patient Name:           Cameron [name]   Date of Surgery:        01/16/2016  Pre op Diagnosis:      Acute appendicitis  Post op Diagnosis:    Ruptured acute appendicitis with walled off abscess  Procedure:                 Laparoscopic appendectomy, drainage of abscess  Surgeon:                     Edsel Petrin. Dalbert Batman, M.D., FACS  Assistant:                      Or staff  Operative Indications:   This is a 70 year old Afro-American male with a history of hypertension, GERD, diverticulosis,, obstructive sleep apnea, cholecystectomy, and morbid obesity weighing 150 kg.  He reported a 4 day history of constipation but developed centralized abdominal pain about 24 hours prior to being seen at [facility].  Examination reveals a morbidly obese gentleman.  Well-healed scars from laparoscopic cholecystectomy.  Tenderness in the right or quadrant but minimal guarding and rebound.  CT scan showed area penis inflammatory stranding but no evidence of perforation or abscess.  He was transferred to [facility] is brought to the operating room urgently for appendectomy.  Operative Findings:  The appendix was large, acutely inflamed, and densely adherent to the right lateral pelvic sidewall.  There was no peritonitis but upon mobilizing the appendix off of the right lateral pelvic sidewall encountered an abscess which was drained.  The small bowel and right colon looked normal.  The anatomy was quite distorted and at first it was difficult to identify the appendix and its relationship to the cecum and ileocecal valve, and this was due to the intense inflammation obscuring the anatomical outlines of the structures.  The anatomy ultimately became clear, however.       Procedure in Detail:          Following the induction of general endotracheal anesthesia the patient's abdomen was prepped and draped in a sterile fashion.  Intravenous antibiotics had been given.  Surgical timeout was performed.  0.5%  Marcaine with epinephrine was used as local infiltration anesthetic     Vertical incision was made at the superior rim of the umbilicus.  The very deep subcutaneous tissues tissue was retracted and the fascia was incised in the midline and the abdominal cavity entered under direct vision.  An 11 mm Hassan trocar was inserted and secured the Purstring suture of 0 Vicryl.  Pneumoperitoneum was created and video camera inserted.     5 mm trocar was ultimately placed the left lower quadrant and 12 millimeter trocar placed in the suprapubic midline.  We position the patient and mobilized the small bowel up out of the pelvis and away from the right colon.  We mobilized the cecum which was densely adherent  to the right lateral pelvic sidewall.  With blunt suction dissection I ultimately found the base of the appendix superiorly and then was able  with blunt dissection to dissect out a very enlarged inflamed appendix extending inferiorly.  I made a window at the base of the appendix and placed a stapler across the base of the appendix and could see its junction with the cecum and transected it there with a stapling device.  The staple line looked very good.  Using the harmonic scalpel  and blunt dissection I slowly dissected the appendix inferiorly until I had it completely mobilized.  I encountered an abscess and could see the perforation and the appendix.  I evacuated all the purulent fluid and irrigated this out.  The appendix was placed in a specimen bag and removed after irrigating everything out. There d did not appear to be any further abscess.    There was no bleeding.  I felt that he was at increased risk for staple line leak and abscess and so I placed a drain across the area of dissection which is medial to the cecum and brought that out through the suprapubic port site and sutured to the skin.  The pneumoperitoneum was released.  The fascia at the umbilicus was closed with 0 Vicryl sutures.  The skin incisions  were closed with subcuticular sutures of 4-0 Monocryl and Steri-Strips.  Clean bandages were placed and the patient taken to PACU in stable condition.  EBL 30 mL.  Counts correct.  Complications none.     Edsel Petrin. Dalbert Batman, M.D., FACS General and Minimally Invasive Surgery Breast and Colorectal Surgery  01/16/2016 5:16 PM

## 2016-01-17 MED ORDER — DOCUSATE SODIUM 100 MG PO CAPS
100.0000 mg | ORAL_CAPSULE | Freq: Two times a day (BID) | ORAL | Status: DC
  Administered 2016-01-17 – 2016-01-21 (×8): 100 mg via ORAL
  Filled 2016-01-17 (×10): qty 1

## 2016-01-17 MED ORDER — POLYETHYLENE GLYCOL 3350 17 G PO PACK
17.0000 g | PACK | Freq: Every day | ORAL | Status: DC
  Administered 2016-01-17 – 2016-01-21 (×4): 17 g via ORAL
  Filled 2016-01-17 (×5): qty 1

## 2016-01-17 MED ORDER — ONDANSETRON 4 MG PO TBDP
4.0000 mg | ORAL_TABLET | Freq: Four times a day (QID) | ORAL | Status: DC | PRN
  Administered 2016-01-22: 4 mg via ORAL
  Filled 2016-01-17: qty 1

## 2016-01-17 MED ORDER — ONDANSETRON HCL 4 MG/2ML IJ SOLN
4.0000 mg | INTRAMUSCULAR | Status: DC | PRN
  Administered 2016-01-17 – 2016-01-21 (×19): 4 mg via INTRAVENOUS
  Filled 2016-01-17 (×19): qty 2

## 2016-01-17 MED ORDER — ONDANSETRON HCL 4 MG/2ML IJ SOLN
4.0000 mg | Freq: Once | INTRAMUSCULAR | Status: AC
  Administered 2016-01-17: 4 mg via INTRAVENOUS

## 2016-01-17 NOTE — Progress Notes (Signed)
1 Day Post-Op  Subjective: He c/o nausea.  Objective: Vital signs in last 24 hours: Temp:  [98.1 F (36.7 C)-99.7 F (37.6 C)] 98.9 F (37.2 C) (03/11 0730) Pulse Rate:  [68-95] 92 (03/11 0730) Resp:  [18-23] 18 (03/11 0730) BP: (134-162)/(63-99) 142/82 mmHg (03/11 0730) SpO2:  [92 %-100 %] 93 % (03/11 0730) Last BM Date: 01/15/16  Intake/Output from previous day: 03/10 0701 - 03/11 0700 In: 3277.9 [P.O.:830; I.V.:2147.9; IV Piggyback:300] Out: 560 [Urine:450; Drains:60; Blood:50] Intake/Output this shift:    PE: General- In NAD Abdomen-soft, quiet, dressing dry, thin serosanguinous drain output  Lab Results:   Recent Labs  01/16/16 0155 01/16/16 1952  WBC 7.0 12.9*  HGB 11.8* 12.0*  HCT 36.0* 38.4*  PLT 240 230   BMET  Recent Labs  01/16/16 0155 01/16/16 1952  NA 139  --   K 3.6  --   CL 105  --   CO2 28  --   GLUCOSE 127*  --   BUN 11  --   CREATININE 0.82 1.01  CALCIUM 8.7*  --    PT/INR No results for input(s): LABPROT, INR in the last 72 hours. Comprehensive Metabolic Panel:    Component Value Date/Time   NA 139 01/16/2016 0155   NA 137 08/22/2015 1353   K 3.6 01/16/2016 0155   K 4.0 08/22/2015 1353   CL 105 01/16/2016 0155   CL 101 08/22/2015 1353   CO2 28 01/16/2016 0155   CO2 28 08/22/2015 1353   BUN 11 01/16/2016 0155   BUN 10 08/22/2015 1353   CREATININE 1.01 01/16/2016 1952   CREATININE 0.82 01/16/2016 0155   GLUCOSE 127* 01/16/2016 0155   GLUCOSE 86 08/22/2015 1353   CALCIUM 8.7* 01/16/2016 0155   CALCIUM 9.0 08/22/2015 1353   AST 22 01/16/2016 0155   AST 17 07/28/2015 0839   ALT 21 01/16/2016 0155   ALT 18 07/28/2015 0839   ALKPHOS 94 01/16/2016 0155   ALKPHOS 98 07/23/2014 0842   BILITOT 1.0 01/16/2016 0155   BILITOT 0.7 07/23/2014 0842   PROT 6.8 01/16/2016 0155   PROT 6.9 07/23/2014 0842   ALBUMIN 3.8 01/16/2016 0155   ALBUMIN 3.8 07/23/2014 0842     Studies/Results: Ct Abdomen Pelvis W Contrast  01/16/2016   CLINICAL DATA:  Initial evaluation for acute right-sided abdominal pain with nausea. EXAM: CT ABDOMEN AND PELVIS WITH CONTRAST TECHNIQUE: Multidetector CT imaging of the abdomen and pelvis was performed using the standard protocol following bolus administration of intravenous contrast. CONTRAST:  144mL OMNIPAQUE IOHEXOL 300 MG/ML SOLN, 44mL OMNIPAQUE IOHEXOL 300 MG/ML SOLN COMPARISON:  None available. FINDINGS: Mild scattered atelectatic changes noted within the visualized lung bases. The visualized lungs are otherwise clear. Liver within normal limits. Gallbladder absent. No biliary dilatation. Spleen, adrenal glands, and pancreas demonstrate a normal contrast enhanced appearance. Kidneys are equal in size with symmetric enhancement. Punctate 3 mm nonobstructive left renal calculus. No hydronephrosis. No focal enhancing renal mass. Mild gastric wall thickening present near the gastric antrum/ pylorus noted, most likely related to incomplete distension. Stomach otherwise unremarkable. No evidence for small bowel obstruction. There is extensive inflammatory changes about the dilated appendix in the right lower quadrant which measures up to 1.9 cm. No definite frank periappendiceal abscess. No free air to suggest perforation. B more distal aspect of the appendix is relatively normal in appearance without significant inflammatory changes. Colon is normal in appearance. Sigmoid diverticulosis without evidence for acute diverticulitis. Bladder decompressed but grossly normal. Prostate  mildly enlarged measuring 5.5 cm in diameter. No free air. Small amount of free fluid along the right pericolic gutter related to the inflamed appendix. No pathologically enlarged intra-abdominal or pelvic lymph nodes. No acute osseous abnormality. Postoperative changes noted within the lumbar spine. Multilevel degenerative changes present. No worrisome lytic or blastic osseous lesions. IMPRESSION: 1. Findings consistent with acute  appendicitis. Significant periappendiceal inflammatory stranding without frank periappendiceal abscess or evidence for perforation. 2. Sigmoid diverticulosis without evidence for acute diverticulitis. 3. 3 mm nonobstructive left renal calculus. Electronically Signed   By: Jeannine Boga M.D.   On: 01/16/2016 06:57    Anti-infectives: Anti-infectives    Start     Dose/Rate Route Frequency Ordered Stop   01/17/16 0115  metroNIDAZOLE (FLAGYL) IVPB 500 mg  Status:  Discontinued     500 mg 100 mL/hr over 60 Minutes Intravenous Every 6 hours 01/16/16 1915 01/16/16 1920   01/16/16 2000  ceFEPIme (MAXIPIME) 2 g in dextrose 5 % 50 mL IVPB     2 g 100 mL/hr over 30 Minutes Intravenous 3 times per day 01/16/16 1915     01/16/16 2000  metroNIDAZOLE (FLAGYL) IVPB 500 mg     500 mg 100 mL/hr over 60 Minutes Intravenous Every 8 hours 01/16/16 1915     01/16/16 1315  metroNIDAZOLE (FLAGYL) IVPB 500 mg  Status:  Discontinued     500 mg 100 mL/hr over 60 Minutes Intravenous Every 6 hours 01/16/16 1000 01/16/16 1915   01/16/16 0945  cefTRIAXone (ROCEPHIN) 2 g in dextrose 5 % 50 mL IVPB  Status:  Discontinued    Comments:  Pharmacy may adjust dosing strength, interval, or rate of medication as needed for optimal therapy for the patient Send with patient on call to the OR.  Anesthesia to complete antibiotic administration <2min prior to incision per [facility].   2 g 100 mL/hr over 30 Minutes Intravenous Every 24 hours 01/16/16 0937 01/16/16 1000   01/16/16 0945  metroNIDAZOLE (FLAGYL) IVPB 500 mg  Status:  Discontinued     500 mg 100 mL/hr over 60 Minutes Intravenous Every 6 hours 01/16/16 0937 01/16/16 1000   01/16/16 0715  cefTRIAXone (ROCEPHIN) 2 g in dextrose 5 % 50 mL IVPB     2 g 100 mL/hr over 30 Minutes Intravenous  Once 01/16/16 0702 01/16/16 0814   01/16/16 0715  metroNIDAZOLE (FLAGYL) IVPB 500 mg     500 mg 100 mL/hr over 60 Minutes Intravenous  Once 01/16/16 [id number] 01/16/16 [id number]       Assessment Perforated appendicitis with abscess s/p laparoscopic appendectomy and drainage of abscess 01/16/16 (Dr. Hilaria Ota nausea.  OSA-using CPAP  HTN-on Metoprolol    LOS: 1 day   Plan: Continue on clear liquids.  Continue IV abxs.  OOB.   Cameron [name] 01/17/2016

## 2016-01-18 MED ORDER — KCL IN DEXTROSE-NACL 20-5-0.9 MEQ/L-%-% IV SOLN
INTRAVENOUS | Status: DC
  Administered 2016-01-18 – 2016-01-22 (×5): via INTRAVENOUS
  Filled 2016-01-18 (×11): qty 1000

## 2016-01-18 NOTE — Progress Notes (Signed)
Patient setup with home mask and places himself on CPAP.

## 2016-01-18 NOTE — Progress Notes (Signed)
2 Days Post-Op  Subjective: He still has nausea.  Objective: Vital signs in last 24 hours: Temp:  [98.5 F (36.9 C)-98.8 F (37.1 C)] 98.5 F (36.9 C) (03/12 0542) Pulse Rate:  [80-91] 91 (03/12 0542) Resp:  [18] 18 (03/12 0542) BP: (134-151)/(71-84) 135/71 mmHg (03/12 0542) SpO2:  [95 %-100 %] 97 % (03/12 0542) Last BM Date: 01/16/16 (pta, )  Intake/Output from previous day: 03/11 0701 - 03/12 0700 In: 3508.9 [P.O.:240; I.V.:3218.9; IV Piggyback:50] Out: 1420 [Urine:1000; Drains:420] Intake/Output this shift: Total I/O In: 200 [P.O.:200] Out: -   PE: General- In NAD Abdomen-soft, quiet, dressing dry, serous drain output  Lab Results:   Recent Labs  01/16/16 0155 01/16/16 1952  WBC 7.0 12.9*  HGB 11.8* 12.0*  HCT 36.0* 38.4*  PLT 240 230   BMET  Recent Labs  01/16/16 0155 01/16/16 1952  NA 139  --   K 3.6  --   CL 105  --   CO2 28  --   GLUCOSE 127*  --   BUN 11  --   CREATININE 0.82 1.01  CALCIUM 8.7*  --    PT/INR No results for input(s): LABPROT, INR in the last 72 hours. Comprehensive Metabolic Panel:    Component Value Date/Time   NA 139 01/16/2016 0155   NA 137 08/22/2015 1353   K 3.6 01/16/2016 0155   K 4.0 08/22/2015 1353   CL 105 01/16/2016 0155   CL 101 08/22/2015 1353   CO2 28 01/16/2016 0155   CO2 28 08/22/2015 1353   BUN 11 01/16/2016 0155   BUN 10 08/22/2015 1353   CREATININE 1.01 01/16/2016 1952   CREATININE 0.82 01/16/2016 0155   GLUCOSE 127* 01/16/2016 0155   GLUCOSE 86 08/22/2015 1353   CALCIUM 8.7* 01/16/2016 0155   CALCIUM 9.0 08/22/2015 1353   AST 22 01/16/2016 0155   AST 17 07/28/2015 0839   ALT 21 01/16/2016 0155   ALT 18 07/28/2015 0839   ALKPHOS 94 01/16/2016 0155   ALKPHOS 98 07/23/2014 0842   BILITOT 1.0 01/16/2016 0155   BILITOT 0.7 07/23/2014 0842   PROT 6.8 01/16/2016 0155   PROT 6.9 07/23/2014 0842   ALBUMIN 3.8 01/16/2016 0155   ALBUMIN 3.8 07/23/2014 0842     Studies/Results: No results  found.  Anti-infectives: Anti-infectives    Start     Dose/Rate Route Frequency Ordered Stop   01/17/16 0115  metroNIDAZOLE (FLAGYL) IVPB 500 mg  Status:  Discontinued     500 mg 100 mL/hr over 60 Minutes Intravenous Every 6 hours 01/16/16 1915 01/16/16 1920   01/16/16 2000  ceFEPIme (MAXIPIME) 2 g in dextrose 5 % 50 mL IVPB     2 g 100 mL/hr over 30 Minutes Intravenous 3 times per day 01/16/16 1915     01/16/16 2000  metroNIDAZOLE (FLAGYL) IVPB 500 mg     500 mg 100 mL/hr over 60 Minutes Intravenous Every 8 hours 01/16/16 1915     01/16/16 1315  metroNIDAZOLE (FLAGYL) IVPB 500 mg  Status:  Discontinued     500 mg 100 mL/hr over 60 Minutes Intravenous Every 6 hours 01/16/16 1000 01/16/16 1915   01/16/16 0945  cefTRIAXone (ROCEPHIN) 2 g in dextrose 5 % 50 mL IVPB  Status:  Discontinued    Comments:  Pharmacy may adjust dosing strength, interval, or rate of medication as needed for optimal therapy for the patient Send with patient on call to the OR.  Anesthesia to complete antibiotic administration <65min prior  to incision per [facility].   2 g 100 mL/hr over 30 Minutes Intravenous Every 24 hours 01/16/16 0937 01/16/16 1000   01/16/16 0945  metroNIDAZOLE (FLAGYL) IVPB 500 mg  Status:  Discontinued     500 mg 100 mL/hr over 60 Minutes Intravenous Every 6 hours 01/16/16 0937 01/16/16 1000   01/16/16 0715  cefTRIAXone (ROCEPHIN) 2 g in dextrose 5 % 50 mL IVPB     2 g 100 mL/hr over 30 Minutes Intravenous  Once 01/16/16 0702 01/16/16 0814   01/16/16 0715  metroNIDAZOLE (FLAGYL) IVPB 500 mg     500 mg 100 mL/hr over 60 Minutes Intravenous  Once 01/16/16 [id number] 01/16/16 [id number]      Assessment Perforated appendicitis with abscess s/p laparoscopic appendectomy and drainage of abscess 01/16/16 (Dr. Jearld Adjutant with nausea; has an ileus  OSA-using CPAP  HTN-  BP normal on Metoprolol    LOS: 2 days   Plan:  Continue IV abxs.  Wait for ileus to resolve.   Cameron Cid  [name] 01/18/2016

## 2016-01-19 ENCOUNTER — Encounter (HOSPITAL_COMMUNITY): Payer: Self-pay | Admitting: General Surgery

## 2016-01-19 LAB — CBC
HCT: 37.8 % — ABNORMAL LOW (ref 39.0–52.0)
Hemoglobin: 11.8 g/dL — ABNORMAL LOW (ref 13.0–17.0)
MCH: 26.2 pg (ref 26.0–34.0)
MCHC: 31.2 g/dL (ref 30.0–36.0)
MCV: 83.8 fL (ref 78.0–100.0)
Platelets: 257 10*3/uL (ref 150–400)
RBC: 4.51 MIL/uL (ref 4.22–5.81)
RDW: 15.5 % (ref 11.5–15.5)
WBC: 9.8 10*3/uL (ref 4.0–10.5)

## 2016-01-19 LAB — BASIC METABOLIC PANEL
Anion gap: 6 (ref 5–15)
BUN: 7 mg/dL (ref 6–20)
CO2: 29 mmol/L (ref 22–32)
Calcium: 8.3 mg/dL — ABNORMAL LOW (ref 8.9–10.3)
Chloride: 103 mmol/L (ref 101–111)
Creatinine, Ser: 0.82 mg/dL (ref 0.61–1.24)
GFR calc Af Amer: >60 mL/min (ref >60)
GFR calc non Af Amer: >60 mL/min (ref >60)
Glucose, Bld: 138 mg/dL — ABNORMAL HIGH (ref 65–99)
Potassium: 4.1 mmol/L (ref 3.5–5.1)
Sodium: 138 mmol/L (ref 135–145)

## 2016-01-19 MED ORDER — BOOST / RESOURCE BREEZE PO LIQD
1.0000 | Freq: Three times a day (TID) | ORAL | Status: DC
  Administered 2016-01-19 – 2016-01-21 (×3): 1 via ORAL

## 2016-01-19 MED ORDER — METOCLOPRAMIDE HCL 5 MG/ML IJ SOLN
10.0000 mg | Freq: Four times a day (QID) | INTRAMUSCULAR | Status: DC
  Administered 2016-01-19 – 2016-01-21 (×8): 10 mg via INTRAVENOUS
  Filled 2016-01-19 (×8): qty 2

## 2016-01-19 NOTE — Progress Notes (Signed)
Patient ID: Cameron [name], male   DOB: December 27, 1945, 70 y.o.   MRN: [id number]     [address] [place], [place] [id number]    Phone: [phone] FAX: [phone]     Subjective: No n/v. Passing flatus. Appetite is poor and not drinking very much. Good uop. Afebrile. VSS.     Objective:  Vital signs:  Filed Vitals:   01/18/16 0542 01/18/16 1348 01/18/16 2123 01/19/16 0500  BP: 135/71 161/91 151/89 139/82  Pulse: 91 98 89 80  Temp: 98.5 F (36.9 C) 98.6 F (37 C) 98.6 F (37 C) 98.4 F (36.9 C)  TempSrc:  Oral Oral Oral  Resp: 18 18 19 19   Height:      Weight:      SpO2: 97% 97% 94% 93%    Last BM Date: 01/16/16  Intake/Output   Yesterday:  03/12 0701 - 03/13 0700 In: 2970 [P.O.:560; I.V.:1860; IV Piggyback:550] Out: 1980 [Urine:1250; Drains:730] This shift:    Physical Exam: General: Pt awake/alert/oriented x4 in no acute distress Chest: cta.  No chest wall pain w good excursion CV:  Pulses intact.  Regular rhythm MS: Normal AROM mjr joints.  No obvious deformity Abdomen: Soft.  Distended.  Dressing removed, incisions are without erythema, steri strips in place.  llq drain with serous output.  Ext:  SCDs BLE.  No mjr edema.  No cyanosis Skin: No petechiae / purpura   Problem List:   Principal Problem:   Acute appendicitis Active Problems:   Appendicitis with abscess    Results:   Labs: No results found for this or any previous visit (from the past 28 hour(s)).  Imaging / Studies: No results found.  Medications / Allergies:  Scheduled Meds: . aspirin  81 mg Oral Daily  . ceFEPime (MAXIPIME) IV  2 g Intravenous 3 times per day   And  . metronidazole  500 mg Intravenous Q8H  . docusate sodium  100 mg Oral BID  . enoxaparin (LOVENOX) injection  40 mg Subcutaneous Q24H  . feeding supplement  1 Container Oral TID BM  . folic acid  1 mg Oral Daily  . latanoprost  1 drop Both  Eyes QHS  . metoprolol succinate  12.5 mg Oral Daily  . mirabegron ER  50 mg Oral Daily  . multivitamin with minerals  1 tablet Oral Daily  . pantoprazole  40 mg Oral Daily  . polyethylene glycol  17 g Oral Daily  . timolol  1 drop Both Eyes Daily  . vitamin C  1,000 mg Oral BID   Continuous Infusions: . dextrose 5 % and 0.9 % NaCl with KCl 20 mEq/L 100 mL/hr at 01/19/16 0545  . lactated ringers 1 application ([id number])   PRN Meds:.azelastine, bisacodyl, HYDROmorphone (DILAUDID) injection, ondansetron **OR** ondansetron (ZOFRAN) IV, oxyCODONE-acetaminophen, senna-docusate  Antibiotics: Anti-infectives    Start     Dose/Rate Route Frequency Ordered Stop   01/17/16 0115  metroNIDAZOLE (FLAGYL) IVPB 500 mg  Status:  Discontinued     500 mg 100 mL/hr over 60 Minutes Intravenous Every 6 hours 01/16/16 1915 01/16/16 1920   01/16/16 2000  ceFEPIme (MAXIPIME) 2 g in dextrose 5 % 50 mL IVPB     2 g 100 mL/hr over 30 Minutes Intravenous 3 times per day 01/16/16 1915     01/16/16 2000  metroNIDAZOLE (FLAGYL) IVPB 500 mg     500 mg 100 mL/hr over  60 Minutes Intravenous Every 8 hours 01/16/16 1915     01/16/16 1315  metroNIDAZOLE (FLAGYL) IVPB 500 mg  Status:  Discontinued     500 mg 100 mL/hr over 60 Minutes Intravenous Every 6 hours 01/16/16 1000 01/16/16 1915   01/16/16 0945  cefTRIAXone (ROCEPHIN) 2 g in dextrose 5 % 50 mL IVPB  Status:  Discontinued    Comments:  Pharmacy may adjust dosing strength, interval, or rate of medication as needed for optimal therapy for the patient Send with patient on call to the OR.  Anesthesia to complete antibiotic administration <14min prior to incision per [facility].   2 g 100 mL/hr over 30 Minutes Intravenous Every 24 hours 01/16/16 0937 01/16/16 1000   01/16/16 0945  metroNIDAZOLE (FLAGYL) IVPB 500 mg  Status:  Discontinued     500 mg 100 mL/hr over 60 Minutes Intravenous Every 6 hours 01/16/16 0937 01/16/16 1000   01/16/16 0715  cefTRIAXone  (ROCEPHIN) 2 g in dextrose 5 % 50 mL IVPB     2 g 100 mL/hr over 30 Minutes Intravenous  Once 01/16/16 0702 01/16/16 0814   01/16/16 0715  metroNIDAZOLE (FLAGYL) IVPB 500 mg     500 mg 100 mL/hr over 60 Minutes Intravenous  Once 01/16/16 [id number] 01/16/16 [id number]        Assessment/Plan Perforated appendicitis POD#3 laparoscopic appendectomy---Dr. Dalbert Batman  Post op ileus -continue with diet, but may need to back down. -check CBC and BMP -mobilize, IS -drain care(751ml serous output) ID-rocephin, flagyl D#4 VTE prophylaxis-SCD, lovenox HTN-home meds OSA-CPAP FEN-appetite is poor, continue IVF, add breeze Dispo-ileus  Erby Pian, ANP-BC [facility] Pager [phone](7A-4:30P) For consults and floor pages call [phone](7A-4:30P)  01/19/2016  9:14 AM

## 2016-01-19 NOTE — Anesthesia Postprocedure Evaluation (Signed)
Anesthesia Post Note  Patient: Cameron [name]  Procedure(s) Performed: Procedure(s) (LRB): APPENDECTOMY LAPAROSCOPIC (N/A)  Patient location during evaluation: PACU Anesthesia Type: General Level of consciousness: awake and alert Pain management: pain level controlled Vital Signs Assessment: post-procedure vital signs reviewed and stable Respiratory status: spontaneous breathing, nonlabored ventilation, respiratory function stable and patient connected to nasal cannula oxygen Cardiovascular status: blood pressure returned to baseline and stable Postop Assessment: no signs of nausea or vomiting Anesthetic complications: no    Last Vitals:  Filed Vitals:   01/18/16 2123 01/19/16 0500  BP: 151/89 139/82  Pulse: 89 80  Temp: 37 C 36.9 C  Resp: 19 19    Last Pain:  Filed Vitals:   01/19/16 0628  PainSc: Asleep                 Rejeana Fadness S

## 2016-01-19 NOTE — Progress Notes (Signed)
Placed patient on CPAP for the night via auto-mode with minimum pressure set at 10cm and maximum pressure set at 20cm

## 2016-01-20 LAB — URINALYSIS, ROUTINE W REFLEX MICROSCOPIC
Bilirubin Urine: NEGATIVE
Glucose, UA: NEGATIVE mg/dL
Hgb urine dipstick: NEGATIVE
Ketones, ur: NEGATIVE mg/dL
Leukocytes, UA: NEGATIVE
Nitrite: NEGATIVE
Protein, ur: NEGATIVE mg/dL
Specific Gravity, Urine: 1.024 (ref 1.005–1.030)
pH: 7.5 (ref 5.0–8.0)

## 2016-01-20 MED ORDER — BISACODYL 10 MG RE SUPP
10.0000 mg | Freq: Once | RECTAL | Status: AC
  Administered 2016-01-20: 10 mg via RECTAL
  Filled 2016-01-20: qty 1

## 2016-01-20 NOTE — Progress Notes (Signed)
Patient ID: Cameron [name], male   DOB: 1945-12-12, 70 y.o.   MRN: [id number]     [address] [place], [place] [address]    Phone: [phone] FAX: [phone]     Subjective: N/v. Passing flatus. No BM. Afebrile.  VSS. Serous output, 521m/24h.   Objective:  Vital signs:  Filed Vitals:   01/19/16 1351 01/19/16 2244 01/19/16 2245 01/20/16 0517  BP: 142/76 137/77  118/57  Pulse: 82 80 81 82  Temp: 98.6 F (37 C) 98.6 F (37 C)  98.7 F (37.1 C)  TempSrc: Oral Oral  Oral  Resp: 20 18 20 20   Height:      Weight:      SpO2: 96% 94% 98% 93%    Last BM Date:  (pta)  Intake/Output   Yesterday:  03/13 0701 - 03/14 0700 In: 3032 [P.O.:357; I.V.:2375; IV Piggyback:300] Out: 15176[Urine:853; Emesis/NG output:1; Drains:510] This shift:  Total I/O In: -  Out: 528[Drains:50]    Physical Exam: General: Pt awake/alert/oriented x4 in no acute distress Chest: cta. No chest wall pain w good excursion CV: Pulses intact. Regular rhythm MS: Normal AROM mjr joints. No obvious deformity Abdomen: Soft. Distended.hyperactive bowel sounds.  incisions are without erythema, steri strips in place. llq drain with serous output.  Ext: SCDs BLE. No mjr edema. No cyanosis Skin: No petechiae / purpura    Problem List:   Principal Problem:   Acute appendicitis Active Problems:   Appendicitis with abscess    Results:   Labs: Results for orders placed or performed during the hospital encounter of 01/16/16 (from the past 48 hour(s))  CBC     Status: Abnormal   Collection Time: 01/19/16  9:28 AM  Result Value Ref Range   WBC 9.8 4.0 - 10.5 K/uL   RBC 4.51 4.22 - 5.81 MIL/uL   Hemoglobin 11.8 (L) 13.0 - 17.0 g/dL   HCT 37.8 (L) 39.0 - 52.0 %   MCV 83.8 78.0 - 100.0 fL   MCH 26.2 26.0 - 34.0 pg   MCHC 31.2 30.0 - 36.0 g/dL   RDW 15.5 11.5 - 15.5 %   Platelets 257 150 - 400 K/uL  Basic  metabolic panel     Status: Abnormal   Collection Time: 01/19/16  9:28 AM  Result Value Ref Range   Sodium 138 135 - 145 mmol/L   Potassium 4.1 3.5 - 5.1 mmol/L   Chloride 103 101 - 111 mmol/L   CO2 29 22 - 32 mmol/L   Glucose, Bld 138 (H) 65 - 99 mg/dL   BUN 7 6 - 20 mg/dL   Creatinine, Ser 0.82 0.61 - 1.24 mg/dL   Calcium 8.3 (L) 8.9 - 10.3 mg/dL   GFR calc non Af Amer >60 >60 mL/min   GFR calc Af Amer >60 >60 mL/min    Comment: (NOTE) The eGFR has been calculated using the CKD EPI equation. This calculation has not been validated in all clinical situations. eGFR's persistently <60 mL/min signify possible Chronic Kidney Disease.    Anion gap 6 5 - 15    Imaging / Studies: No results found.  Medications / Allergies:  Scheduled Meds: . aspirin  81 mg Oral Daily  . ceFEPime (MAXIPIME) IV  2 g Intravenous 3 times per day   And  . metronidazole  500 mg Intravenous Q8H  . docusate sodium  100 mg Oral BID  .  enoxaparin (LOVENOX) injection  40 mg Subcutaneous Q24H  . feeding supplement  1 Container Oral TID BM  . folic acid  1 mg Oral Daily  . latanoprost  1 drop Both Eyes QHS  . metoCLOPramide (REGLAN) injection  10 mg Intravenous 4 times per day  . metoprolol succinate  12.5 mg Oral Daily  . mirabegron ER  50 mg Oral Daily  . multivitamin with minerals  1 tablet Oral Daily  . pantoprazole  40 mg Oral Daily  . polyethylene glycol  17 g Oral Daily  . timolol  1 drop Both Eyes Daily  . vitamin C  1,000 mg Oral BID   Continuous Infusions: . dextrose 5 % and 0.9 % NaCl with KCl 20 mEq/L 100 mL/hr at 01/19/16 0545  . lactated ringers 1 application (75/10/25 8527)   PRN Meds:.azelastine, bisacodyl, HYDROmorphone (DILAUDID) injection, ondansetron **OR** ondansetron (ZOFRAN) IV, oxyCODONE-acetaminophen, senna-docusate  Antibiotics: Anti-infectives    Start     Dose/Rate Route Frequency Ordered Stop   01/17/16 0115  metroNIDAZOLE (FLAGYL) IVPB 500 mg  Status:  Discontinued      500 mg 100 mL/hr over 60 Minutes Intravenous Every 6 hours 01/16/16 1915 01/16/16 1920   01/16/16 2000  ceFEPIme (MAXIPIME) 2 g in dextrose 5 % 50 mL IVPB     2 g 100 mL/hr over 30 Minutes Intravenous 3 times per day 01/16/16 1915     01/16/16 2000  metroNIDAZOLE (FLAGYL) IVPB 500 mg     500 mg 100 mL/hr over 60 Minutes Intravenous Every 8 hours 01/16/16 1915     01/16/16 1315  metroNIDAZOLE (FLAGYL) IVPB 500 mg  Status:  Discontinued     500 mg 100 mL/hr over 60 Minutes Intravenous Every 6 hours 01/16/16 1000 01/16/16 1915   01/16/16 0945  cefTRIAXone (ROCEPHIN) 2 g in dextrose 5 % 50 mL IVPB  Status:  Discontinued    Comments:  Pharmacy may adjust dosing strength, interval, or rate of medication as needed for optimal therapy for the patient Send with patient on call to the OR.  Anesthesia to complete antibiotic administration <64mn prior to incision per [facility]   2 g 100 mL/hr over 30 Minutes Intravenous Every 24 hours 01/16/16 0937 01/16/16 1000   01/16/16 0945  metroNIDAZOLE (FLAGYL) IVPB 500 mg  Status:  Discontinued     500 mg 100 mL/hr over 60 Minutes Intravenous Every 6 hours 01/16/16 0937 01/16/16 1000   01/16/16 0715  cefTRIAXone (ROCEPHIN) 2 g in dextrose 5 % 50 mL IVPB     2 g 100 mL/hr over 30 Minutes Intravenous  Once 01/16/16 0702 01/16/16 0814   01/16/16 0715  metroNIDAZOLE (FLAGYL) IVPB 500 mg     500 mg 100 mL/hr over 60 Minutes Intravenous  Once 01/16/16 0782403/10/17 02353      Assessment/Plan Perforated appendicitis POD#4 laparoscopic appendectomy---Dr. IDalbert Batman Post op ileus -back down to clear -mobilize, IS -drain care(5153mserous output) -check UA ID-rocephin, flagyl D#4 VTE prophylaxis-SCD, lovenox HTN-home meds OSA-CPAP FEN-IVF Dispo-ileus  EmErby PianANP-BC [facility] Pager [phone](7A-4:30P) For consults and floor pages call [phone](7A-4:30P)  01/20/2016 10:37 AM

## 2016-01-20 NOTE — Progress Notes (Signed)
Placed patient on CPAP for the night via auto mode with minimum pressure set at 10cm and maximum pressure set at 18cm

## 2016-01-20 NOTE — Care Management Note (Signed)
Case Management Note  Patient Details  Name: Cameron [name] MRN: [id number] Date of Birth: 04/25/1946  Subjective/Objective:                    Action/Plan:  UR updated  Expected Discharge Date:                  Expected Discharge Plan:  Home/Self Care  In-House Referral:     Discharge planning Services     Post Acute Care Choice:    Choice offered to:     DME Arranged:    DME Agency:     HH Arranged:    [place] Agency:     Status of Service:  In process, will continue to follow  Medicare Important Message Given:    Date Medicare IM Given:    Medicare IM give by:    Date Additional Medicare IM Given:    Additional Medicare Important Message give by:     If discussed at [place] of Stay Meetings, dates discussed:    Additional Comments:  Marilu Favre, RN 01/20/2016, 1:38 PM

## 2016-01-21 MED ORDER — METOCLOPRAMIDE HCL 5 MG/ML IJ SOLN
5.0000 mg | Freq: Four times a day (QID) | INTRAMUSCULAR | Status: DC
  Administered 2016-01-21 – 2016-01-22 (×4): 5 mg via INTRAVENOUS
  Filled 2016-01-21 (×4): qty 2

## 2016-01-21 NOTE — Progress Notes (Signed)
Patient ID: Cameron [name], male   DOB: [birth year], 70 y.o.   MRN: [id number]     [address] [place], [place] [id number]    Phone: [phone] FAX: [phone]     Subjective: No further nausea.  Had 2 BMs yesterday.  Tolerated clears plus crackers and bread. Ambulating.  Feeling much better.   Objective:  Vital signs:  Filed Vitals:   01/20/16 0517 01/20/16 2208 01/20/16 2317 01/21/16 0538  BP: 118/57 124/64  143/79  Pulse: 82 84 77 70  Temp: 98.7 F (37.1 C) 98.8 F (37.1 C)  98.7 F (37.1 C)  TempSrc: Oral Oral  Oral  Resp: 20 18 18 18   Height:      Weight:      SpO2: 93% 97%  96%    Last BM Date:  (PTA)  Intake/Output   Yesterday:  03/14 0701 - 03/15 0700 In: 1530 [P.O.:480; I.V.:900; IV Piggyback:150] Out: 185 [Drains:185] This shift:    I/O last 3 completed shifts: In: [id number] [P.O.:837; I.V.:1950; IV Piggyback:450] Out: [id number] [Urine:850; Drains:355]    Physical Exam: General: Pt awake/alert/oriented x4 in no acute distress Chest: cta. No chest wall pain w good excursion CV: Pulses intact. Regular rhythm MS: Normal AROM mjr joints. No obvious deformity Abdomen: Soft. +bs.  incisions are without erythema, steri strips in place. llq drain with serous output.  Ext: SCDs BLE. No mjr edema. No cyanosis Skin: No petechiae / purpura   Problem List:   Principal Problem:   Acute appendicitis Active Problems:   Appendicitis with abscess    Results:   Labs: Results for orders placed or performed during the hospital encounter of 01/16/16 (from the past 48 hour(s))  Urinalysis, Routine w reflex microscopic (not at [facility])     Status: Abnormal   Collection Time: 01/20/16 10:55 AM  Result Value Ref Range   Color, Urine AMBER (A) YELLOW    Comment: BIOCHEMICALS MAY BE AFFECTED BY COLOR   APPearance CLEAR CLEAR   Specific Gravity, Urine 1.024 1.005 - 1.030   pH 7.5 5.0 - 8.0    Glucose, UA NEGATIVE NEGATIVE mg/dL   Hgb urine dipstick NEGATIVE NEGATIVE   Bilirubin Urine NEGATIVE NEGATIVE   Ketones, ur NEGATIVE NEGATIVE mg/dL   Protein, ur NEGATIVE NEGATIVE mg/dL   Nitrite NEGATIVE NEGATIVE   Leukocytes, UA NEGATIVE NEGATIVE    Comment: MICROSCOPIC NOT DONE ON URINES WITH NEGATIVE PROTEIN, BLOOD, LEUKOCYTES, NITRITE, OR GLUCOSE <1000 mg/dL.    Imaging / Studies: No results found.  Medications / Allergies:  Scheduled Meds: . aspirin  81 mg Oral Daily  . ceFEPime (MAXIPIME) IV  2 g Intravenous 3 times per day   And  . metronidazole  500 mg Intravenous Q8H  . docusate sodium  100 mg Oral BID  . enoxaparin (LOVENOX) injection  40 mg Subcutaneous Q24H  . feeding supplement  1 Container Oral TID BM  . folic acid  1 mg Oral Daily  . latanoprost  1 drop Both Eyes QHS  . metoCLOPramide (REGLAN) injection  10 mg Intravenous 4 times per day  . metoprolol succinate  12.5 mg Oral Daily  . mirabegron ER  50 mg Oral Daily  . multivitamin with minerals  1 tablet Oral Daily  . pantoprazole  40 mg Oral Daily  . polyethylene glycol  17 g Oral Daily  . timolol  1 drop Both Eyes Daily  . vitamin C  1,000 mg Oral BID   Continuous Infusions: . dextrose 5 % and 0.9 % NaCl with KCl 20 mEq/L 100 mL/hr at 01/20/16 2243  . lactated ringers 1 application ([id number])   PRN Meds:.azelastine, bisacodyl, HYDROmorphone (DILAUDID) injection, ondansetron **OR** ondansetron (ZOFRAN) IV, oxyCODONE-acetaminophen, senna-docusate  Antibiotics: Anti-infectives    Start     Dose/Rate Route Frequency Ordered Stop   01/17/16 0115  metroNIDAZOLE (FLAGYL) IVPB 500 mg  Status:  Discontinued     500 mg 100 mL/hr over 60 Minutes Intravenous Every 6 hours 01/16/16 1915 01/16/16 1920   01/16/16 2000  ceFEPIme (MAXIPIME) 2 g in dextrose 5 % 50 mL IVPB     2 g 100 mL/hr over 30 Minutes Intravenous 3 times per day 01/16/16 1915     01/16/16 2000  metroNIDAZOLE (FLAGYL) IVPB 500 mg     500  mg 100 mL/hr over 60 Minutes Intravenous Every 8 hours 01/16/16 1915     01/16/16 1315  metroNIDAZOLE (FLAGYL) IVPB 500 mg  Status:  Discontinued     500 mg 100 mL/hr over 60 Minutes Intravenous Every 6 hours 01/16/16 1000 01/16/16 1915   01/16/16 0945  cefTRIAXone (ROCEPHIN) 2 g in dextrose 5 % 50 mL IVPB  Status:  Discontinued    Comments:  Pharmacy may adjust dosing strength, interval, or rate of medication as needed for optimal therapy for the patient Send with patient on call to the OR.  Anesthesia to complete antibiotic administration <55min prior to incision per [facility].   2 g 100 mL/hr over 30 Minutes Intravenous Every 24 hours 01/16/16 0937 01/16/16 1000   01/16/16 0945  metroNIDAZOLE (FLAGYL) IVPB 500 mg  Status:  Discontinued     500 mg 100 mL/hr over 60 Minutes Intravenous Every 6 hours 01/16/16 0937 01/16/16 1000   01/16/16 0715  cefTRIAXone (ROCEPHIN) 2 g in dextrose 5 % 50 mL IVPB     2 g 100 mL/hr over 30 Minutes Intravenous  Once 01/16/16 0702 01/16/16 0814   01/16/16 0715  metroNIDAZOLE (FLAGYL) IVPB 500 mg     500 mg 100 mL/hr over 60 Minutes Intravenous  Once 01/16/16 [id number] 01/16/16 [id number]        Assessment/Plan Perforated appendicitis POD#5 laparoscopic appendectomy---Dr. Dalbert Batman  Post op ileus-resolving -advance to fulls, then as tolerated  -mobilize, IS -drain care(161ml serous output) ID-rocephin, flagyl D#5 VTE prophylaxis-SCD, lovenox HTN-home meds OSA-CPAP FEN- Dispo-ileus  Erby Pian, ANP-BC [facility] Pager [phone](7A-4:30P) For consults and floor pages call [phone](7A-4:30P)  01/21/2016 11:28 AM

## 2016-01-22 MED ORDER — OXYCODONE-ACETAMINOPHEN 5-325 MG PO TABS: 1.0000 | ORAL_TABLET | ORAL | Status: DC | PRN

## 2016-01-22 MED ORDER — POLYETHYLENE GLYCOL 3350 17 G PO PACK: 17.0000 g | PACK | Freq: Every day | ORAL | Status: DC

## 2016-01-22 MED ORDER — ONDANSETRON 4 MG PO TBDP: 4.0000 mg | ORAL_TABLET | Freq: Four times a day (QID) | ORAL | Status: DC | PRN

## 2016-01-22 MED ORDER — SENNOSIDES-DOCUSATE SODIUM 8.6-50 MG PO TABS: 1.0000 | ORAL_TABLET | Freq: Every evening | ORAL | Status: DC | PRN

## 2016-01-22 NOTE — Discharge Instructions (Signed)

## 2016-01-22 NOTE — Progress Notes (Signed)
Placed patient on CPAP for the night.  

## 2016-01-22 NOTE — Discharge Summary (Signed)
Physician Discharge Summary  Cameron [name] [id number] DOB: [birth year] DOA: 01/16/2016  PCP: Kathlene November, MD  Consultation: none  Admit date: 01/16/2016 Discharge date: 01/22/2016  Recommendations for Outpatient Follow-up:   Follow-up Information    Follow up with Adin Hector, MD On 02/02/2016.   Specialty:  General Surgery   Why:  arrive by 1:15PM for a 1:45PM post op check    Contact information:   [place] [address] [phone]      Discharge Diagnoses:  1. Perforated appendicitis with abscess 2. Post operative ileus 3. HTN 4. OSA   Surgical Procedure: laparoscopic appendectomy, drainage of abscess---Dr. Dalbert Batman 01/16/16  Discharge Condition: stable Disposition: home  Diet recommendation: regular  Filed Weights   01/16/16 0119  Weight: 149.687 kg (330 lb)       Hospital Course:  Cameron [name] is a 70 year old male with a history of HTN, GERD, diverticulosis, OSA, chronic constipation, cholecystectomy and obesity who presented from [facility] with acute appendicitis.  MCHP work up shows a normal white count with stable vital signs. Ct of abdomen and pelvis significant periappendiceal inflammatory stranding consistent with acute appendicitis. he was transferred to [facility].  The patient was admitted and underwent the procedure listed above.  Found to have a perforated appendix with a walled off abscess.  He remained inpatient for IV antibiotics.  Developed a post operative ileus which resolved with bowel rest.  Hypertension and OSA remained stable.  He remained afebrile and had a normal white count.  On  POD#6 he was tolerating POs, having BMs, VSS, afebrile, pain controlled and therefore felt stable for discharge home.  Drain with removed as it continued to drain serous output.  Medication risks, benefits and therapeutic alternatives were reviewed with the patient.  He verbalizes understanding.  He was encouraged to call with questions or  concerns.  Follow up was arranged on his behalf prior to discharge.    Physical Exam: General: Pt awake/alert/oriented x4 in no acute distress Chest: cta. No chest wall pain w good excursion CV: Pulses intact. Regular rhythm MS: Normal AROM mjr joints. No obvious deformity Abdomen: Soft. non distended. incisions are without erythema, steri strips in place. llq drain with serous output.  Ext: SCDs BLE. No mjr edema. No cyanosis Skin: No petechiae / purpura   Discharge Instructions     Medication List    STOP taking these medications        doxycycline 100 MG tablet  Commonly known as:  VIBRA-TABS     HYDROcodone-homatropine 5-1.5 MG/5ML syrup  Commonly known as:  HYCODAN      TAKE these medications        albuterol 108 (90 Base) MCG/ACT inhaler  Commonly known as:  VENTOLIN HFA  Inhale 2 puffs into the lungs every 6 (six) hours as needed for wheezing or shortness of breath.     aspirin 81 MG tablet  Take 81 mg by mouth daily.     azelastine 0.1 % nasal spray  Commonly known as:  ASTELIN  Place 2 sprays into both nostrils at bedtime as needed for rhinitis. Use in each nostril as directed     folic acid 1 MG tablet  Commonly known as:  FOLVITE  Take 1 tablet (1 mg total) by mouth daily.     lansoprazole 30 MG capsule  Commonly known as:  PREVACID  Take 30 mg by mouth every morning.     latanoprost 0.005 % ophthalmic solution  Commonly known as:  XALATAN  Place 1 drop into both eyes at bedtime.     metoprolol succinate 25 MG 24 hr tablet  Commonly known as:  TOPROL-XL  Take 0.5 tablets (12.5 mg total) by mouth daily.     multivitamin with minerals Tabs tablet  Take 1 tablet by mouth daily.     MYRBETRIQ 50 MG Tb24 tablet  Generic drug:  mirabegron ER  Take 50 mg by mouth daily.     ondansetron 4 MG disintegrating tablet  Commonly known as:  ZOFRAN-ODT  Take 1 tablet (4 mg total) by mouth every 6 (six) hours as needed for nausea.      oxyCODONE-acetaminophen 5-325 MG tablet  Commonly known as:  PERCOCET/ROXICET  Take 1-2 tablets by mouth every 4 (four) hours as needed for moderate pain.     polyethylene glycol packet  Commonly known as:  MIRALAX / GLYCOLAX  Take 17 g by mouth daily.     senna-docusate 8.6-50 MG tablet  Commonly known as:  Senokot-S  Take 1 tablet by mouth at bedtime as needed for mild constipation.     timolol 0.5 % ophthalmic solution  Commonly known as:  BETIMOL  Place 1 drop into both eyes daily. Reported on 01/16/2016     vitamin C 1000 MG tablet  Take 1,000 mg by mouth 2 (two) times daily.           Follow-up Information    Follow up with [facility] On 02/04/2016.   Specialty:  General Surgery   Why:  arrive by 10:30AM for a 11AM post op check    Contact information:   [place] [address] [phone]        The results of significant diagnostics from this hospitalization (including imaging, microbiology, ancillary and laboratory) are listed below for reference.    Significant Diagnostic Studies: Ct Abdomen Pelvis W Contrast  01/16/2016  CLINICAL DATA:  Initial evaluation for acute right-sided abdominal pain with nausea. EXAM: CT ABDOMEN AND PELVIS WITH CONTRAST TECHNIQUE: Multidetector CT imaging of the abdomen and pelvis was performed using the standard protocol following bolus administration of intravenous contrast. CONTRAST:  131mL OMNIPAQUE IOHEXOL 300 MG/ML SOLN, 95mL OMNIPAQUE IOHEXOL 300 MG/ML SOLN COMPARISON:  None available. FINDINGS: Mild scattered atelectatic changes noted within the visualized lung bases. The visualized lungs are otherwise clear. Liver within normal limits. Gallbladder absent. No biliary dilatation. Spleen, adrenal glands, and pancreas demonstrate a normal contrast enhanced appearance. Kidneys are equal in size with symmetric enhancement. Punctate 3 mm nonobstructive left renal calculus. No hydronephrosis. No focal  enhancing renal mass. Mild gastric wall thickening present near the gastric antrum/ pylorus noted, most likely related to incomplete distension. Stomach otherwise unremarkable. No evidence for small bowel obstruction. There is extensive inflammatory changes about the dilated appendix in the right lower quadrant which measures up to 1.9 cm. No definite frank periappendiceal abscess. No free air to suggest perforation. B more distal aspect of the appendix is relatively normal in appearance without significant inflammatory changes. Colon is normal in appearance. Sigmoid diverticulosis without evidence for acute diverticulitis. Bladder decompressed but grossly normal. Prostate mildly enlarged measuring 5.5 cm in diameter. No free air. Small amount of free fluid along the right pericolic gutter related to the inflamed appendix. No pathologically enlarged intra-abdominal or pelvic lymph nodes. No acute osseous abnormality. Postoperative changes noted within the lumbar spine. Multilevel degenerative changes present. No worrisome lytic or blastic osseous lesions. IMPRESSION: 1.  Findings consistent with acute appendicitis. Significant periappendiceal inflammatory stranding without frank periappendiceal abscess or evidence for perforation. 2. Sigmoid diverticulosis without evidence for acute diverticulitis. 3. 3 mm nonobstructive left renal calculus. Electronically Signed   By: Jeannine Boga M.D.   On: 01/16/2016 06:57    Microbiology: No results found for this or any previous visit (from the past 240 hour(s)).   Labs: Basic Metabolic Panel:  Recent Labs Lab 01/16/16 0155 01/16/16 1952 01/19/16 0928  NA 139  --  138  K 3.6  --  4.1  CL 105  --  103  CO2 28  --  29  GLUCOSE 127*  --  138*  BUN 11  --  7  CREATININE 0.82 1.01 0.82  CALCIUM 8.7*  --  8.3*   Liver Function Tests:  Recent Labs Lab 01/16/16 0155  AST 22  ALT 21  ALKPHOS 94  BILITOT 1.0  PROT 6.8  ALBUMIN 3.8    Recent  Labs Lab 01/16/16 0155  LIPASE 27   No results for input(s): AMMONIA in the last 168 hours. CBC:  Recent Labs Lab 01/16/16 0155 01/16/16 1952 01/19/16 0928  WBC 7.0 12.9* 9.8  NEUTROABS 5.4  --   --   HGB 11.8* 12.0* 11.8*  HCT 36.0* 38.4* 37.8*  MCV 82.6 84.0 83.8  PLT 240 230 257   Cardiac Enzymes: No results for input(s): CKTOTAL, CKMB, CKMBINDEX, TROPONINI in the last 168 hours. BNP: BNP (last 3 results) No results for input(s): BNP in the last 8760 hours.  ProBNP (last 3 results) No results for input(s): PROBNP in the last 8760 hours.  CBG: No results for input(s): GLUCAP in the last 168 hours.  Principal Problem:   Acute appendicitis Active Problems:   Appendicitis with abscess   Time coordinating discharge: <30 mins   Signed:  Renn Dirocco, ANP-BC

## 2016-03-04 ENCOUNTER — Encounter: Payer: Self-pay | Admitting: Gastroenterology

## 2016-03-07 ENCOUNTER — Other Ambulatory Visit: Payer: Self-pay | Admitting: Internal Medicine

## 2016-03-08 NOTE — Telephone Encounter (Signed)
Medication filled to pharmacy as requested.   

## 2016-04-09 ENCOUNTER — Encounter: Payer: BLUE CROSS/BLUE SHIELD | Admitting: Internal Medicine

## 2016-05-17 ENCOUNTER — Other Ambulatory Visit: Payer: Self-pay

## 2016-05-17 DIAGNOSIS — H2513 Age-related nuclear cataract, bilateral: Secondary | ICD-10-CM | POA: Diagnosis not present

## 2016-05-17 DIAGNOSIS — H401131 Primary open-angle glaucoma, bilateral, mild stage: Secondary | ICD-10-CM | POA: Diagnosis not present

## 2016-05-17 DIAGNOSIS — H04123 Dry eye syndrome of bilateral lacrimal glands: Secondary | ICD-10-CM | POA: Diagnosis not present

## 2016-05-17 MED ORDER — METOPROLOL SUCCINATE ER 25 MG PO TB24: 12.5000 mg | ORAL_TABLET | Freq: Every day | ORAL | Status: DC

## 2016-07-08 ENCOUNTER — Encounter: Payer: Self-pay | Admitting: Pulmonary Disease

## 2016-07-08 ENCOUNTER — Ambulatory Visit (INDEPENDENT_AMBULATORY_CARE_PROVIDER_SITE_OTHER): Payer: Medicare Other | Admitting: Pulmonary Disease

## 2016-07-08 DIAGNOSIS — Z23 Encounter for immunization: Secondary | ICD-10-CM | POA: Diagnosis not present

## 2016-07-08 DIAGNOSIS — G4733 Obstructive sleep apnea (adult) (pediatric): Secondary | ICD-10-CM | POA: Diagnosis not present

## 2016-07-08 DIAGNOSIS — Z9989 Dependence on other enabling machines and devices: Principal | ICD-10-CM

## 2016-07-08 DIAGNOSIS — E669 Obesity, unspecified: Secondary | ICD-10-CM | POA: Diagnosis not present

## 2016-07-08 NOTE — Patient Instructions (Signed)
CPAP supplies including nasal pillows will be renewed Check download on machine

## 2016-07-08 NOTE — Assessment & Plan Note (Signed)
Weight loss encouraged 

## 2016-07-08 NOTE — Assessment & Plan Note (Signed)
CPAP supplies including nasal pillows will be renewed Check download on machine  Weight loss encouraged, compliance with goal of at least 4-6 hrs every night is the expectation. Advised against medications with sedative side effects Cautioned against driving when sleepy - understanding that sleepiness will vary on a day to day basis

## 2016-07-08 NOTE — Progress Notes (Signed)
   Subjective:    Patient ID: Cameron [name], male    DOB: [birth year], 70 y.o.   MRN: [id number]  HPI  Chief Complaint  Patient presents with  . Sleep Apnea    Former [facility] patient, doing well on CPAP.  Will obtain DL from [facility].  Discuss getting new machine.  ES: 7    Former [facility] patient, last seen 2013  OSA was diagnosed in 1996 with PSG showing severe degree with AHI 87/hour. He was maintained on CPAP at 20 cm with nasal mask for many years and then switched to nasal pillows, pressure was dropped to 18 cm. He had good results with improvement in daytime somnolence and snoring  He is now retired and would like to get new CPAP supplies Epworth sleepiness score is 7 Bedtime is between 10 PM and midnight, sleep latency is minimal, he sleeps on his side with one pillow, denies frequent nocturnal awakenings and is out of bed in a variable time without dryness of mouth or headaches. Humidifier is working well his weight has remained constant for many years Blood pressure is well controlled on one medication He is he had an appendectomy 01/2016 without any postop complications      Significant tests/ events  NPSG 2001:  AHI 87/hr   Past Medical History:  Diagnosis Date  . Arthritis    degenerative joint disease, knees   . Constipation due to pain medication   . Diverticulosis of colon   . Fatty liver   . GERD (gastroesophageal reflux disease)    takes Prevacid  . Glaucoma (increased eye pressure)   . HTN (hypertension)   . Iron deficiency anemia    h/o  . Keloid of skin   . Lumbar spondylosis    possible right sided radiculopathy with hx of surgery by Dr. Cyndy Freeze in 2003  . Osteopenia    DEXA 12-2006    . Personal history of colonic polyps 2002   TUBULAR ADENOMA  . Prostatitis, acute 04-2011   admitted to [facility]  . Sleep apnea    on CPAP, last study [facility]  . Tick bite      Review of Systems neg for any significant sore throat, dysphagia, itching, sneezing, nasal  congestion or excess/ purulent secretions, fever, chills, sweats, unintended wt loss, pleuritic or exertional cp, hempoptysis, orthopnea pnd or change in chronic leg swelling.   Also denies presyncope, palpitations, heartburn, abdominal pain, nausea, vomiting, diarrhea or change in bowel or urinary habits, dysuria,hematuria, rash, arthralgias, visual complaints, headache, numbness weakness or ataxia.     Objective:   Physical Exam  Gen. Pleasant, obese, in no distress ENT - no lesions, no post nasal drip Neck: No JVD, no thyromegaly, no carotid bruits Lungs: no use of accessory muscles, no dullness to percussion, decreased without rales or rhonchi  Cardiovascular: Rhythm regular, heart sounds  normal, no murmurs or gallops, no peripheral edema Musculoskeletal: No deformities, no cyanosis or clubbing , no tremors       Assessment & Plan:

## 2016-07-19 ENCOUNTER — Telehealth: Payer: Self-pay | Admitting: Pulmonary Disease

## 2016-07-19 DIAGNOSIS — Z9989 Dependence on other enabling machines and devices: Principal | ICD-10-CM

## 2016-07-19 DIAGNOSIS — G4733 Obstructive sleep apnea (adult) (pediatric): Secondary | ICD-10-CM

## 2016-07-19 NOTE — Telephone Encounter (Signed)
Per Dr. Elsworth Soho:  CPAP download from 07/08/16 shows:  Few residuals on 18cm with 7/hr.  20cm would be ideal but okay to stay on current settings.  Increase EPR level to 3.0cm. --------------  Discussed results with patient, he is okay with changing pressure settings.  Order has been sent to [facility].  Nothing further needed.

## 2016-07-29 ENCOUNTER — Telehealth: Payer: Self-pay | Admitting: Pulmonary Disease

## 2016-07-29 DIAGNOSIS — G4733 Obstructive sleep apnea (adult) (pediatric): Secondary | ICD-10-CM

## 2016-07-29 NOTE — Telephone Encounter (Signed)
Spoke with pt, aware of recs.  Order placed.  Nothing further needed.  

## 2016-07-29 NOTE — Telephone Encounter (Signed)
Called spoke with pt. He is requesting an order for a new CPAP machine to be sent to [facility].  Please advise Dr. Elsworth Soho thanks

## 2016-07-29 NOTE — Telephone Encounter (Signed)
Auto CPAP of 15-20 cm, obtain download in follow-up in 6 weeks

## 2016-09-10 ENCOUNTER — Encounter: Payer: Self-pay | Admitting: Internal Medicine

## 2016-09-10 ENCOUNTER — Encounter: Payer: Self-pay | Admitting: Gastroenterology

## 2016-09-10 ENCOUNTER — Ambulatory Visit (INDEPENDENT_AMBULATORY_CARE_PROVIDER_SITE_OTHER): Payer: Medicare Other | Admitting: Internal Medicine

## 2016-09-10 VITALS — BP 128/70 | HR 77 | Temp 97.8°F | Resp 14 | Ht 72.0 in | Wt 322.1 lb

## 2016-09-10 DIAGNOSIS — Z23 Encounter for immunization: Secondary | ICD-10-CM

## 2016-09-10 DIAGNOSIS — Z0000 Encounter for general adult medical examination without abnormal findings: Secondary | ICD-10-CM | POA: Diagnosis not present

## 2016-09-10 DIAGNOSIS — R109 Unspecified abdominal pain: Secondary | ICD-10-CM | POA: Diagnosis not present

## 2016-09-10 DIAGNOSIS — I10 Essential (primary) hypertension: Secondary | ICD-10-CM | POA: Diagnosis not present

## 2016-09-10 DIAGNOSIS — Z1211 Encounter for screening for malignant neoplasm of colon: Secondary | ICD-10-CM

## 2016-09-10 DIAGNOSIS — E559 Vitamin D deficiency, unspecified: Secondary | ICD-10-CM

## 2016-09-10 DIAGNOSIS — E538 Deficiency of other specified B group vitamins: Secondary | ICD-10-CM

## 2016-09-10 LAB — CBC WITH DIFFERENTIAL/PLATELET
Basophils Absolute: 0 cells/uL (ref 0–200)
Basophils Relative: 0 %
Eosinophils Absolute: 106 cells/uL (ref 15–500)
Eosinophils Relative: 2 %
HCT: 42.1 % (ref 38.5–50.0)
Hemoglobin: 13.9 g/dL (ref 13.2–17.1)
Lymphocytes Relative: 32 %
Lymphs Abs: 1696 cells/uL (ref 850–3900)
MCH: 27.5 pg (ref 27.0–33.0)
MCHC: 33 g/dL (ref 32.0–36.0)
MCV: 83.2 fL (ref 80.0–100.0)
MPV: 10.2 fL (ref 7.5–12.5)
Monocytes Absolute: 795 cells/uL (ref 200–950)
Monocytes Relative: 15 %
Neutro Abs: 2703 cells/uL (ref 1500–7800)
Neutrophils Relative %: 51 %
Platelets: 228 10*3/uL (ref 140–400)
RBC: 5.06 MIL/uL (ref 4.20–5.80)
RDW: 15.2 % — ABNORMAL HIGH (ref 11.0–15.0)
WBC: 5.3 10*3/uL (ref 3.8–10.8)

## 2016-09-10 NOTE — Progress Notes (Signed)
Pre visit review using our clinic review tool, if applicable. No additional management support is needed unless otherwise documented below in the visit note. 

## 2016-09-10 NOTE — Assessment & Plan Note (Addendum)
Td 2017; zostavax 2014; pnm shot --2015; prevnar 09-2016. Had a Flu shot [facility]: Colonoscopy: 05/08/2004, neg--no polyps  Colonoscopy: 12-2009-- tics Cscope again 05-03-11, polyp, 5 years . Refer to GI today 09-2016 Prostate ca screening:  PSA per urology  Dietexercise discussed.

## 2016-09-10 NOTE — Progress Notes (Signed)
Subjective:    Patient ID: Cameron [name], male    DOB: [birth year], 70 y.o.   MRN: [id number]  DOS:  09/10/2016 Type of visit - description :   Here for Medicare AWV: 1. Risk factors based on Past M, S, F history: reviewed 2. Physical Activities:  Tennis, yard work 3. Depression/mood: neg screening  4. Hearing:  No problems noted or reported  5. ADL's: independent, drives  6. Fall Risk: no recent falls, prevention discussed , see AVS 7. home Safety: does feel safe at home  8. Height, weight, & visual acuity: see VS, sees eye doctor regularlly 9. Counseling: provided 10. Labs ordered based on risk factors: if needed  11. Referral Coordination: if needed 12. Care Plan, see assessment and plan , written personalized plan provided , see AVS 13. Cognitive Assessment: motor skills and cognition appropriate for age 6. Care team updated today 15. End-of-life care: rec a HC POA  In addition, today we discussed the following: HTN: Good compliance of medication, ambulatory BPs usually normal GERD: Not taking PPIs consistently, using natural remedies with good results Also complaining of right lateral low abdominal pain, described as a gas, decrease after a BM. Denies any fever, chills, blood in the stools, dysuria, gross hematuria.   Review of Systems Constitutional: No fever. No chills. No unexplained wt changes. No unusual sweats  HEENT: No dental problems, no ear discharge, no facial swelling, no voice changes. No eye discharge, no eye  redness , no  intolerance to light   Respiratory: No wheezing , no  difficulty breathing. No cough , no mucus production  Cardiovascular: No CP, no leg swelling , no  Palpitations  GI:  See above. no nausea, no vomiting, no diarrhea  No blood in the stools. No dysphagia, no odynophagia    Endocrine: No polyphagia, no polyuria , no polydipsia  GU: No dysuria, gross hematuria, difficulty urinating. No urinary urgency, no  frequency.  Musculoskeletal: Occasional localized left elbow pain, mostly when he keeps his arm in the same position for a while low ; no swelling, injury, redness  SKIN: no  Rash  Allergic, immunologic: No environmental allergies , no  food allergies  Neurological: No dizziness no  syncope. No headaches. No diplopia, no slurred, no slurred speech, no motor deficits, no facial  Numbness  Hematological: No enlarged lymph nodes, no easy bruising , no unusual bleedings  Psychiatry: No suicidal ideas, no hallucinations, no beavior problems, no confusion.  No unusual/severe anxiety, no depression   Past Medical History:  Diagnosis Date  . Arthritis    degenerative joint disease, knees   . Constipation due to pain medication   . Diverticulosis of colon   . Fatty liver   . GERD (gastroesophageal reflux disease)    takes Prevacid  . Glaucoma (increased eye pressure)   . HTN (hypertension)   . Iron deficiency anemia    h/o  . Keloid of skin   . Lumbar spondylosis    possible right sided radiculopathy with hx of surgery by Dr. Cyndy Freeze in 2003  . Osteopenia    DEXA 12-2006    . Personal history of colonic polyps 2002   TUBULAR ADENOMA  . Prostatitis, acute 04-2011   admitted to [facility]  . Sleep apnea    on CPAP, last study [facility]  . Tick bite     Past Surgical History:  Procedure Laterality Date  . BACK SURGERY  2003  . CHOLECYSTECTOMY  2003  . COLONOSCOPY    .  LAPAROSCOPIC APPENDECTOMY N/A 01/16/2016   Procedure: APPENDECTOMY LAPAROSCOPIC;  Surgeon: Fanny Skates, MD;  Location: [place];  Service: General;  Laterality: N/A;  . LUMBAR LAMINECTOMY/DECOMPRESSION MICRODISCECTOMY N/A 08/22/2015   Procedure: Lumbar two to lumbar three lumbar three to lumbar four microdiskectomy;  Surgeon: Ashok Pall, MD;  Location: [facility];  Service: Neurosurgery;  Laterality: N/A;  L23 L34 microdiskectomy  . TOTAL KNEE ARTHROPLASTY Right 06/11/2014   Procedure: TOTAL KNEE ARTHROPLASTY;  Surgeon: Hessie Dibble, MD;  Location: [place];  Service: Orthopedics;  Laterality: Right;  . TOTAL KNEE ARTHROPLASTY Left 10/08/2014   DR DALLDORF  . TOTAL KNEE ARTHROPLASTY Left 10/08/2014   Procedure: TOTAL LEFT KNEE ARTHROPLASTY;  Surgeon: Hessie Dibble, MD;  Location: [place];  Service: Orthopedics;  Laterality: Left;    Social History   Social History  . Marital status: Married    Spouse name: N/A  . Number of children: 2  . Years of education: N/A   Occupational History  . retired--works in HR    Social History Main Topics  . Smoking status: Never Smoker  . Smokeless tobacco: Never Used  . Alcohol use Yes     Comment: socially  . Drug use: No  . Sexual activity: Not on file   Other Topics Concern  . Not on file   Social History Narrative    plays tennis sometimes      Family History  Problem Relation Age of Onset  . Dementia Mother   . Diverticulitis Father   . Colon cancer Maternal Uncle     dx age 44s  . Colon cancer Other     grandfather, dx age 10  . Stroke Other     maternal aunt in her 56s  . Prostate cancer Neg Hx   . Heart attack Neg Hx   . Hypertension Neg Hx   . Diabetes Neg Hx       Medication List       Accurate as of 09/10/16 11:59 PM. Always use your most recent med list.          albuterol 108 (90 Base) MCG/ACT inhaler Commonly known as:  VENTOLIN HFA Inhale 2 puffs into the lungs every 6 (six) hours as needed for wheezing or shortness of breath.   aspirin 81 MG tablet Take 81 mg by mouth daily.   folic acid 1 MG tablet Commonly known as:  FOLVITE TAKE 1 TABLET(1 MG) BY MOUTH DAILY   latanoprost 0.005 % ophthalmic solution Commonly known as:  XALATAN Place 1 drop into both eyes at bedtime.   metoprolol succinate 25 MG 24 hr tablet Commonly known as:  TOPROL-XL Take 0.5 tablets (12.5 mg total) by mouth daily.   multivitamin with minerals Tabs tablet Take 1 tablet by mouth daily.   MYRBETRIQ 50 MG Tb24 tablet Generic drug:   mirabegron ER Take 50 mg by mouth daily.   timolol 0.5 % ophthalmic solution Commonly known as:  BETIMOL Place 1 drop into both eyes daily. Reported on 01/16/2016   vitamin C 1000 MG tablet Take 1,000 mg by mouth 2 (two) times daily.          Objective:   Physical Exam BP 128/70 (BP Location: Left Arm, Patient Position: Sitting, Cuff Size: Normal)   Pulse 77   Temp 97.8 F (36.6 C) (Oral)   Resp 14   Ht 6' (1.829 m)   Wt (!) 322 lb 2 oz (146.1 kg)   SpO2 97%  BMI 43.69 kg/m   General:   Well developed, morbidly obese in no distress  Neck: No  thyromegaly  HEENT:  Normocephalic . Face symmetric, atraumatic Lungs:  CTA B Normal respiratory effort, no intercostal retractions, no accessory muscle use. Heart: RRR,  no murmur.  No pretibial edema bilaterally  Abdomen:  Not distended, soft, non-tender. No rebound or rigidity.   Skin: Exposed areas without rash. Not pale. Not jaundice Neurologic:  alert & oriented X3.  Speech normal, gait appropriate for age and unassisted Strength symmetric and appropriate for age.  Psych: Cognition and judgment appear intact.  Cooperative with normal attention span and concentration.  Behavior appropriate. No anxious or depressed appearing.    Assessment & Plan:   Assessment Hypertension OSA on CPAP GI: GERD, Fatty liver B12 and vit D def RAD (occ wheezing w/ URIs, inhalers) DJD: lumbar spondylosis, back surgery 2003, knee replacements GU:--LUTS  Dr Venia Minks ([facility]) --h/o prostatitis admitted 2012 [facility] Glaucoma Osteopenia per DEXA 2008, multiple attempts to redo bone density test pale.  h/o iron deficiency   PLAN:   HTN: Continue metoprolol, check a BMP, CBC and cholesterol B12 and vitamin D deficiencies: Recommend supplements, last time levels were checked were okay. Right abdomen/flank pain: Abdominal exam (-), location of the pain is somewhat atypical for abdomen pain or GI sx, will check a UA and recommend observation. RTC  one year

## 2016-09-10 NOTE — Patient Instructions (Signed)
Get your blood work before you leave    Will refer you to the GI doctor  Take calcium, B12 and vitamin D supplements daily  Please consider having a healthcare power of attorney  Next visit in one year    Fall Prevention and Home Safety Falls cause injuries and can affect all age groups. It is possible to use preventive measures to significantly decrease the likelihood of falls. There are many simple measures which can make your home safer and prevent falls. OUTDOORS  Repair cracks and edges of walkways and driveways.  Remove high doorway thresholds.  Trim shrubbery on the main path into your home.  Have good outside lighting.  Clear walkways of tools, rocks, debris, and clutter.  Check that handrails are not broken and are securely fastened. Both sides of steps should have handrails.  Have leaves, snow, and ice cleared regularly.  Use sand or salt on walkways during winter months.  In the garage, clean up grease or oil spills. BATHROOM  Install night lights.  Install grab bars by the toilet and in the tub and shower.  Use non-skid mats or decals in the tub or shower.  Place a plastic non-slip stool in the shower to sit on, if needed.  Keep floors dry and clean up all water on the floor immediately.  Remove soap buildup in the tub or shower on a regular basis.  Secure bath mats with non-slip, double-sided rug tape.  Remove throw rugs and tripping hazards from the floors. BEDROOMS  Install night lights.  Make sure a bedside light is easy to reach.  Do not use oversized bedding.  Keep a telephone by your bedside.  Have a firm chair with side arms to use for getting dressed.  Remove throw rugs and tripping hazards from the floor. KITCHEN  Keep handles on pots and pans turned toward the center of the stove. Use back burners when possible.  Clean up spills quickly and allow time for drying.  Avoid walking on wet floors.  Avoid hot utensils and  knives.  Position shelves so they are not too high or low.  Place commonly used objects within easy reach.  If necessary, use a sturdy step stool with a grab bar when reaching.  Keep electrical cables out of the way.  Do not use floor polish or wax that makes floors slippery. If you must use wax, use non-skid floor wax.  Remove throw rugs and tripping hazards from the floor. STAIRWAYS  Never leave objects on stairs.  Place handrails on both sides of stairways and use them. Fix any loose handrails. Make sure handrails on both sides of the stairways are as long as the stairs.  Check carpeting to make sure it is firmly attached along stairs. Make repairs to worn or loose carpet promptly.  Avoid placing throw rugs at the top or bottom of stairways, or properly secure the rug with carpet tape to prevent slippage. Get rid of throw rugs, if possible.  Have an electrician put in a light switch at the top and bottom of the stairs. OTHER FALL PREVENTION TIPS  Wear low-heel or rubber-soled shoes that are supportive and fit well. Wear closed toe shoes.  When using a stepladder, make sure it is fully opened and both spreaders are firmly locked. Do not climb a closed stepladder.  Add color or contrast paint or tape to grab bars and handrails in your home. Place contrasting color strips on first and last steps.  Learn and  use mobility aids as needed. Install an electrical emergency response system.  Turn on lights to avoid dark areas. Replace light bulbs that burn out immediately. Get light switches that glow.  Arrange furniture to create clear pathways. Keep furniture in the same place.  Firmly attach carpet with non-skid or double-sided tape.  Eliminate uneven floor surfaces.  Select a carpet pattern that does not visually hide the edge of steps.  Be aware of all pets. OTHER HOME SAFETY TIPS  Set the water temperature for 120 F (48.8 C).  Keep emergency numbers on or near the  telephone.  Keep smoke detectors on every level of the home and near sleeping areas. Document Released: 10/15/2002 Document Revised: 04/25/2012 Document Reviewed: 01/14/2012 [facility] Patient Information 2015 [place], [place]. This information is not intended to replace advice given to you by your health care provider. Make sure you discuss any questions you have with your health care provider.   Preventive Care for Adults Ages 29 and over  Blood pressure check.** / Every 1 to 2 years.  Lipid and cholesterol check.**/ Every 5 years beginning at age 6.  Lung cancer screening. / Every year if you are aged 69-80 years and have a 30-pack-year history of smoking and currently smoke or have quit within the past 15 years. Yearly screening is stopped once you have quit smoking for at least 15 years or develop a health problem that would prevent you from having lung cancer treatment.  Fecal occult blood test (FOBT) of stool. / Every year beginning at age 73 and continuing until age 52. You may not have to do this test if you get a colonoscopy every 10 years.  Flexible sigmoidoscopy** or colonoscopy.** / Every 5 years for a flexible sigmoidoscopy or every 10 years for a colonoscopy beginning at age 19 and continuing until age 74.  Hepatitis C blood test.** / For all people born from [birth year] through 1963 and any individual with known risks for hepatitis C.  Abdominal aortic aneurysm (AAA) screening.** / A one-time screening for ages 43 to 34 years who are current or former smokers.  Skin self-exam. / Monthly.  Influenza vaccine. / Every year.  Tetanus, diphtheria, and acellular pertussis (Tdap/Td) vaccine.** / 1 dose of Td every 10 years.  Varicella vaccine.** / Consult your health care provider.  Zoster vaccine.** / 1 dose for adults aged 51 years or older.  Pneumococcal 13-valent conjugate (PCV13) vaccine.** / Consult your health care provider.  Pneumococcal polysaccharide (PPSV23) vaccine.**  / 1 dose for all adults aged 47 years and older.  Meningococcal vaccine.** / Consult your health care provider.  Hepatitis A vaccine.** / Consult your health care provider.  Hepatitis B vaccine.** / Consult your health care provider.  Haemophilus influenzae type b (Hib) vaccine.** / Consult your health care provider. **Family history and personal history of risk and conditions may change your health care provider's recommendations. Document Released: 12/21/2001 Document Revised: 10/30/2013 Document Reviewed: 03/22/2011 [facility] Patient Information 2015 [place], [place]. This information is not intended to replace advice given to you by your health care provider. Make sure you discuss any questions you have with your health care provider.

## 2016-09-11 LAB — URINALYSIS, ROUTINE W REFLEX MICROSCOPIC
Bilirubin Urine: NEGATIVE
Glucose, UA: NEGATIVE
Hgb urine dipstick: NEGATIVE
Ketones, ur: NEGATIVE
Leukocytes, UA: NEGATIVE
Nitrite: NEGATIVE
Protein, ur: NEGATIVE
Specific Gravity, Urine: 1.022 (ref 1.001–1.035)
pH: 6.5 (ref 5.0–8.0)

## 2016-09-11 LAB — BASIC METABOLIC PANEL
BUN: 9 mg/dL (ref 7–25)
CO2: 28 mmol/L (ref 20–31)
Calcium: 9.4 mg/dL (ref 8.6–10.3)
Chloride: 102 mmol/L (ref 98–110)
Creat: 0.9 mg/dL (ref 0.70–1.25)
Glucose, Bld: 93 mg/dL (ref 65–99)
Potassium: 4 mmol/L (ref 3.5–5.3)
Sodium: 140 mmol/L (ref 135–146)

## 2016-09-11 LAB — LIPID PANEL
Cholesterol: 146 mg/dL (ref 125–200)
HDL: 42 mg/dL (ref >=40)
LDL Cholesterol: 92 mg/dL (ref <130)
Total CHOL/HDL Ratio: 3.5 Ratio (ref <=5.0)
Triglycerides: 58 mg/dL (ref <150)
VLDL: 12 mg/dL (ref <30)

## 2016-09-12 LAB — URINE CULTURE: Organism ID, Bacteria: NO GROWTH

## 2016-09-12 NOTE — Assessment & Plan Note (Signed)
HTN: Continue metoprolol, check a BMP, CBC and cholesterol B12 and vitamin D deficiencies: Recommend supplements, last time levels were checked were okay. Right abdomen/flank pain: Abdominal exam (-), location of the pain is somewhat atypical for abdomen pain or GI sx, will check a UA and recommend observation. RTC one year

## 2016-09-28 ENCOUNTER — Other Ambulatory Visit: Payer: Self-pay | Admitting: Internal Medicine

## 2016-09-28 MED ORDER — METOPROLOL SUCCINATE ER 25 MG PO TB24: 12.5000 mg | ORAL_TABLET | Freq: Every day | ORAL | 1 refills | Status: DC

## 2016-09-28 NOTE — Addendum Note (Signed)
Addended byDamita Dunnings D on: 09/28/2016 04:46 PM   Modules accepted: Orders

## 2016-11-08 HISTORY — PX: COLONOSCOPY: SHX174

## 2016-11-10 ENCOUNTER — Ambulatory Visit: Payer: Medicare Other | Admitting: *Deleted

## 2016-11-10 VITALS — Ht 72.0 in | Wt 326.2 lb

## 2016-11-10 DIAGNOSIS — Z800 Family history of malignant neoplasm of digestive organs: Secondary | ICD-10-CM

## 2016-11-10 MED ORDER — NA SULFATE-K SULFATE-MG SULF 17.5-3.13-1.6 GM/177ML PO SOLN: 1.0000 | Freq: Once | ORAL | 0 refills | Status: AC

## 2016-11-10 NOTE — Progress Notes (Signed)
Denies allergies to eggs or soy products. Denies complications with sedation or anesthesia. Denies O2 use. Denies use of diet or weight loss medications.  Emmi instructions given for colonoscopy.  

## 2016-11-16 ENCOUNTER — Encounter: Payer: Self-pay | Admitting: Gastroenterology

## 2016-11-23 ENCOUNTER — Telehealth: Payer: Self-pay | Admitting: Gastroenterology

## 2016-11-24 ENCOUNTER — Encounter: Payer: Medicare Other | Admitting: Gastroenterology

## 2016-11-30 ENCOUNTER — Ambulatory Visit (AMBULATORY_SURGERY_CENTER): Payer: Medicare Other | Admitting: Gastroenterology

## 2016-11-30 ENCOUNTER — Encounter: Payer: Self-pay | Admitting: Gastroenterology

## 2016-11-30 VITALS — BP 125/93 | HR 80 | Temp 98.2°F | Resp 23 | Ht 72.0 in | Wt 326.0 lb

## 2016-11-30 DIAGNOSIS — Z86010 Personal history of colonic polyps: Secondary | ICD-10-CM | POA: Diagnosis present

## 2016-11-30 DIAGNOSIS — D128 Benign neoplasm of rectum: Secondary | ICD-10-CM

## 2016-11-30 DIAGNOSIS — D122 Benign neoplasm of ascending colon: Secondary | ICD-10-CM | POA: Diagnosis not present

## 2016-11-30 DIAGNOSIS — K621 Rectal polyp: Secondary | ICD-10-CM

## 2016-11-30 DIAGNOSIS — Z1211 Encounter for screening for malignant neoplasm of colon: Secondary | ICD-10-CM | POA: Diagnosis not present

## 2016-11-30 MED ORDER — SODIUM CHLORIDE 0.9 % IV SOLN: 500.0000 mL | INTRAVENOUS | Status: DC

## 2016-11-30 NOTE — Op Note (Signed)
[facility] Patient Name: Cameron [name] Procedure Date: 11/30/2016 1:25 PM MRN: [id number] Endoscopist: Mallie Mussel L. Loletha Carrow , MD Age: 71 Referring MD:  Date of Birth: August 22, 1946 Gender: Male Account #: [id number] Procedure:                Colonoscopy Indications:              Surveillance: Personal history of adenomatous                            polyps on last colonoscopy > 5 years ago Medicines:                Monitored Anesthesia Care Procedure:                Pre-Anesthesia Assessment:                           - Prior to the procedure, a History and Physical                            was performed, and patient medications and                            allergies were reviewed. The patient's tolerance of                            previous anesthesia was also reviewed. The risks                            and benefits of the procedure and the sedation                            options and risks were discussed with the patient.                            All questions were answered, and informed consent                            was obtained. Prior Anticoagulants: The patient has                            taken no previous anticoagulant or antiplatelet                            agents. ASA Grade Assessment: III - A patient with                            severe systemic disease. After reviewing the risks                            and benefits, the patient was deemed in                            satisfactory condition to undergo the procedure.  After obtaining informed consent, the colonoscope                            was passed under direct vision. Throughout the                            procedure, the patient's blood pressure, pulse, and                            oxygen saturations were monitored continuously. The                            Model CF-HQ190L [id number] [id number]) scope was introduced                            through the anus  and advanced to the the cecum,                            identified by appendiceal orifice and ileocecal                            valve. The colonoscopy was performed without                            difficulty. The patient tolerated the procedure                            well. The quality of the bowel preparation was                            fair. The ileocecal valve, appendiceal orifice, and                            rectum were photographed. The quality of the bowel                            preparation was evaluated using the BBPS [facility]                            Bowel Preparation Scale) with scores of: Right                            Colon = 1, Transverse Colon = 2 and Left Colon = 2.                            The total BBPS score equals 5. The bowel                            preparation used was SUPREP. Scope In: 1:30:06 PM Scope Out: 1:49:30 PM Scope Withdrawal Time: 0 hours 15 minutes 27 seconds  Total Procedure Duration: 0 hours 19 minutes 24 seconds  Findings:                 The perianal  and digital rectal examinations were                            normal.                           Three sessile polyps were found in the rectum and                            mid ascending colon. The polyps were 2 to 4 mm in                            size. These polyps were removed with a cold snare.                            Resection and retrieval were complete.                           The sigmoid colon was moderately redundant.                           Multiple medium-mouthed diverticula were found in                            the left colon.                           Internal hemorrhoids were found during                            retroflexion. The hemorrhoids were medium-sized and                            Grade I (internal hemorrhoids that do not prolapse).                           The exam was otherwise without abnormality on                            direct  and retroflexion views. Complications:            No immediate complications. Estimated Blood Loss:     Estimated blood loss: none. Impression:               - Preparation of the colon was fair.                           - Three 2 to 4 mm polyps in the rectum and in the                            mid ascending colon, removed with a cold snare.                            Resected and retrieved.                           -  Redundant colon.                           - Diverticulosis in the left colon.                           - Internal hemorrhoids.                           - The examination was otherwise normal on direct                            and retroflexion views. Recommendation:           - Patient has a contact number available for                            emergencies. The signs and symptoms of potential                            delayed complications were discussed with the                            patient. Return to normal activities tomorrow.                            Written discharge instructions were provided to the                            patient.                           - Resume previous diet.                           - Continue present medications.                           - Await pathology results.                           - Repeat colonoscopy is recommended for                            surveillance. The colonoscopy date will be                            determined after pathology results from today's                            exam become available for review.                           Two-day prep fpr future colonoscopy. Jeromy Borcherding L. Loletha Carrow, MD 11/30/2016 1:56:21 PM This report has been signed electronically.

## 2016-11-30 NOTE — Progress Notes (Signed)
Patient awakening,vss,report to rn 

## 2016-11-30 NOTE — Patient Instructions (Signed)
YOU HAD AN ENDOSCOPIC PROCEDURE TODAY AT [facility]:   Refer to the procedure report that was given to you for any specific questions about what was found during the examination.  If the procedure report does not answer your questions, please call your gastroenterologist to clarify.  If you requested that your care partner not be given the details of your procedure findings, then the procedure report has been included in a sealed envelope for you to review at your convenience later.  YOU SHOULD EXPECT: Some feelings of bloating in the abdomen. Passage of more gas than usual.  Walking can help get rid of the air that was put into your GI tract during the procedure and reduce the bloating. If you had a lower endoscopy (such as a colonoscopy or flexible sigmoidoscopy) you may notice spotting of blood in your stool or on the toilet paper. If you underwent a bowel prep for your procedure, you may not have a normal bowel movement for a few days.  Please Note:  You might notice some irritation and congestion in your nose or some drainage.  This is from the oxygen used during your procedure.  There is no need for concern and it should clear up in a day or so.  SYMPTOMS TO REPORT IMMEDIATELY:   Following lower endoscopy (colonoscopy or flexible sigmoidoscopy):  Excessive amounts of blood in the stool  Significant tenderness or worsening of abdominal pains  Swelling of the abdomen that is new, acute  Fever of 100F or higher  For urgent or emergent issues, a gastroenterologist can be reached at any hour by calling [phone].   DIET:  We do recommend a small meal at first, but then you may proceed to your regular diet.  Drink plenty of fluids but you should avoid alcoholic beverages for 24 hours.  ACTIVITY:  You should plan to take it easy for the rest of today and you should NOT DRIVE or use heavy machinery until tomorrow (because of the sedation medicines used during the test).     FOLLOW UP: Our staff will call the number listed on your records the next business day following your procedure to check on you and address any questions or concerns that you may have regarding the information given to you following your procedure. If we do not reach you, we will leave a message.  However, if you are feeling well and you are not experiencing any problems, there is no need to return our call.  We will assume that you have returned to your regular daily activities without incident.  If any biopsies were taken you will be contacted by phone or by letter within the next 1-3 weeks.  Please call us at [phone] if you have not heard about the biopsies in 3 weeks.    SIGNATURES/CONFIDENTIALITY: You and/or your care partner have signed paperwork which will be entered into your electronic medical record.  These signatures attest to the fact that that the information above on your After Visit Summary has been reviewed and is understood.  Full responsibility of the confidentiality of this discharge information lies with you and/or your care-partner.  Polyp, diverticulosis, high fiber diet, and hemorrhoid information given.

## 2016-11-30 NOTE — Progress Notes (Signed)
Called to room to assist during endoscopic procedure.  Patient ID and intended procedure confirmed with present staff. Received instructions for my participation in the procedure from the performing physician.  

## 2016-12-01 ENCOUNTER — Telehealth: Payer: Self-pay | Admitting: *Deleted

## 2016-12-01 ENCOUNTER — Telehealth: Payer: Self-pay

## 2016-12-01 NOTE — Telephone Encounter (Signed)
  Follow up Call-  Call back number 11/30/2016  Post procedure Call Back phone  # [phone]  Permission to leave phone message Yes  Some recent data might be hidden     Patient questions:  Do you have a fever, pain , or abdominal swelling? No. Pain Score  0 *  Have you tolerated food without any problems? Yes.    Have you been able to return to your normal activities? Yes.    Do you have any questions about your discharge instructions: Diet   No. Medications  No. Follow up visit  No.  Do you have questions or concerns about your Care? No.  Actions: * If pain score is 4 or above: No action needed, pain <4.

## 2016-12-01 NOTE — Telephone Encounter (Signed)
No answer, message left for the patient. 

## 2016-12-05 DIAGNOSIS — G4733 Obstructive sleep apnea (adult) (pediatric): Secondary | ICD-10-CM | POA: Diagnosis not present

## 2016-12-07 ENCOUNTER — Encounter: Payer: Self-pay | Admitting: Gastroenterology

## 2017-01-05 DIAGNOSIS — G4733 Obstructive sleep apnea (adult) (pediatric): Secondary | ICD-10-CM | POA: Diagnosis not present

## 2017-01-31 DIAGNOSIS — G4733 Obstructive sleep apnea (adult) (pediatric): Secondary | ICD-10-CM | POA: Diagnosis not present

## 2017-02-02 DIAGNOSIS — G4733 Obstructive sleep apnea (adult) (pediatric): Secondary | ICD-10-CM | POA: Diagnosis not present

## 2017-02-03 DIAGNOSIS — H2513 Age-related nuclear cataract, bilateral: Secondary | ICD-10-CM | POA: Diagnosis not present

## 2017-02-03 DIAGNOSIS — H52221 Regular astigmatism, right eye: Secondary | ICD-10-CM | POA: Diagnosis not present

## 2017-02-03 DIAGNOSIS — H401131 Primary open-angle glaucoma, bilateral, mild stage: Secondary | ICD-10-CM | POA: Diagnosis not present

## 2017-02-03 DIAGNOSIS — H04123 Dry eye syndrome of bilateral lacrimal glands: Secondary | ICD-10-CM | POA: Diagnosis not present

## 2017-02-11 ENCOUNTER — Other Ambulatory Visit: Payer: Self-pay

## 2017-02-11 MED ORDER — METOPROLOL SUCCINATE ER 25 MG PO TB24: 12.5000 mg | ORAL_TABLET | Freq: Every day | ORAL | 2 refills | Status: DC

## 2017-02-15 NOTE — Telephone Encounter (Signed)
Done

## 2017-03-05 DIAGNOSIS — G4733 Obstructive sleep apnea (adult) (pediatric): Secondary | ICD-10-CM | POA: Diagnosis not present

## 2017-03-11 ENCOUNTER — Ambulatory Visit (INDEPENDENT_AMBULATORY_CARE_PROVIDER_SITE_OTHER): Payer: Medicare Other | Admitting: Internal Medicine

## 2017-03-11 ENCOUNTER — Encounter: Payer: Self-pay | Admitting: Internal Medicine

## 2017-03-11 VITALS — BP 132/80 | HR 70 | Temp 98.2°F | Resp 14 | Ht 72.0 in | Wt 320.4 lb

## 2017-03-11 DIAGNOSIS — J069 Acute upper respiratory infection, unspecified: Secondary | ICD-10-CM

## 2017-03-11 DIAGNOSIS — H6120 Impacted cerumen, unspecified ear: Secondary | ICD-10-CM | POA: Diagnosis not present

## 2017-03-11 MED ORDER — AZELASTINE HCL 0.1 % NA SOLN: 2.0000 | Freq: Every evening | NASAL | 3 refills | Status: DC | PRN

## 2017-03-11 NOTE — Progress Notes (Signed)
Subjective:    Patient ID: Cameron [name], male    DOB: 20-Apr-1946, 71 y.o.   MRN: [id number]  DOS:  03/11/2017 Type of visit - description : acute Interval history: 2 day history of nasal congestion, nasal discharge. Some cough with clear sputum production. Symptoms started after a bike ride, he wonders about allergies due to pollen.   Review of Systems No fever chills No nausea or vomiting No malaise but he felt slightly tired after he took Zyrtec. No sneezing. He did have some discomfort at the left ear "like a need to pop it" No wheezing  Past Medical History:  Diagnosis Date  . Arthritis    degenerative joint disease, knees   . Constipation due to pain medication   . Diverticulosis of colon   . Fatty liver   . GERD (gastroesophageal reflux disease)    takes Prevacid  . Glaucoma (increased eye pressure)   . HTN (hypertension)   . Iron deficiency anemia    h/o  . Keloid of skin   . Lumbar spondylosis    possible right sided radiculopathy with hx of surgery by Dr. Cyndy Freeze in 2003  . Osteopenia    DEXA 12-2006    . Personal history of colonic polyps 2002   TUBULAR ADENOMA  . Prostatitis, acute 04-2011   admitted to [facility]  . Sleep apnea    on CPAP, last study [facility]  . Tick bite     Past Surgical History:  Procedure Laterality Date  . BACK SURGERY  2003  . CHOLECYSTECTOMY  2003  . COLONOSCOPY    . LAPAROSCOPIC APPENDECTOMY N/A 01/16/2016   Procedure: APPENDECTOMY LAPAROSCOPIC;  Surgeon: Fanny Skates, MD;  Location: [facility];  Service: General;  Laterality: N/A;  . LUMBAR LAMINECTOMY/DECOMPRESSION MICRODISCECTOMY N/A 08/22/2015   Procedure: Lumbar two to lumbar three lumbar three to lumbar four microdiskectomy;  Surgeon: Ashok Pall, MD;  Location: [facility];  Service: Neurosurgery;  Laterality: N/A;  L23 L34 microdiskectomy  . TOTAL KNEE ARTHROPLASTY Right 06/11/2014   Procedure: TOTAL KNEE ARTHROPLASTY;  Surgeon: Hessie Dibble, MD;  Location: [facility];   Service: Orthopedics;  Laterality: Right;  . TOTAL KNEE ARTHROPLASTY Left 10/08/2014   DR DALLDORF  . TOTAL KNEE ARTHROPLASTY Left 10/08/2014   Procedure: TOTAL LEFT KNEE ARTHROPLASTY;  Surgeon: Hessie Dibble, MD;  Location: [facility];  Service: Orthopedics;  Laterality: Left;    Social History   Social History  . Marital status: Married    Spouse name: N/A  . Number of children: 2  . Years of education: N/A   Occupational History  . retired--works in HR    Social History Main Topics  . Smoking status: Never Smoker  . Smokeless tobacco: Never Used  . Alcohol use Yes     Comment: socially  . Drug use: No  . Sexual activity: Not on file   Other Topics Concern  . Not on file   Social History Narrative    plays tennis sometimes       Allergies as of 03/11/2017      Reactions   Chlorhexidine Gluconate Hives, Other (See Comments)   Red rash developed on upper chest   Ciprofloxacin Hcl Itching   Clindamycin Itching, Rash      Medication List       Accurate as of 03/11/17 11:59 PM. Always use your most recent med list.          aspirin 81 MG tablet Take 81  mg by mouth daily.   azelastine 0.1 % nasal spray Commonly known as:  ASTELIN Place 2 sprays into both nostrils at bedtime as needed for rhinitis. Use in each nostril as directed   folic acid 1 MG tablet Commonly known as:  FOLVITE Take 1 tablet (1 mg total) by mouth daily.   latanoprost 0.005 % ophthalmic solution Commonly known as:  XALATAN Place 1 drop into both eyes at bedtime.   metoprolol succinate 25 MG 24 hr tablet Commonly known as:  TOPROL-XL Take 0.5 tablets (12.5 mg total) by mouth daily.   multivitamin with minerals Tabs tablet Take 1 tablet by mouth daily.   MYRBETRIQ 50 MG Tb24 tablet Generic drug:  mirabegron ER Take 50 mg by mouth daily.   timolol 0.5 % ophthalmic solution Commonly known as:  BETIMOL Place 1 drop into both eyes daily. Reported on 01/16/2016   vitamin C 1000 MG  tablet Take 1,000 mg by mouth 2 (two) times daily.          Objective:   Physical Exam BP 132/80 (BP Location: Left Arm, Patient Position: Sitting, Cuff Size: Normal)   Pulse 70   Temp 98.2 F (36.8 C) (Oral)   Resp 14   Ht 6' (1.829 m)   Wt (!) 320 lb 6 oz (145.3 kg)   SpO2 97%   BMI 43.45 kg/m  General:   Well developed, well nourished . NAD.  HEENT:  Normocephalic . Face symmetric, atraumatic. Large amount of wax on the left, some on the right. They were removed with a spoon by me. TMs look normal. Nose quite congested Throat symmetric , not red Lungs:  CTA B Normal respiratory effort, no intercostal retractions, no accessory muscle use. Heart: RRR,  no murmur.  No pretibial edema bilaterally  Skin: Not pale. Not jaundice Neurologic:  alert & oriented X3.  Speech normal, gait appropriate for age and unassisted Psych--  Cognition and judgment appear intact.  Cooperative with normal attention span and concentration.  Behavior appropriate. No anxious or depressed appearing.      Assessment & Plan:    Assessment Hypertension OSA on CPAP GI: GERD, Fatty liver B12 and vit D def RAD (occ wheezing w/ URIs, inhalers) DJD: lumbar spondylosis, back surgery 2003, knee replacements GU:--LUTS  Dr Venia Minks ([facility]) --h/o prostatitis admitted 2012 [facility] Glaucoma Osteopenia per DEXA 2008, multiple attempts to redo bone density test pale.  h/o iron deficiency   PLAN:   URI versus allergies: Upper respiratory symptoms as described above, URI versus allergies. Recommend supportive treatment with Mucinex, Flonase, Astelin, Zyrtec. Also saline nasal rinse. Call if no better. Cerumen impaction: Remove by spoon, okay to use H2 O2 as needed.

## 2017-03-11 NOTE — Patient Instructions (Signed)
If cough:  Take Mucinex DM twice a day as needed until better  For nasal congestion: Use OTC Nasocort or Flonase : 2 nasal sprays on each side of the nose in the morning until you feel better Use ASTELIN a prescribed spray : 2 nasal sprays on each side of the nose at night until you feel better  Okay Zyrtec up to 10 mg a day, avoid Zyrtec  "D"  Call if not gradually better over the next  10 days  Call anytime if the symptoms are severe

## 2017-03-11 NOTE — Progress Notes (Signed)
Pre visit review using our clinic review tool, if applicable. No additional management support is needed unless otherwise documented below in the visit note. 

## 2017-03-12 NOTE — Assessment & Plan Note (Signed)
URI versus allergies: Upper respiratory symptoms as described above, URI versus allergies. Recommend supportive treatment with Mucinex, Flonase, Astelin, Zyrtec. Also saline nasal rinse. Call if no better. Cerumen impaction: Remove by spoon, okay to use H2 O2 as needed.

## 2017-03-28 DIAGNOSIS — H04123 Dry eye syndrome of bilateral lacrimal glands: Secondary | ICD-10-CM | POA: Diagnosis not present

## 2017-03-28 DIAGNOSIS — H401131 Primary open-angle glaucoma, bilateral, mild stage: Secondary | ICD-10-CM | POA: Diagnosis not present

## 2017-03-28 DIAGNOSIS — H2513 Age-related nuclear cataract, bilateral: Secondary | ICD-10-CM | POA: Diagnosis not present

## 2017-04-03 ENCOUNTER — Other Ambulatory Visit: Payer: Self-pay | Admitting: Internal Medicine

## 2017-04-04 DIAGNOSIS — G4733 Obstructive sleep apnea (adult) (pediatric): Secondary | ICD-10-CM | POA: Diagnosis not present

## 2017-05-05 DIAGNOSIS — G4733 Obstructive sleep apnea (adult) (pediatric): Secondary | ICD-10-CM | POA: Diagnosis not present

## 2017-05-06 DIAGNOSIS — G4733 Obstructive sleep apnea (adult) (pediatric): Secondary | ICD-10-CM | POA: Diagnosis not present

## 2017-06-04 DIAGNOSIS — G4733 Obstructive sleep apnea (adult) (pediatric): Secondary | ICD-10-CM | POA: Diagnosis not present

## 2017-07-20 DIAGNOSIS — N3281 Overactive bladder: Secondary | ICD-10-CM | POA: Diagnosis not present

## 2017-07-20 DIAGNOSIS — N400 Enlarged prostate without lower urinary tract symptoms: Secondary | ICD-10-CM | POA: Diagnosis not present

## 2017-08-01 DIAGNOSIS — H02832 Dermatochalasis of right lower eyelid: Secondary | ICD-10-CM | POA: Diagnosis not present

## 2017-08-01 DIAGNOSIS — H401131 Primary open-angle glaucoma, bilateral, mild stage: Secondary | ICD-10-CM | POA: Diagnosis not present

## 2017-08-01 DIAGNOSIS — H2513 Age-related nuclear cataract, bilateral: Secondary | ICD-10-CM | POA: Diagnosis not present

## 2017-08-01 DIAGNOSIS — H04123 Dry eye syndrome of bilateral lacrimal glands: Secondary | ICD-10-CM | POA: Diagnosis not present

## 2017-08-01 DIAGNOSIS — H02831 Dermatochalasis of right upper eyelid: Secondary | ICD-10-CM | POA: Diagnosis not present

## 2017-08-15 DIAGNOSIS — G4733 Obstructive sleep apnea (adult) (pediatric): Secondary | ICD-10-CM | POA: Diagnosis not present

## 2017-08-16 ENCOUNTER — Telehealth: Payer: Self-pay | Admitting: *Deleted

## 2017-08-16 NOTE — Telephone Encounter (Signed)
Received Physician Orders from [facility] for CPAP supplies; forwarded to provider/SLS 10/09

## 2017-08-17 NOTE — Telephone Encounter (Signed)
Orders signed and faxed to [facility] at [phone]. Form sent for scanning.

## 2017-09-08 ENCOUNTER — Ambulatory Visit: Payer: Medicare Other | Admitting: Pulmonary Disease

## 2017-09-14 ENCOUNTER — Encounter: Payer: Self-pay | Admitting: Internal Medicine

## 2017-09-14 ENCOUNTER — Ambulatory Visit (INDEPENDENT_AMBULATORY_CARE_PROVIDER_SITE_OTHER): Payer: Medicare Other | Admitting: Internal Medicine

## 2017-09-14 VITALS — BP 142/78 | HR 68 | Temp 98.2°F | Resp 14 | Ht 72.0 in | Wt 321.4 lb

## 2017-09-14 DIAGNOSIS — Z0000 Encounter for general adult medical examination without abnormal findings: Secondary | ICD-10-CM | POA: Diagnosis not present

## 2017-09-14 LAB — LIPID PANEL
Cholesterol: 155 mg/dL (ref 0–200)
HDL: 42.9 mg/dL (ref >39.00)
LDL Cholesterol: 102 mg/dL — ABNORMAL HIGH (ref 0–99)
NonHDL: 111.63
Total CHOL/HDL Ratio: 4
Triglycerides: 50 mg/dL (ref 0.0–149.0)
VLDL: 10 mg/dL (ref 0.0–40.0)

## 2017-09-14 LAB — COMPREHENSIVE METABOLIC PANEL
ALT: 17 U/L (ref 0–53)
AST: 18 U/L (ref 0–37)
Albumin: 4 g/dL (ref 3.5–5.2)
Alkaline Phosphatase: 91 U/L (ref 39–117)
BUN: 14 mg/dL (ref 6–23)
CO2: 32 mEq/L (ref 19–32)
Calcium: 9.2 mg/dL (ref 8.4–10.5)
Chloride: 104 mEq/L (ref 96–112)
Creatinine, Ser: 0.88 mg/dL (ref 0.40–1.50)
GFR: 110.44 mL/min (ref >60.00)
Glucose, Bld: 112 mg/dL — ABNORMAL HIGH (ref 70–99)
Potassium: 3.8 mEq/L (ref 3.5–5.1)
Sodium: 141 mEq/L (ref 135–145)
Total Bilirubin: 0.8 mg/dL (ref 0.2–1.2)
Total Protein: 6.9 g/dL (ref 6.0–8.3)

## 2017-09-14 LAB — CBC WITH DIFFERENTIAL/PLATELET
Basophils Absolute: 0 10*3/uL (ref 0.0–0.1)
Basophils Relative: 0.4 % (ref 0.0–3.0)
Eosinophils Absolute: 0.2 10*3/uL (ref 0.0–0.7)
Eosinophils Relative: 4.9 % (ref 0.0–5.0)
HCT: 41.5 % (ref 39.0–52.0)
Hemoglobin: 13.8 g/dL (ref 13.0–17.0)
Lymphocytes Relative: 31.8 % (ref 12.0–46.0)
Lymphs Abs: 1.3 10*3/uL (ref 0.7–4.0)
MCHC: 33.2 g/dL (ref 30.0–36.0)
MCV: 90.3 fl (ref 78.0–100.0)
Monocytes Absolute: 0.6 10*3/uL (ref 0.1–1.0)
Monocytes Relative: 15 % — ABNORMAL HIGH (ref 3.0–12.0)
Neutro Abs: 2 10*3/uL (ref 1.4–7.7)
Neutrophils Relative %: 47.9 % (ref 43.0–77.0)
Platelets: 196 10*3/uL (ref 150.0–400.0)
RBC: 4.6 Mil/uL (ref 4.22–5.81)
RDW: 13.9 % (ref 11.5–15.5)
WBC: 4.2 10*3/uL (ref 4.0–10.5)

## 2017-09-14 LAB — TSH: TSH: 1.32 u[IU]/mL (ref 0.35–4.50)

## 2017-09-14 LAB — HEMOGLOBIN A1C: Hgb A1c MFr Bld: 5.8 % (ref 4.6–6.5)

## 2017-09-14 NOTE — Assessment & Plan Note (Addendum)
-  Td 2017; zostavax 2014; pnm shot --2015; prevnar 09-2016. Had a Flu shot; shingrix discussed  -CCS: Colonoscopy: 05/08/2004, neg--no polyps  Colonoscopy: 12-2009-- tics Cscope again 05-03-11, polyp Cscope 11-2016, next per GI -Prostate ca screening:  PSA per urology  -He is retired, remains active playing tennis weekly -Labs: CMP, FLP, CBC, A1c, TSH

## 2017-09-14 NOTE — Assessment & Plan Note (Signed)
HTN: BP slightly elevated today, continue metoprolol, recommend ambulatory BPs. OSA: Good CPAP compliance H/o  B12 and vitamin D deficiency, on no supplements, last levels very good. RTC 1 year.

## 2017-09-14 NOTE — Patient Instructions (Signed)
GO TO THE LAB : Get the blood work     GO TO THE FRONT DESK Schedule your next appointment for a  Physical exam in 1 year   Check the  blood pressure 2 or 3 times a month   Be sure your blood pressure is between 110/65 and  130/85. If it is consistently higher or lower, let me know Large cuff After rest for 10 minutes

## 2017-09-14 NOTE — Progress Notes (Signed)
Pre visit review using our clinic review tool, if applicable. No additional management support is needed unless otherwise documented below in the visit note. 

## 2017-09-14 NOTE — Progress Notes (Signed)
Subjective:    Patient ID: Cameron [name], male    DOB: 05-Jan-1946, 71 y.o.   MRN: [id number]  DOS:  09/14/2017 Type of visit - description : cpx Interval history: No major concerns, feeling well.  Review of Systems Essentially asymptomatic, does have LUTS, follow-up by urology  Other than above, a 14 point review of systems is negative     Past Medical History:  Diagnosis Date  . Arthritis    degenerative joint disease, knees   . Constipation due to pain medication   . Diverticulosis of colon   . Fatty liver   . GERD (gastroesophageal reflux disease)    takes Prevacid  . Glaucoma (increased eye pressure)   . HTN (hypertension)   . Iron deficiency anemia    h/o  . Keloid of skin   . Lumbar spondylosis    possible right sided radiculopathy with hx of surgery by Dr. Cyndy Freeze in 2003  . Osteopenia    DEXA 12-2006    . Personal history of colonic polyps 2002   TUBULAR ADENOMA  . Prostatitis, acute 04-2011   admitted to [facility]  . Sleep apnea    on CPAP, last study [facility]  . Tick bite     Past Surgical History:  Procedure Laterality Date  . BACK SURGERY  2003  . CHOLECYSTECTOMY  2003  . COLONOSCOPY    . TOTAL KNEE ARTHROPLASTY Left 10/08/2014   DR DALLDORF    Social History   Socioeconomic History  . Marital status: Married    Spouse name: Not on file  . Number of children: 2  . Years of education: Not on file  . Highest education level: Not on file  Social Needs  . Financial resource strain: Not on file  . Food insecurity - worry: Not on file  . Food insecurity - inability: Not on file  . Transportation needs - medical: Not on file  . Transportation needs - non-medical: Not on file  Occupational History  . Occupation: retired--works in [place] Use  . Smoking status: Never Smoker  . Smokeless tobacco: Never Used  Substance and Sexual Activity  . Alcohol use: Yes    Comment: socially  . Drug use: No  . Sexual activity: Not on file  Other Topics  Concern  . Not on file  Social History Narrative   Household- pt and wife   plays tennis sometimes      Family History  Problem Relation Age of Onset  . Dementia Mother   . Diverticulitis Father   . Colon cancer Maternal Uncle        dx age 35s  . Colon cancer Other        grandfather, dx age 43  . Stroke Other        maternal aunt in her 60s  . Breast cancer Maternal Aunt   . Prostate cancer Neg Hx   . Heart attack Neg Hx   . Hypertension Neg Hx   . Diabetes Neg Hx      Allergies as of 09/14/2017      Reactions   Chlorhexidine Gluconate Hives, Other (See Comments)   Red rash developed on upper chest   Ciprofloxacin Hcl Itching   Clindamycin Itching, Rash      Medication List        Accurate as of 09/14/17  4:53 PM. Always use your most recent med list.          aspirin 81 MG  tablet Take 81 mg by mouth daily.   folic acid 1 MG tablet Commonly known as:  FOLVITE Take 1 tablet (1 mg total) by mouth daily.   latanoprost 0.005 % ophthalmic solution Commonly known as:  XALATAN Place 1 drop into both eyes at bedtime.   metoprolol succinate 25 MG 24 hr tablet Commonly known as:  TOPROL-XL Take 0.5 tablets (12.5 mg total) by mouth daily.   multivitamin with minerals Tabs tablet Take 1 tablet by mouth daily.   MYRBETRIQ 50 MG Tb24 tablet Generic drug:  mirabegron ER Take 50 mg by mouth daily.   timolol 0.5 % ophthalmic solution Commonly known as:  BETIMOL Place 1 drop into both eyes daily. Reported on 01/16/2016   vitamin C 1000 MG tablet Take 1,000 mg by mouth 2 (two) times daily.          Objective:   Physical Exam BP (!) 142/78 (BP Location: Left Arm, Patient Position: Sitting, Cuff Size: Normal)   Pulse 68   Temp 98.2 F (36.8 C) (Oral)   Resp 14   Ht 6' (1.829 m)   Wt (!) 321 lb 6 oz (145.8 kg)   SpO2 93%   BMI 43.59 kg/m  General:   Well developed, overweight appearing,  NAD.  Neck: No  thyromegaly  HEENT:  Normocephalic . Face  symmetric, atraumatic Lungs:  CTA B Normal respiratory effort, no intercostal retractions, no accessory muscle use. Heart: RRR,  no murmur.  No pretibial edema bilaterally  Abdomen:  Not distended, soft, non-tender. No rebound or rigidity.   Skin: Exposed areas without rash. Not pale. Not jaundice Neurologic:  alert & oriented X3.  Speech normal, gait appropriate for age and unassisted Strength symmetric and appropriate for age.  Psych: Cognition and judgment appear intact.  Cooperative with normal attention span and concentration.  Behavior appropriate. No anxious or depressed appearing.     Assessment & Plan:   Assessment Hypertension OSA on CPAP GI: GERD, Fatty liver RAD (occ wheezing w/ URIs, inhalers) DJD: lumbar spondylosis, back surgery 2003, knee replacements GU:--LUTS  Dr Venia Minks ([facility]) --h/o prostatitis admitted 2012 [facility] Glaucoma Osteopenia per DEXA 2008, multiple attempts to redo bone density test failed.  h/o iron deficiency  H/o  B12 and vit D def, 2016 levels wnl  PLAN:   HTN: BP slightly elevated today, continue metoprolol, recommend ambulatory BPs. OSA: Good CPAP compliance H/o  B12 and vitamin D deficiency, on no supplements, last levels very good. RTC 1 year.

## 2017-09-15 ENCOUNTER — Ambulatory Visit: Payer: Medicare Other | Admitting: Pulmonary Disease

## 2017-09-26 ENCOUNTER — Encounter: Payer: Self-pay | Admitting: Pulmonary Disease

## 2017-09-26 ENCOUNTER — Ambulatory Visit: Payer: Medicare Other | Admitting: Pulmonary Disease

## 2017-09-26 DIAGNOSIS — Z9989 Dependence on other enabling machines and devices: Secondary | ICD-10-CM | POA: Diagnosis not present

## 2017-09-26 DIAGNOSIS — I10 Essential (primary) hypertension: Secondary | ICD-10-CM | POA: Diagnosis not present

## 2017-09-26 DIAGNOSIS — G4733 Obstructive sleep apnea (adult) (pediatric): Secondary | ICD-10-CM | POA: Diagnosis not present

## 2017-09-26 NOTE — Assessment & Plan Note (Signed)
Change to auto settings 5-15 cm. CPAP supplies will be renewed for a year CPAP was clearly helped improve his daytime somnolence, fatigue and quality of life and he is very compliant  Weight loss encouraged, compliance with goal of at least 4-6 hrs every night is the expectation. Advised against medications with sedative side effects Cautioned against driving when sleepy - understanding that sleepiness will vary on a day to day basis

## 2017-09-26 NOTE — Assessment & Plan Note (Signed)
Well-controlled on one medication

## 2017-09-26 NOTE — Patient Instructions (Signed)
Change to auto settings 5-15 cm. CPAP supplies will be renewed for a year

## 2017-09-26 NOTE — Addendum Note (Signed)
Addended by: Valerie Salts on: 09/26/2017 10:18 AM   Modules accepted: Orders

## 2017-09-26 NOTE — Progress Notes (Signed)
   Subjective:    Patient ID: Cameron [name], male    DOB: 05-Feb-1946, 71 y.o.   MRN: [id number]  HPI 71 year old obese man for follow-up of OSA  OSA was diagnosed in 1996 with PSG showing severe degree with AHI 87/hour. He was maintained on CPAP at 20 cm with nasal mask for many years and then switched to nasal pillows, pressure was dropped to 18 cm. He had good results with improvement in daytime somnolence and snoring  He obtain a new machine in 2017 and really allows it.  He continues to use nasal pillows and good compliance.  Denies any problems with mask or pressure.  He has a soclean machine which he uses religiously CPAP download was reviewed which shows excellent compliance more than 7 hours per night with average pressure of 13 cm on auto settings 5-20 cm and minimal leak His weight is unchanged from last year   Significant tests/ events  NPSG 2001:  AHI 87/hr   Review of Systems Patient denies significant dyspnea,cough, hemoptysis,  chest pain, palpitations, pedal edema, orthopnea, paroxysmal nocturnal dyspnea, lightheadedness, nausea, vomiting, abdominal or  leg pains      Objective:   Physical Exam   Gen. Pleasant, obese, in no distress ENT - no lesions, no post nasal drip Neck: No JVD, no thyromegaly, no carotid bruits Lungs: no use of accessory muscles, no dullness to percussion, decreased without rales or rhonchi  Cardiovascular: Rhythm regular, heart sounds  normal, no murmurs or gallops, no peripheral edema Musculoskeletal: No deformities, no cyanosis or clubbing , no tremors        Assessment & Plan:

## 2017-10-01 IMAGING — CT CT ABD-PELV W/ CM
2 of 5 series · 16 of 46 positions shown, 18 images · IV contrast (omnipaque)
Comparison: None available.

CLINICAL DATA: Initial evaluation for acute right-sided abdominal
pain with nausea.

EXAM:
CT ABDOMEN AND PELVIS WITH CONTRAST
TECHNIQUE: Multidetector CT imaging of the abdomen and pelvis was performed
using the standard protocol following bolus administration of
intravenous contrast.
CONTRAST:  100mL OMNIPAQUE IOHEXOL 300 MG/ML SOLN, 50mL OMNIPAQUE
IOHEXOL 300 MG/ML SOLN

[Series 2: axial st · axial · 0.95mm/px · z∈[+700,+1175]mm · 13 of 107 slices shown, 15 images]
[im 6/107  soft-tissue]
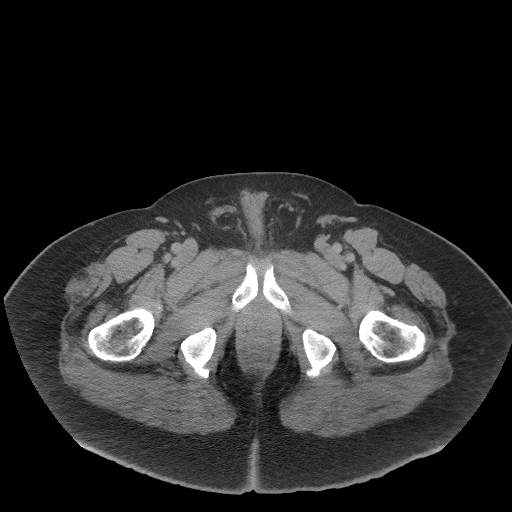
[im 6/107  bone]
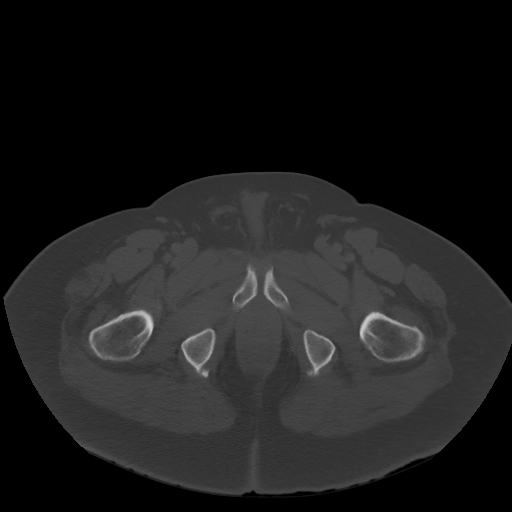
[im 12/107  soft-tissue]
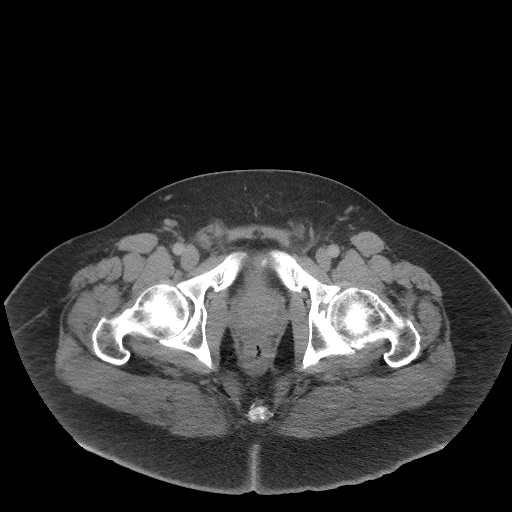
[im 24/107  soft-tissue]
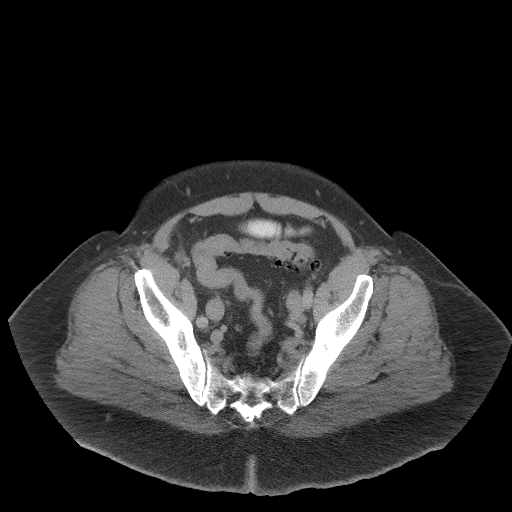
[im 30/107  soft-tissue]
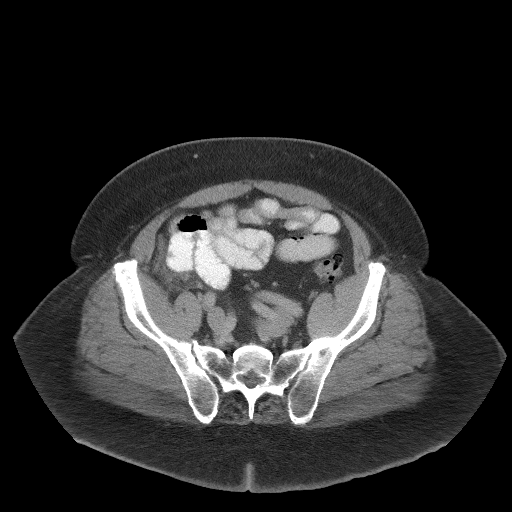
[im 36/107  soft-tissue]
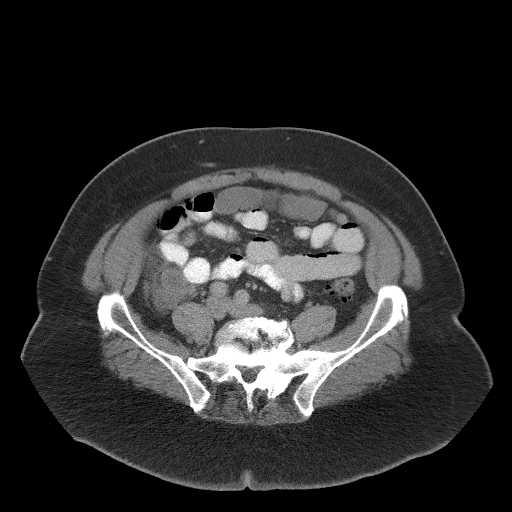
[im 48/107  soft-tissue]
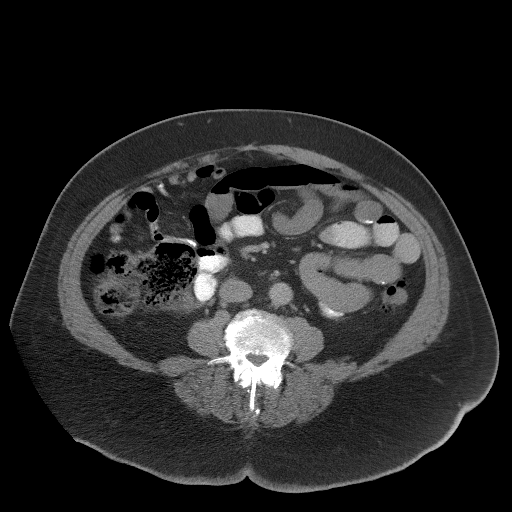
[im 54/107  soft-tissue]
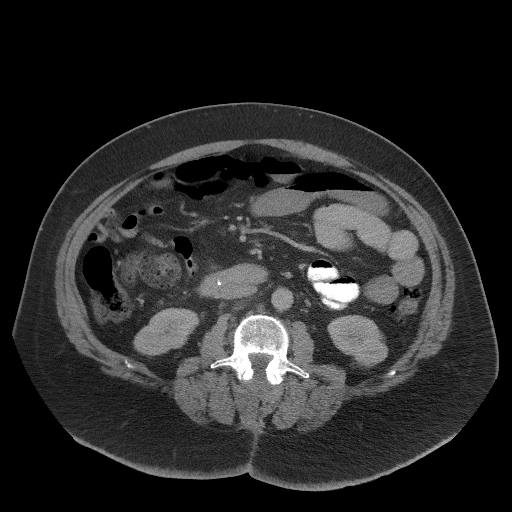
[im 59/107  soft-tissue]
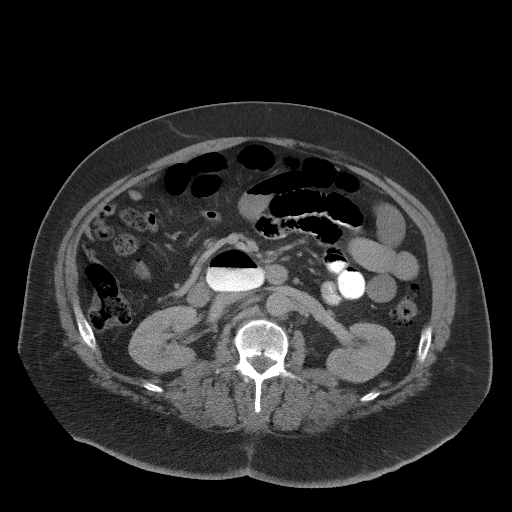
[im 71/107  soft-tissue]
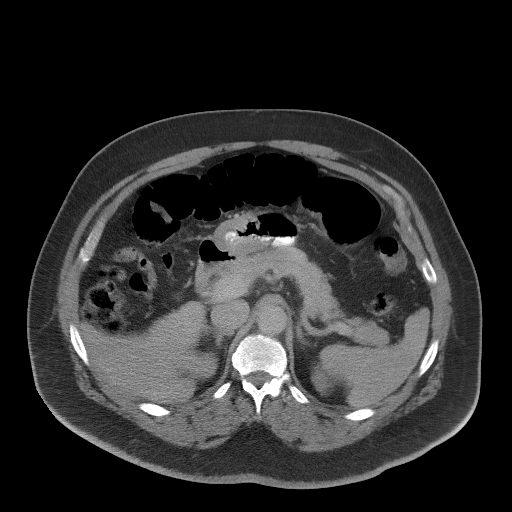
[im 71/107  bone]
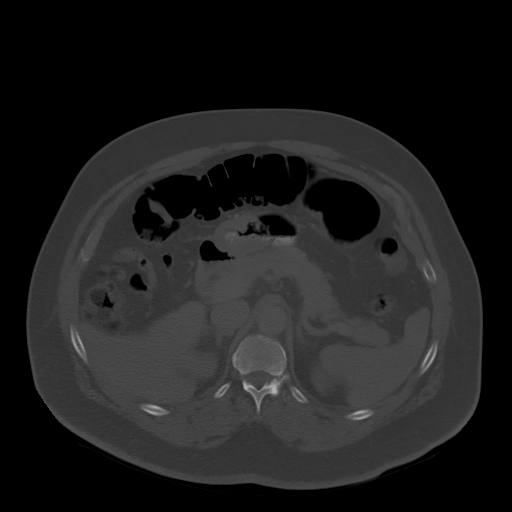
[im 77/107  soft-tissue]
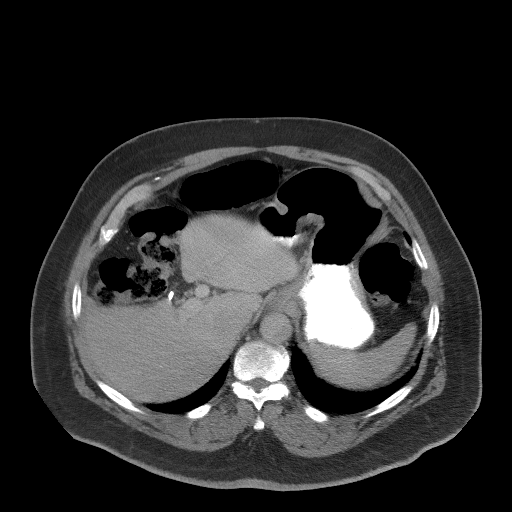
[im 83/107  soft-tissue]
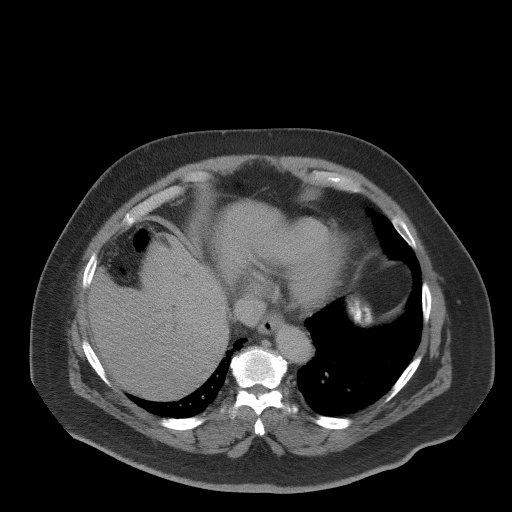
[im 95/107  soft-tissue]
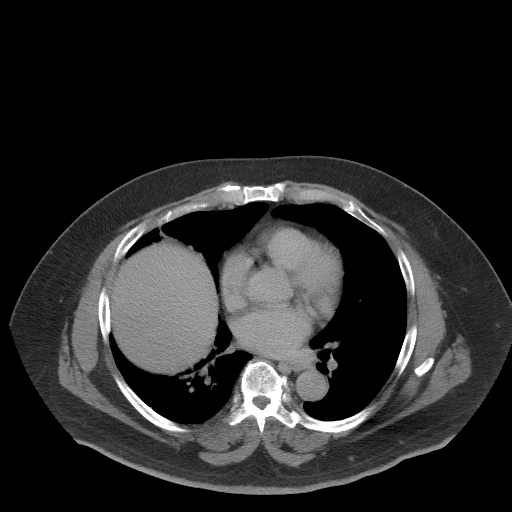
[im 101/107  soft-tissue]
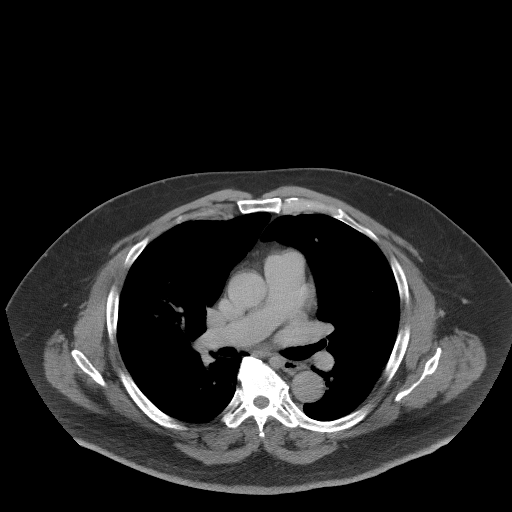

[Series 5: coronal st · coronal · 0.89mm/px · 3 of 127 slices shown]
[im 43/127  soft-tissue]
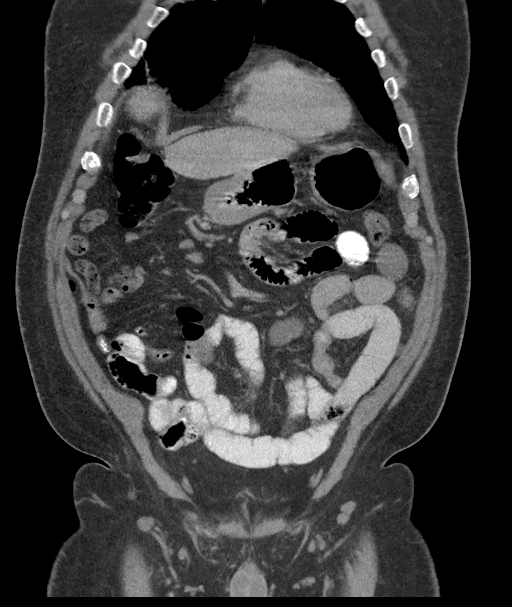
[im 57/127  soft-tissue]
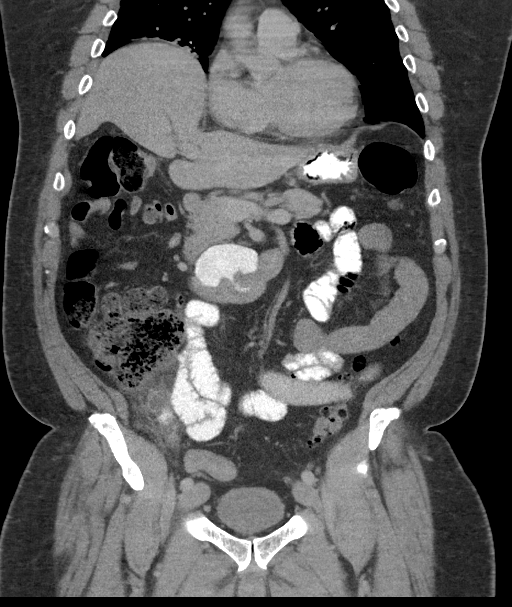
[im 71/127  soft-tissue]
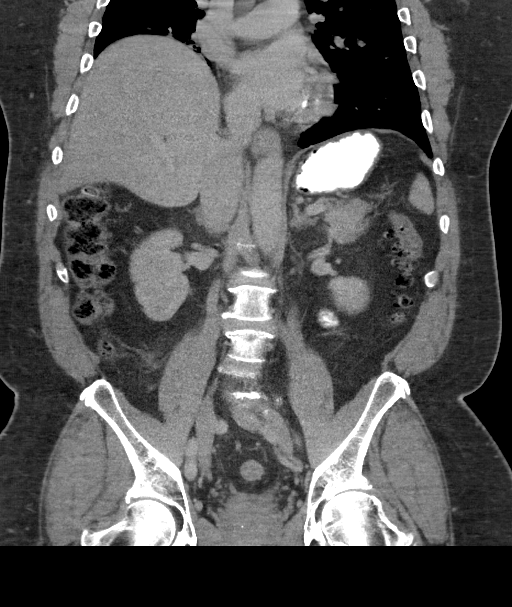

[16 of 46 positions shown; findings below may reference images not displayed]

FINDINGS: Mild scattered atelectatic changes noted within the visualized lung
bases. The visualized lungs are otherwise clear.

Liver within normal limits. Gallbladder absent. No biliary
dilatation. Spleen, adrenal glands, and pancreas demonstrate a
normal contrast enhanced appearance.

Kidneys are equal in size with symmetric enhancement. Punctate 3 mm
nonobstructive left renal calculus. No hydronephrosis. No focal
enhancing renal mass.

Mild gastric wall thickening present near the gastric antrum/
pylorus noted, most likely related to incomplete distension. Stomach
otherwise unremarkable. No evidence for small bowel obstruction.

There is extensive inflammatory changes about the dilated appendix
in the right lower quadrant which measures up to 1.9 cm. No definite
frank periappendiceal abscess. No free air to suggest perforation. B
more distal aspect of the appendix is relatively normal in
appearance without significant inflammatory changes.

Colon is normal in appearance. Sigmoid diverticulosis without
evidence for acute diverticulitis.

Bladder decompressed but grossly normal. Prostate mildly enlarged
measuring 5.5 cm in diameter.

No free air. Small amount of free fluid along the right pericolic
gutter related to the inflamed appendix. No pathologically enlarged
intra-abdominal or pelvic lymph nodes.

No acute osseous abnormality. Postoperative changes noted within the
lumbar spine. Multilevel degenerative changes present. No worrisome
lytic or blastic osseous lesions.
IMPRESSION: 1. Findings consistent with acute appendicitis. Significant
periappendiceal inflammatory stranding without frank periappendiceal
abscess or evidence for perforation.
2. Sigmoid diverticulosis without evidence for acute diverticulitis.
3. 3 mm nonobstructive left renal calculus.

## 2017-10-17 ENCOUNTER — Other Ambulatory Visit: Payer: Self-pay | Admitting: Internal Medicine

## 2017-10-30 ENCOUNTER — Encounter (HOSPITAL_BASED_OUTPATIENT_CLINIC_OR_DEPARTMENT_OTHER): Payer: Self-pay

## 2017-10-30 ENCOUNTER — Emergency Department (HOSPITAL_BASED_OUTPATIENT_CLINIC_OR_DEPARTMENT_OTHER): Payer: Medicare Other

## 2017-10-30 ENCOUNTER — Emergency Department (HOSPITAL_BASED_OUTPATIENT_CLINIC_OR_DEPARTMENT_OTHER)
Admission: EM | Admit: 2017-10-30 | Discharge: 2017-10-30 | Disposition: A | Discharge status: Home / Self Care | Payer: Medicare Other | Attending: Emergency Medicine | Admitting: Emergency Medicine

## 2017-10-30 ENCOUNTER — Other Ambulatory Visit: Payer: Self-pay

## 2017-10-30 DIAGNOSIS — R05 Cough: Secondary | ICD-10-CM

## 2017-10-30 DIAGNOSIS — R062 Wheezing: Secondary | ICD-10-CM | POA: Diagnosis not present

## 2017-10-30 DIAGNOSIS — I10 Essential (primary) hypertension: Secondary | ICD-10-CM | POA: Insufficient documentation

## 2017-10-30 DIAGNOSIS — J189 Pneumonia, unspecified organism: Secondary | ICD-10-CM | POA: Insufficient documentation

## 2017-10-30 DIAGNOSIS — R059 Cough, unspecified: Secondary | ICD-10-CM

## 2017-10-30 DIAGNOSIS — D509 Iron deficiency anemia, unspecified: Secondary | ICD-10-CM | POA: Diagnosis not present

## 2017-10-30 LAB — CBC WITH DIFFERENTIAL/PLATELET
Basophils Absolute: 0 10*3/uL (ref 0.0–0.1)
Basophils Relative: 1 %
Eosinophils Absolute: 0.3 10*3/uL (ref 0.0–0.7)
Eosinophils Relative: 9 %
HCT: 40.8 % (ref 39.0–52.0)
Hemoglobin: 13.5 g/dL (ref 13.0–17.0)
Lymphocytes Relative: 35 %
Lymphs Abs: 1.3 10*3/uL (ref 0.7–4.0)
MCH: 29.9 pg (ref 26.0–34.0)
MCHC: 33.1 g/dL (ref 30.0–36.0)
MCV: 90.3 fL (ref 78.0–100.0)
Monocytes Absolute: 0.7 10*3/uL (ref 0.1–1.0)
Monocytes Relative: 22 %
Neutro Abs: 1.1 10*3/uL — ABNORMAL LOW (ref 1.7–7.7)
Neutrophils Relative %: 33 %
Platelets: 187 10*3/uL (ref 150–400)
RBC: 4.52 MIL/uL (ref 4.22–5.81)
RDW: 13.4 % (ref 11.5–15.5)
WBC: 3.4 10*3/uL — ABNORMAL LOW (ref 4.0–10.5)

## 2017-10-30 LAB — BASIC METABOLIC PANEL
Anion gap: 8 (ref 5–15)
BUN: 12 mg/dL (ref 6–20)
CO2: 27 mmol/L (ref 22–32)
Calcium: 8.7 mg/dL — ABNORMAL LOW (ref 8.9–10.3)
Chloride: 104 mmol/L (ref 101–111)
Creatinine, Ser: 0.93 mg/dL (ref 0.61–1.24)
GFR calc Af Amer: >60 mL/min (ref >60)
GFR calc non Af Amer: >60 mL/min (ref >60)
Glucose, Bld: 122 mg/dL — ABNORMAL HIGH (ref 65–99)
Potassium: 4 mmol/L (ref 3.5–5.1)
Sodium: 139 mmol/L (ref 135–145)

## 2017-10-30 LAB — TROPONIN I: Troponin I: <0.03 ng/mL (ref <0.03)

## 2017-10-30 MED ORDER — DOXYCYCLINE HYCLATE 100 MG PO CAPS: 100.0000 mg | ORAL_CAPSULE | Freq: Two times a day (BID) | ORAL | 0 refills | Status: AC

## 2017-10-30 MED ORDER — ALBUTEROL SULFATE HFA 108 (90 BASE) MCG/ACT IN AERS: 1.0000 | INHALATION_SPRAY | Freq: Four times a day (QID) | RESPIRATORY_TRACT | 0 refills | Status: DC | PRN

## 2017-10-30 MED ORDER — ALBUTEROL (5 MG/ML) CONTINUOUS INHALATION SOLN
INHALATION_SOLUTION | RESPIRATORY_TRACT | Status: AC
  Administered 2017-10-30: 2.5 mg
  Filled 2017-10-30: qty 0.5

## 2017-10-30 MED ORDER — ALBUTEROL SULFATE (2.5 MG/3ML) 0.083% IN NEBU: 5.0000 mg | INHALATION_SOLUTION | Freq: Once | RESPIRATORY_TRACT | Status: DC

## 2017-10-30 MED ORDER — IPRATROPIUM-ALBUTEROL 0.5-2.5 (3) MG/3ML IN SOLN
RESPIRATORY_TRACT | Status: AC
  Administered 2017-10-30: 3 mL
  Filled 2017-10-30: qty 3

## 2017-10-30 NOTE — ED Provider Notes (Signed)
Emergency Department Provider Note   I have reviewed the triage vital signs and the nursing notes.   HISTORY  Chief Complaint Wheezing   HPI Cameron [name] is a 71 y.o. male presents to the emergency department for evaluation of mild cough.  States it is worsening over the past 3-4 days.  He denies any chest pain or significant dyspnea.  No modifying factors.  He does not smoke or have exposure to secondhand smoke.  He has no prior history of asthma or COPD.  No fevers or chills.  No recent travel.  With his cough he is not producing any phlegm.  No hemoptysis.  No associated nausea or vomiting.    Past Medical History:  Diagnosis Date  . Arthritis    degenerative joint disease, knees   . Constipation due to pain medication   . Diverticulosis of colon   . Fatty liver   . GERD (gastroesophageal reflux disease)    takes Prevacid  . Glaucoma (increased eye pressure)   . HTN (hypertension)   . Iron deficiency anemia    h/o  . Keloid of skin   . Lumbar spondylosis    possible right sided radiculopathy with hx of surgery by Dr. Cyndy Freeze in 2003  . Osteopenia    DEXA 12-2006    . Personal history of colonic polyps 2002   TUBULAR ADENOMA  . Prostatitis, acute 04-2011   admitted to [facility]  . Sleep apnea    on CPAP, last study [facility]  . Tick bite     Patient Active Problem List   Diagnosis Date Noted  . Lumbar stenosis 08/22/2015  . PCP NOTES >>>>> 07/28/2015  . Primary osteoarthritis of left knee 10/08/2014  . Primary osteoarthritis of knee 10/08/2014  . Abnormal prostate specific antigen 08/16/2014  . Detrusor muscle hypertonia 08/16/2014  . Right knee DJD 06/11/2014  . Morbid obesity ([place]) 06/11/2014  . Cubital tunnel syndrome 05/19/2014  . Annual physical exam 03/30/2012  . B12 deficiency 03/30/2012  . Osteopenia, low vit D 03/30/2012  . Fatty liver 04/16/2011  . Esophageal reflux 04/16/2011  . Obesity 04/16/2011  . OSA on CPAP 03/13/2009  . Essential  hypertension 08/02/2007  . DIVERTICULOSIS, COLON 08/02/2007    Past Surgical History:  Procedure Laterality Date  . BACK SURGERY  2003  . CHOLECYSTECTOMY  2003  . COLONOSCOPY    . LAPAROSCOPIC APPENDECTOMY N/A 01/16/2016   Procedure: APPENDECTOMY LAPAROSCOPIC;  Surgeon: Fanny Skates, MD;  Location: [place];  Service: General;  Laterality: N/A;  . LUMBAR LAMINECTOMY/DECOMPRESSION MICRODISCECTOMY N/A 08/22/2015   Procedure: Lumbar two to lumbar three lumbar three to lumbar four microdiskectomy;  Surgeon: Ashok Pall, MD;  Location: [place] [facility];  Service: Neurosurgery;  Laterality: N/A;  L23 L34 microdiskectomy  . TOTAL KNEE ARTHROPLASTY Right 06/11/2014   Procedure: TOTAL KNEE ARTHROPLASTY;  Surgeon: Hessie Dibble, MD;  Location: [place];  Service: Orthopedics;  Laterality: Right;  . TOTAL KNEE ARTHROPLASTY Left 10/08/2014   DR DALLDORF  . TOTAL KNEE ARTHROPLASTY Left 10/08/2014   Procedure: TOTAL LEFT KNEE ARTHROPLASTY;  Surgeon: Hessie Dibble, MD;  Location: [facility];  Service: Orthopedics;  Laterality: Left;    Current Outpatient Rx  . Order #: [id number] Class: Print  . Order #: [id number] Class: Historical Med  . Order #: [id number] Class: Historical Med  . Order #: [id number] Class: Print  . Order #: [id number] Class: Normal  . Order #: [id number] Class: Historical Med  . Order #: [id number] Class: Normal  . Order #:  [id number] Class: Historical Med  . Order #: [id number] Class: Historical Med  . Order #: [id number] Class: Historical Med    Allergies Chlorhexidine gluconate; Ciprofloxacin hcl; and Clindamycin  Family History  Problem Relation Age of Onset  . Dementia Mother   . Diverticulitis Father   . Colon cancer Maternal Uncle        dx age 43s  . Colon cancer Other        grandfather, dx age 56  . Stroke Other        maternal aunt in her 48s  . Breast cancer Maternal Aunt   . Prostate cancer Neg Hx   . Heart attack Neg Hx   . Hypertension Neg Hx   . Diabetes Neg Hx     Social  History Social History   Tobacco Use  . Smoking status: Never Smoker  . Smokeless tobacco: Never Used  Substance Use Topics  . Alcohol use: Yes    Comment: most at 1/month  . Drug use: No    Review of Systems  Constitutional: No fever/chills Eyes: No visual changes. ENT: No sore throat. Cardiovascular: Denies chest pain. Respiratory: Denies shortness of breath. Positive wheezing.  Gastrointestinal: No abdominal pain.  No nausea, no vomiting.  No diarrhea.  No constipation. Genitourinary: Negative for dysuria. Musculoskeletal: Negative for back pain. Skin: Negative for rash. Neurological: Negative for headaches, focal weakness or numbness.  10-point ROS otherwise negative.  ____________________________________________   PHYSICAL EXAM:  VITAL SIGNS: ED Triage Vitals   Vitals:   10/30/17 1900 10/30/17 1948  BP: 131/82 (!) 147/86  Pulse: 68 64  Resp: (!) 21 18  Temp:  98.3 F (36.8 C)  SpO2: 95% 95%    Constitutional: Alert and oriented. Well appearing and in no acute distress. Eyes: Conjunctivae are normal.  Head: Atraumatic. Nose: No congestion/rhinnorhea. Mouth/Throat: Mucous membranes are moist.   Neck: No stridor.  Cardiovascular: Normal rate, regular rhythm. Good peripheral circulation. Grossly normal heart sounds.   Respiratory: Normal respiratory effort.  No retractions. Lungs with faint end-expiratory wheeze with forced expiration.  Gastrointestinal: Soft and nontender. No distention.  Musculoskeletal: No lower extremity tenderness nor edema. No gross deformities of extremities. Neurologic:  Normal speech and language. No gross focal neurologic deficits are appreciated.  Skin:  Skin is warm, dry and intact. No rash noted.  ____________________________________________   LABS (all labs ordered are listed, but only abnormal results are displayed)  Labs Reviewed  BASIC METABOLIC PANEL - Abnormal; Notable for the following components:      Result Value    Glucose, Bld 122 (*)    Calcium 8.7 (*)    All other components within normal limits  CBC WITH DIFFERENTIAL/PLATELET - Abnormal; Notable for the following components:   WBC 3.4 (*)    Neutro Abs 1.1 (*)    All other components within normal limits  TROPONIN I   ____________________________________________  EKG   EKG Interpretation  Date/Time:  Sunday October 30 2017 18:10:29 EST Ventricular Rate:  70 PR Interval:    QRS Duration: 90 QT Interval:  382 QTC Calculation: 413 R Axis:   49 Text Interpretation:  Sinus rhythm No STEMI.  Confirmed by Nanda Quinton [phone]) on 10/30/2017 6:16:53 PM Also confirmed by Nanda Quinton [phone]), editor Hattie Perch [phone])  on 10/31/2017 6:51:59 AM       ____________________________________________  RADIOLOGY  Dg Chest 2 View  Result Date: 10/30/2017 CLINICAL DATA:  Wheezing 3-4 days with cough. EXAM: CHEST  2 VIEW COMPARISON:  06/04/2014  FINDINGS: Lungs are adequately inflated with mild posterior left retrocardiac opacification which may be due to vascular crowding, although atelectasis or early infection is possible. Stable elevation the right hemidiaphragm. Cardiomediastinal silhouette and remainder of the exam is unchanged. IMPRESSION: Mild opacification in the posterior left retrocardiac region which may be due to vascular crowding, although atelectasis or early infection is possible. Electronically Signed   By: Marin Olp M.D.   On: 10/30/2017 17:47    ____________________________________________   PROCEDURES  Procedure(s) performed:   Procedures  None ____________________________________________   INITIAL IMPRESSION / ASSESSMENT AND PLAN / ED COURSE  Pertinent labs & imaging results that were available during my care of the patient were reviewed by me and considered in my medical decision making (see chart for details).  Wheezing and cough over the past 3-4 days without fever.  Patient is well-appearing, awake,  alert, with no hypoxemia.  X-ray shows possible developing infectious process on the lateral view.  Given his lack of wheezing history plan for baseline labs, EKG, troponin have low suspicion for ACS or other cardiac wheezing.   Labs negative. No trending with 3-4 days of symptoms. Possible early infiltrate on CXR. Plan to cover with Doxycycline and will send home with Albuterol PRN.   At this time, I do not feel there is any life-threatening condition present. I have reviewed and discussed all results (EKG, imaging, lab, urine as appropriate), exam findings with patient. I have reviewed nursing notes and appropriate previous records.  I feel the patient is safe to be discharged home without further emergent workup. Discussed usual and customary return precautions. Patient and family (if present) verbalize understanding and are comfortable with this plan.  Patient will follow-up with their primary care provider. If they do not have a primary care provider, information for follow-up has been provided to them. All questions have been answered.  ____________________________________________  FINAL CLINICAL IMPRESSION(S) / ED DIAGNOSES  Final diagnoses:  Community acquired pneumonia, unspecified laterality  Cough  Wheezing     MEDICATIONS GIVEN DURING THIS VISIT:  Medications  albuterol (PROVENTIL, VENTOLIN) (5 MG/ML) 0.5% continuous inhalation solution (2.5 mg  Given 10/30/17 1728)  ipratropium-albuterol (DUONEB) 0.5-2.5 (3) MG/3ML nebulizer solution (3 mLs  Given 10/30/17 1728)     NEW OUTPATIENT MEDICATIONS STARTED DURING THIS VISIT:  Doxycycline and Albuterol   Note:  This document was prepared using Dragon voice recognition software and may include unintentional dictation errors.  Nanda Quinton, MD Emergency Medicine    Romani Wilbon, [facility], MD 10/31/17 [phone]

## 2017-10-30 NOTE — ED Triage Notes (Signed)
Pt c/o wheezing that has progressively worsened over 3-4 days. Denies ShOB.

## 2017-10-30 NOTE — ED Notes (Signed)
Discharge instructions and prescriptions reviewed - voiced understanding 

## 2017-10-30 NOTE — Discharge Instructions (Signed)
We believe that your symptoms are caused today by pneumonia, an infection in your lung(s).  Fortunately you should start to improve quickly after taking your antibiotics.  Please take the full course of antibiotics as prescribed and drink plenty of fluids.   ° °Follow up with your doctor within 1-2 days.  If you develop any new or worsening symptoms, including but not limited to fever in spite of taking over-the-counter ibuprofen and/or Tylenol, persistent vomiting, worsening shortness of breath, or other symptoms that concern you, please return to the Emergency Department immediately.  ° ° °Pneumonia °Pneumonia is an infection of the lungs.  °CAUSES °Pneumonia may be caused by bacteria or a virus. Usually, these infections are caused by breathing infectious particles into the lungs (respiratory tract). °SIGNS AND SYMPTOMS  °Cough. °Fever. °Chest pain. °Increased rate of breathing. °Wheezing. °Mucus production. °DIAGNOSIS  °If you have the common symptoms of pneumonia, your health care provider will typically confirm the diagnosis with a chest X-ray. The X-ray will show an abnormality in the lung (pulmonary infiltrate) if you have pneumonia. Other tests of your blood, urine, or sputum may be done to find the specific cause of your pneumonia. Your health care provider may also do tests (blood gases or pulse oximetry) to see how well your lungs are working. °TREATMENT  °Some forms of pneumonia may be spread to other people when you cough or sneeze. You may be asked to wear a mask before and during your exam. Pneumonia that is caused by bacteria is treated with antibiotic medicine. Pneumonia that is caused by the influenza virus may be treated with an antiviral medicine. Most other viral infections must run their course. These infections will not respond to antibiotics.  °HOME CARE INSTRUCTIONS  °Cough suppressants may be used if you are losing too much rest. However, coughing protects you by clearing your lungs. You  should avoid using cough suppressants if you can. °Your health care provider may have prescribed medicine if he or she thinks your pneumonia is caused by bacteria or influenza. Finish your medicine even if you start to feel better. °Your health care provider may also prescribe an expectorant. This loosens the mucus to be coughed up. °Take medicines only as directed by your health care provider. °Do not smoke. Smoking is a common cause of bronchitis and can contribute to pneumonia. If you are a smoker and continue to smoke, your cough may last several weeks after your pneumonia has cleared. °A cold steam vaporizer or humidifier in your room or home may help loosen mucus. °Coughing is often worse at night. Sleeping in a semi-upright position in a recliner or using a couple pillows under your head will help with this. °Get rest as you feel it is needed. Your body will usually let you know when you need to rest. °PREVENTION °A pneumococcal shot (vaccine) is available to prevent a common bacterial cause of pneumonia. This is usually suggested for: °People over 65 years old. °Patients on chemotherapy. °People with chronic lung problems, such as bronchitis or emphysema. °People with immune system problems. °If you are over 65 or have a high risk condition, you may receive the pneumococcal vaccine if you have not received it before. In some countries, a routine influenza vaccine is also recommended. This vaccine can help prevent some cases of pneumonia. You may be offered the influenza vaccine as part of your care. °If you smoke, it is time to quit. You may receive instructions on how to stop smoking. Your   health care provider can provide medicines and counseling to help you quit. °SEEK MEDICAL CARE IF: °You have a fever. °SEEK IMMEDIATE MEDICAL CARE IF:  °Your illness becomes worse. This is especially true if you are elderly or weakened from any other disease. °You cannot control your cough with suppressants and are losing  sleep. °You begin coughing up blood. °You develop pain which is getting worse or is uncontrolled with medicines. °Any of the symptoms which initially brought you in for treatment are getting worse rather than better. °You develop shortness of breath or chest pain. °MAKE SURE YOU:  °Understand these instructions. °Will watch your condition. °Will get help right away if you are not doing well or get worse. °Document Released: 10/25/2005 Document Revised: 03/11/2014 Document Reviewed: 01/14/2011 °ExitCare® Patient Information ©2015 [employer]. This information is not intended to replace advice given to you by your health care provider. Make sure you discuss any questions you have with your health care provider. ° ° ° °

## 2017-11-02 ENCOUNTER — Encounter: Payer: Self-pay | Admitting: Internal Medicine

## 2017-11-02 ENCOUNTER — Ambulatory Visit: Payer: Medicare Other | Admitting: Internal Medicine

## 2017-11-02 VITALS — BP 147/82 | HR 64 | Temp 97.9°F | Resp 14 | Ht 72.0 in | Wt 318.5 lb

## 2017-11-02 DIAGNOSIS — J40 Bronchitis, not specified as acute or chronic: Secondary | ICD-10-CM | POA: Diagnosis not present

## 2017-11-02 DIAGNOSIS — J9801 Acute bronchospasm: Secondary | ICD-10-CM | POA: Diagnosis not present

## 2017-11-02 DIAGNOSIS — J189 Pneumonia, unspecified organism: Secondary | ICD-10-CM

## 2017-11-02 DIAGNOSIS — J181 Lobar pneumonia, unspecified organism: Secondary | ICD-10-CM

## 2017-11-02 MED ORDER — PREDNISONE 10 MG PO TABS: ORAL_TABLET | ORAL | 0 refills | Status: DC

## 2017-11-02 NOTE — Progress Notes (Signed)
Pre visit review using our clinic review tool, if applicable. No additional management support is needed unless otherwise documented below in the visit note. 

## 2017-11-02 NOTE — Progress Notes (Signed)
Subjective:    Patient ID: Cameron [name], male    DOB: 1946-10-20, 71 y.o.   MRN: [id number]  DOS:  11/02/2017 Type of visit - description : Acute, ER follow-up Interval history: Was seen at the ER 10/29/2017 with cough, wheezing, going on for 3 or 4 days, workup included: Chest x-ray: Question of infiltrate WBCs 3.4.  Calcium slightly low. Troponin negative Rx doxycycline and albuterol as needed.    Review of Systems  since he left the emergency room is taking the medications as prescribed. He is however feeling the same, still coughing, wheezing, some nasal discharge Still short of breath to some extent. No fever chills No nausea, vomiting, diarrhea  Past Medical History:  Diagnosis Date  . Arthritis    degenerative joint disease, knees   . Constipation due to pain medication   . Diverticulosis of colon   . Fatty liver   . GERD (gastroesophageal reflux disease)    takes Prevacid  . Glaucoma (increased eye pressure)   . HTN (hypertension)   . Iron deficiency anemia    h/o  . Keloid of skin   . Lumbar spondylosis    possible right sided radiculopathy with hx of surgery by Dr. Cyndy Freeze in 2003  . Osteopenia    DEXA 12-2006    . Personal history of colonic polyps 2002   TUBULAR ADENOMA  . Prostatitis, acute 04-2011   admitted to [facility]  . Sleep apnea    on CPAP, last study [facility]  . Tick bite     Past Surgical History:  Procedure Laterality Date  . BACK SURGERY  2003  . CHOLECYSTECTOMY  2003  . COLONOSCOPY    . LAPAROSCOPIC APPENDECTOMY N/A 01/16/2016   Procedure: APPENDECTOMY LAPAROSCOPIC;  Surgeon: Fanny Skates, MD;  Location: [place];  Service: General;  Laterality: N/A;  . LUMBAR LAMINECTOMY/DECOMPRESSION MICRODISCECTOMY N/A 08/22/2015   Procedure: Lumbar two to lumbar three lumbar three to lumbar four microdiskectomy;  Surgeon: Ashok Pall, MD;  Location: [place] [facility];  Service: Neurosurgery;  Laterality: N/A;  L23 L34 microdiskectomy  . TOTAL KNEE  ARTHROPLASTY Right 06/11/2014   Procedure: TOTAL KNEE ARTHROPLASTY;  Surgeon: Hessie Dibble, MD;  Location: [place];  Service: Orthopedics;  Laterality: Right;  . TOTAL KNEE ARTHROPLASTY Left 10/08/2014   DR DALLDORF  . TOTAL KNEE ARTHROPLASTY Left 10/08/2014   Procedure: TOTAL LEFT KNEE ARTHROPLASTY;  Surgeon: Hessie Dibble, MD;  Location: [place];  Service: Orthopedics;  Laterality: Left;    Social History   Socioeconomic History  . Marital status: Married    Spouse name: Not on file  . Number of children: 2  . Years of education: Not on file  . Highest education level: Not on file  Social Needs  . Financial resource strain: Not on file  . Food insecurity - worry: Not on file  . Food insecurity - inability: Not on file  . Transportation needs - medical: Not on file  . Transportation needs - non-medical: Not on file  Occupational History  . Occupation: retired--works in [place] Use  . Smoking status: Never Smoker  . Smokeless tobacco: Never Used  Substance and Sexual Activity  . Alcohol use: Yes    Comment: most at 1/month  . Drug use: No  . Sexual activity: Not on file  Other Topics Concern  . Not on file  Social History Narrative   Household- pt and wife   plays tennis sometimes  Allergies as of 11/02/2017      Reactions   Chlorhexidine Gluconate Hives, Other (See Comments)   Red rash developed on upper chest   Ciprofloxacin Hcl Itching   Clindamycin Itching, Rash      Medication List        Accurate as of 11/02/17 11:59 PM. Always use your most recent med list.          albuterol 108 (90 Base) MCG/ACT inhaler Commonly known as:  PROVENTIL HFA;VENTOLIN HFA Inhale 1-2 puffs into the lungs every 6 (six) hours as needed for wheezing or shortness of breath.   aspirin 81 MG tablet Take 81 mg by mouth daily.   doxycycline 100 MG capsule Commonly known as:  VIBRAMYCIN Take 1 capsule (100 mg total) by mouth 2 (two) times daily for 7 days.   folic  acid 1 MG tablet Commonly known as:  FOLVITE Take 1 tablet (1 mg total) by mouth daily.   latanoprost 0.005 % ophthalmic solution Commonly known as:  XALATAN Place 1 drop into both eyes at bedtime.   metoprolol succinate 25 MG 24 hr tablet Commonly known as:  TOPROL-XL TAKE ONE-HALF TABLET BY  MOUTH DAILY   multivitamin with minerals Tabs tablet Take 1 tablet by mouth daily.   MYRBETRIQ 50 MG Tb24 tablet Generic drug:  mirabegron ER Take 50 mg by mouth daily.   predniSONE 10 MG tablet Commonly known as:  DELTASONE 4 tablets x 2 days, 3 tabs x 2 days, 2 tabs x 2 days, 1 tab x 2 days   timolol 0.5 % ophthalmic solution Commonly known as:  BETIMOL Place 2 drops 2 (two) times daily into both eyes. Reported on 01/16/2016   vitamin C 1000 MG tablet Take 1,000 mg by mouth 2 (two) times daily.          Objective:   Physical Exam BP (!) 147/82 (BP Location: Left Arm, Patient Position: Sitting, Cuff Size: Normal)   Pulse 64   Temp 97.9 F (36.6 C) (Oral)   Resp 14   Ht 6' (1.829 m)   Wt (!) 318 lb 8 oz (144.5 kg)   SpO2 97%   BMI 43.20 kg/m  General:   Well developed, well nourished . NAD.  HEENT:  Normocephalic . Face symmetric, atraumatic TMs normal, wax removed from the left. Throat symmetric, nose quite congested, sinuses non-TTP Lungs:  Few rhonchi, + end expiratory wheezing.  No distress. Normal respiratory effort, no intercostal retractions, no accessory muscle use. Heart: RRR,  no murmur.  No pretibial edema bilaterally  Skin: Not pale. Not jaundice Neurologic:  alert & oriented X3.  Speech normal, gait appropriate for age and unassisted Psych--  Cognition and judgment appear intact.  Cooperative with normal attention span and concentration.  Behavior appropriate. No anxious or depressed appearing.      Assessment & Plan:    Assessment Hypertension OSA on CPAP GI: GERD, Fatty liver RAD (occ wheezing w/ URIs, inhalers) DJD: lumbar spondylosis,  back surgery 2003, knee replacements GU:--LUTS  Dr Venia Minks ([facility]) --h/o prostatitis admitted 2012 [facility] Glaucoma Osteopenia per DEXA 2008, multiple attempts to redo bone density test failed.  h/o iron deficiency  H/o  B12 and vit D def, 2016 levels wnl  PLAN:   Bronchitis, bronchospasm: Patient has a background of reactive airway disease, presents with bronchitis/bronchospasm.  Will continue with doxycycline, albuterol.  Add prednisone.  See instructions. Question of infiltrate on chest x-ray, recheck in 1 month.

## 2017-11-02 NOTE — Patient Instructions (Signed)
Rest, fluids , tylenol  For cough:  Take Mucinex DM twice a day as needed until better  For wheezing: Albuterol if needed  IF nasal congestion: Use OTC Nasocort or Flonase : 2 nasal sprays on each side of the nose in the morning until you feel better   Avoid decongestants such as  Pseudoephedrine or phenylephrine     Take the antibiotic as prescribed until gone   Prednisone for few days   Call if not gradually better over the next  10 days  Call anytime if the symptoms are severe  Get a XR in 1 month

## 2017-11-03 NOTE — Assessment & Plan Note (Signed)
Bronchitis, bronchospasm: Patient has a background of reactive airway disease, presents with bronchitis/bronchospasm.  Will continue with doxycycline, albuterol.  Add prednisone.  See instructions. Question of infiltrate on chest x-ray, recheck in 1 month.

## 2017-11-05 ENCOUNTER — Other Ambulatory Visit: Payer: Self-pay | Admitting: Internal Medicine

## 2017-11-25 DIAGNOSIS — G4733 Obstructive sleep apnea (adult) (pediatric): Secondary | ICD-10-CM | POA: Diagnosis not present

## 2017-12-01 DIAGNOSIS — H2513 Age-related nuclear cataract, bilateral: Secondary | ICD-10-CM | POA: Diagnosis not present

## 2017-12-01 DIAGNOSIS — H04123 Dry eye syndrome of bilateral lacrimal glands: Secondary | ICD-10-CM | POA: Diagnosis not present

## 2017-12-01 DIAGNOSIS — H401131 Primary open-angle glaucoma, bilateral, mild stage: Secondary | ICD-10-CM | POA: Diagnosis not present

## 2017-12-05 ENCOUNTER — Encounter (HOSPITAL_BASED_OUTPATIENT_CLINIC_OR_DEPARTMENT_OTHER): Payer: Self-pay | Admitting: Emergency Medicine

## 2017-12-05 ENCOUNTER — Other Ambulatory Visit: Payer: Self-pay

## 2017-12-05 ENCOUNTER — Emergency Department (HOSPITAL_BASED_OUTPATIENT_CLINIC_OR_DEPARTMENT_OTHER)
Admission: EM | Admit: 2017-12-05 | Discharge: 2017-12-05 | Disposition: A | Discharge status: Home / Self Care | Payer: Medicare Other | Attending: Emergency Medicine | Admitting: Emergency Medicine

## 2017-12-05 ENCOUNTER — Emergency Department (HOSPITAL_BASED_OUTPATIENT_CLINIC_OR_DEPARTMENT_OTHER): Payer: Medicare Other

## 2017-12-05 DIAGNOSIS — Z7982 Long term (current) use of aspirin: Secondary | ICD-10-CM | POA: Diagnosis not present

## 2017-12-05 DIAGNOSIS — R1110 Vomiting, unspecified: Secondary | ICD-10-CM | POA: Diagnosis not present

## 2017-12-05 DIAGNOSIS — Z96653 Presence of artificial knee joint, bilateral: Secondary | ICD-10-CM | POA: Insufficient documentation

## 2017-12-05 DIAGNOSIS — I10 Essential (primary) hypertension: Secondary | ICD-10-CM | POA: Diagnosis not present

## 2017-12-05 DIAGNOSIS — R062 Wheezing: Secondary | ICD-10-CM | POA: Diagnosis not present

## 2017-12-05 DIAGNOSIS — R112 Nausea with vomiting, unspecified: Secondary | ICD-10-CM | POA: Insufficient documentation

## 2017-12-05 DIAGNOSIS — Z79899 Other long term (current) drug therapy: Secondary | ICD-10-CM | POA: Insufficient documentation

## 2017-12-05 DIAGNOSIS — R600 Localized edema: Secondary | ICD-10-CM | POA: Diagnosis not present

## 2017-12-05 LAB — COMPREHENSIVE METABOLIC PANEL
ALT: 22 U/L (ref 17–63)
AST: 25 U/L (ref 15–41)
Albumin: 4.2 g/dL (ref 3.5–5.0)
Alkaline Phosphatase: 95 U/L (ref 38–126)
Anion gap: 9 (ref 5–15)
BUN: 14 mg/dL (ref 6–20)
CO2: 27 mmol/L (ref 22–32)
Calcium: 9.1 mg/dL (ref 8.9–10.3)
Chloride: 106 mmol/L (ref 101–111)
Creatinine, Ser: 0.89 mg/dL (ref 0.61–1.24)
GFR calc Af Amer: >60 mL/min (ref >60)
GFR calc non Af Amer: >60 mL/min (ref >60)
Glucose, Bld: 142 mg/dL — ABNORMAL HIGH (ref 65–99)
Potassium: 3.8 mmol/L (ref 3.5–5.1)
Sodium: 142 mmol/L (ref 135–145)
Total Bilirubin: 1 mg/dL (ref 0.3–1.2)
Total Protein: 7.3 g/dL (ref 6.5–8.1)

## 2017-12-05 LAB — CBC
HCT: 44 % (ref 39.0–52.0)
Hemoglobin: 14.8 g/dL (ref 13.0–17.0)
MCH: 30 pg (ref 26.0–34.0)
MCHC: 33.6 g/dL (ref 30.0–36.0)
MCV: 89.1 fL (ref 78.0–100.0)
Platelets: 211 10*3/uL (ref 150–400)
RBC: 4.94 MIL/uL (ref 4.22–5.81)
RDW: 13.3 % (ref 11.5–15.5)
WBC: 5.7 10*3/uL (ref 4.0–10.5)

## 2017-12-05 LAB — LIPASE, BLOOD: Lipase: 39 U/L (ref 11–51)

## 2017-12-05 MED ORDER — ONDANSETRON HCL 4 MG/2ML IJ SOLN: 4.0000 mg | Freq: Once | INTRAMUSCULAR | Status: DC | PRN

## 2017-12-05 MED ORDER — ONDANSETRON 4 MG PO TBDP: 4.0000 mg | ORAL_TABLET | Freq: Three times a day (TID) | ORAL | 0 refills | Status: DC | PRN

## 2017-12-05 MED ORDER — METOCLOPRAMIDE HCL 5 MG/ML IJ SOLN
10.0000 mg | INTRAMUSCULAR | Status: AC
  Administered 2017-12-05: 10 mg via INTRAVENOUS
  Filled 2017-12-05: qty 2

## 2017-12-05 MED ORDER — ONDANSETRON HCL 4 MG/2ML IJ SOLN
4.0000 mg | Freq: Once | INTRAMUSCULAR | Status: AC
  Administered 2017-12-05: 4 mg via INTRAVENOUS
  Filled 2017-12-05: qty 2

## 2017-12-05 NOTE — ED Triage Notes (Signed)
Vomiting since midnnight.  Approx 5 times.  Denies pain.  Denies vomiting.  No one else in household with similar sx.

## 2017-12-05 NOTE — ED Notes (Signed)
ED Provider at bedside. 

## 2017-12-05 NOTE — ED Provider Notes (Signed)
[place] EMERGENCY DEPARTMENT Provider Note   CSN: [id number] Arrival date & time: 12/05/17  2355     History   Chief Complaint Chief Complaint  Patient presents with  . Emesis    HPI Cameron [name] is a 72 y.o. male.  HPI  This is a 72 year old male with a history of hypertension, arthritis who presents with vomiting.  Patient reports onset of vomiting at midnight.  He has had multiple episodes of nonbilious, nonbloody emesis.  He denies any diarrhea.  He denies any chest pain, shortness of breath, abdominal pain.  No known sick contacts.  Denies upper respiratory symptoms.  Denies fevers or chills.  Reports that he feels persistently nauseous.  Patient reports that he had pneumonia in December.  He reports some persistent "wheezing."  He did finish a full course of antibiotics.  Past Medical History:  Diagnosis Date  . Arthritis    degenerative joint disease, knees   . Constipation due to pain medication   . Diverticulosis of colon   . Fatty liver   . GERD (gastroesophageal reflux disease)    takes Prevacid  . Glaucoma (increased eye pressure)   . HTN (hypertension)   . Iron deficiency anemia    h/o  . Keloid of skin   . Lumbar spondylosis    possible right sided radiculopathy with hx of surgery by Dr. Cyndy Freeze in 2003  . Osteopenia    DEXA 12-2006    . Personal history of colonic polyps 2002   TUBULAR ADENOMA  . Prostatitis, acute 04-2011   admitted to [facility]  . Sleep apnea    on CPAP, last study [facility]  . Tick bite     Patient Active Problem List   Diagnosis Date Noted  . Lumbar stenosis 08/22/2015  . PCP NOTES >>>>> 07/28/2015  . Primary osteoarthritis of left knee 10/08/2014  . Primary osteoarthritis of knee 10/08/2014  . Abnormal prostate specific antigen 08/16/2014  . Detrusor muscle hypertonia 08/16/2014  . Right knee DJD 06/11/2014  . Morbid obesity ([place]) 06/11/2014  . Cubital tunnel syndrome 05/19/2014  . Annual physical exam 03/30/2012   . B12 deficiency 03/30/2012  . Osteopenia, low vit D 03/30/2012  . Fatty liver 04/16/2011  . Esophageal reflux 04/16/2011  . Obesity 04/16/2011  . OSA on CPAP 03/13/2009  . Essential hypertension 08/02/2007  . DIVERTICULOSIS, COLON 08/02/2007    Past Surgical History:  Procedure Laterality Date  . BACK SURGERY  2003  . CHOLECYSTECTOMY  2003  . COLONOSCOPY    . LAPAROSCOPIC APPENDECTOMY N/A 01/16/2016   Procedure: APPENDECTOMY LAPAROSCOPIC;  Surgeon: Fanny Skates, MD;  Location: [place];  Service: General;  Laterality: N/A;  . LUMBAR LAMINECTOMY/DECOMPRESSION MICRODISCECTOMY N/A 08/22/2015   Procedure: Lumbar two to lumbar three lumbar three to lumbar four microdiskectomy;  Surgeon: Ashok Pall, MD;  Location: [place] NEURO ORS;  Service: Neurosurgery;  Laterality: N/A;  L23 L34 microdiskectomy  . TOTAL KNEE ARTHROPLASTY Right 06/11/2014   Procedure: TOTAL KNEE ARTHROPLASTY;  Surgeon: Hessie Dibble, MD;  Location: [place];  Service: Orthopedics;  Laterality: Right;  . TOTAL KNEE ARTHROPLASTY Left 10/08/2014   DR DALLDORF  . TOTAL KNEE ARTHROPLASTY Left 10/08/2014   Procedure: TOTAL LEFT KNEE ARTHROPLASTY;  Surgeon: Hessie Dibble, MD;  Location: [place];  Service: Orthopedics;  Laterality: Left;       Home Medications    Prior to Admission medications   Medication Sig Start Date End Date Taking? Authorizing Provider  albuterol (  PROVENTIL HFA;VENTOLIN HFA) 108 (90 Base) MCG/ACT inhaler Inhale 1-2 puffs into the lungs every 6 (six) hours as needed for wheezing or shortness of breath. 10/30/17   Long, Wonda Olds, MD  Ascorbic Acid (VITAMIN C) 1000 MG tablet Take 1,000 mg by mouth 2 (two) times daily.     [provider]  aspirin 81 MG tablet Take 81 mg by mouth daily.    [provider]  folic acid (FOLVITE) 1 MG tablet Take 1 tablet (1 mg total) by mouth daily. 11/07/17   Colon Branch, MD  latanoprost (XALATAN) 0.005 % ophthalmic solution Place 1 drop into both eyes at  bedtime.  05/13/14   [provider]  metoprolol succinate (TOPROL-XL) 25 MG 24 hr tablet TAKE ONE-HALF TABLET BY  MOUTH DAILY 10/18/17   Colon Branch, MD  Multiple Vitamin (MULTIVITAMIN WITH MINERALS) TABS tablet Take 1 tablet by mouth daily.    [provider]  MYRBETRIQ 50 MG TB24 tablet Take 50 mg by mouth daily.  04/15/14   [provider]  predniSONE (DELTASONE) 10 MG tablet 4 tablets x 2 days, 3 tabs x 2 days, 2 tabs x 2 days, 1 tab x 2 days 11/02/17   Colon Branch, MD  timolol (BETIMOL) 0.5 % ophthalmic solution Place 2 drops 2 (two) times daily into both eyes. Reported on 01/16/2016    [provider]    Family History Family History  Problem Relation Age of Onset  . Dementia Mother   . Diverticulitis Father   . Colon cancer Maternal Uncle        dx age 73s  . Colon cancer Other        grandfather, dx age 27  . Stroke Other        maternal aunt in her 64s  . Breast cancer Maternal Aunt   . Prostate cancer Neg Hx   . Heart attack Neg Hx   . Hypertension Neg Hx   . Diabetes Neg Hx     Social History Social History   Tobacco Use  . Smoking status: Never Smoker  . Smokeless tobacco: Never Used  Substance Use Topics  . Alcohol use: Yes    Comment: most at 1/month  . Drug use: No     Allergies   Chlorhexidine gluconate; Ciprofloxacin hcl; and Clindamycin   Review of Systems Review of Systems  Constitutional: Negative for fever.  HENT: Negative for congestion.   Respiratory: Positive for wheezing. Negative for cough and shortness of breath.   Cardiovascular: Negative for chest pain.  Gastrointestinal: Positive for nausea and vomiting. Negative for abdominal pain and diarrhea.  Genitourinary: Negative for dysuria.  All other systems reviewed and are negative.    Physical Exam Updated Vital Signs BP 140/90 (BP Location: Right Arm)   Pulse (!) 106   Temp 98.1 F (36.7 C) (Oral)   Resp 16   Ht 6' (1.829 m)   Wt (!) 143.8 kg  (317 lb)   SpO2 96%   BMI 42.99 kg/m   Physical Exam  Constitutional: He is oriented to person, place, and time. He appears well-developed and well-nourished.  Obese, no acute distress  HENT:  Head: Normocephalic and atraumatic.  Mucous membranes  Cardiovascular: Normal rate, regular rhythm and normal heart sounds.  No murmur heard. Pulmonary/Chest: Effort normal. No respiratory distress. He has wheezes.  Occasional expiratory wheeze, no respiratory distress  Abdominal: Soft. Bowel sounds are normal. There is no tenderness. There is no rebound.  Musculoskeletal: He exhibits edema.  Trace bilateral lower extremity edema  Neurological: He is alert and oriented to person, place, and time.  Skin: Skin is warm and dry.  Psychiatric: He has a normal mood and affect.  Nursing note and vitals reviewed.    ED Treatments / Results  Labs (all labs ordered are listed, but only abnormal results are displayed) Labs Reviewed  COMPREHENSIVE METABOLIC PANEL - Abnormal; Notable for the following components:      Result Value   Glucose, Bld 142 (*)    All other components within normal limits  LIPASE, BLOOD  CBC  TROPONIN I    EKG  EKG Interpretation  Date/Time:  Monday December 05 2017 07:44:37 EST Ventricular Rate:  102 PR Interval:    QRS Duration: 98 QT Interval:  352 QTC Calculation: 459 R Axis:   66 Text Interpretation:  Sinus tachycardia Ventricular premature complex Probable left atrial enlargement Confirmed by Thayer Jew [phone]) on 12/05/2017 7:51:58 AM       Radiology No results found.  Procedures Procedures (including critical care time)  Medications Ordered in ED Medications  ondansetron (ZOFRAN) injection 4 mg (not administered)  ondansetron (ZOFRAN) injection 4 mg (4 mg Intravenous Given 12/05/17 0751)     Initial Impression / Assessment and Plan / ED Course  I have reviewed the triage vital signs and the nursing notes.  Pertinent labs & imaging  results that were available during my care of the patient were reviewed by me and considered in my medical decision making (see chart for details).     Patient presents with emesis at midnight.  Overall nontoxic-appearing and vital signs are reassuring.  He does have some mild tachycardia.  No abdominal tenderness.  Lab work initiated.  Suspect viral etiology; however, atypical ACS is also consideration.  EKG shows no evidence of acute ischemia.  Initial lab work is largely reassuring.  Patient was given fluids and Zofran.  Patient signed out to oncoming provider pending imaging and symptom control.  If able to orally hydrate, suspect he may be able to be discharged home.  Final Clinical Impressions(s) / ED Diagnoses   Final diagnoses:  Non-intractable vomiting with nausea, unspecified vomiting type    ED Discharge Orders    None       Merryl Hacker, MD 12/05/17 [phone]

## 2017-12-05 NOTE — ED Provider Notes (Signed)
I received this patient in signout from Dr. Dina Rich.  We were awaiting imaging and lab work results as well as clinical improvement.  Patient's lab work including LFTs, CBC, lipase were normal.  X-ray of chest and abdomen negative acute without any signs of obstruction.  On multiple reassessments the patient denied any abdominal pain.  After receiving anti-emetics, he was able to eat graham crackers and drink fluids with no further episodes of vomiting.  He feels comfortable with discharge.  I have discussed supportive measures, extensively reviewed return precautions including intractable vomiting, fever, abdominal pain, or bloody stools.  Patient voiced understanding and was discharged in satisfactory condition.   Little, Wenda Overland, MD 12/05/17 1037

## 2017-12-05 NOTE — ED Notes (Signed)
Graham crackers and Sprite given for PO challenge.

## 2017-12-13 ENCOUNTER — Encounter: Payer: Self-pay | Admitting: Internal Medicine

## 2017-12-13 ENCOUNTER — Ambulatory Visit: Payer: Medicare Other | Admitting: Internal Medicine

## 2017-12-13 VITALS — BP 132/84 | HR 78 | Temp 98.4°F | Resp 14 | Ht 72.0 in | Wt 315.0 lb

## 2017-12-13 DIAGNOSIS — J9801 Acute bronchospasm: Secondary | ICD-10-CM | POA: Diagnosis not present

## 2017-12-13 MED ORDER — ALBUTEROL SULFATE HFA 108 (90 BASE) MCG/ACT IN AERS: 1.0000 | INHALATION_SPRAY | Freq: Four times a day (QID) | RESPIRATORY_TRACT | 5 refills | Status: DC | PRN

## 2017-12-13 MED ORDER — MOMETASONE FURO-FORMOTEROL FUM 200-5 MCG/ACT IN AERO: 2.0000 | INHALATION_SPRAY | Freq: Two times a day (BID) | RESPIRATORY_TRACT | 2 refills | Status: DC

## 2017-12-13 MED ORDER — PREDNISONE 10 MG PO TABS: ORAL_TABLET | ORAL | 0 refills | Status: DC

## 2017-12-13 NOTE — Patient Instructions (Signed)
Please come back in 1 month for a checkup  Start  Dulera: 2 puffs twice a day  Prednisone for a few days  Continue using albuterol as needed  Flonase daily  Mucinex DM okay if you notice a lot of mucus.

## 2017-12-13 NOTE — Progress Notes (Signed)
Subjective:    Patient ID: Cameron [name], male    DOB: [birth year], 72 y.o.   MRN: [id number]  DOS:  12/13/2017 Type of visit - description : f/u Interval history: Was seen in December with bronchitis, bronchospasm, prescribed prednisone which helped temporarily. Then symptoms came back, continue with cough, chest congestion, some wheezing and shortness of breath. Has a sputum, white in color.  Also, went to the ER 12/05/2017 with GI symptoms, CBC showed no elevated white count, x-ray showed clear lung fields.  GI symptoms better.  Has a history of glaucoma, timolol was recently stopped as it may trigger bronchospasm   Review of Systems Denies fever chills GERD symptoms controlled No green sputum production, no hemoptysis. Sinuses with minimal congestion.  Past Medical History:  Diagnosis Date  . Arthritis    degenerative joint disease, knees   . Constipation due to pain medication   . Diverticulosis of colon   . Fatty liver   . GERD (gastroesophageal reflux disease)    takes Prevacid  . Glaucoma (increased eye pressure)   . HTN (hypertension)   . Iron deficiency anemia    h/o  . Keloid of skin   . Lumbar spondylosis    possible right sided radiculopathy with hx of surgery by Dr. Cyndy Freeze in 2003  . Osteopenia    DEXA 12-2006    . Personal history of colonic polyps 2002   TUBULAR ADENOMA  . Prostatitis, acute 04-2011   admitted to [facility]  . Sleep apnea    on CPAP, last study [facility]  . Tick bite     Past Surgical History:  Procedure Laterality Date  . BACK SURGERY  2003  . CHOLECYSTECTOMY  2003  . COLONOSCOPY    . LAPAROSCOPIC APPENDECTOMY N/A 01/16/2016   Procedure: APPENDECTOMY LAPAROSCOPIC;  Surgeon: Fanny Skates, MD;  Location: [place];  Service: General;  Laterality: N/A;  . LUMBAR LAMINECTOMY/DECOMPRESSION MICRODISCECTOMY N/A 08/22/2015   Procedure: Lumbar two to lumbar three lumbar three to lumbar four microdiskectomy;  Surgeon: Ashok Pall, MD;  Location:  [place] [facility];  Service: Neurosurgery;  Laterality: N/A;  L23 L34 microdiskectomy  . TOTAL KNEE ARTHROPLASTY Right 06/11/2014   Procedure: TOTAL KNEE ARTHROPLASTY;  Surgeon: Hessie Dibble, MD;  Location: [place];  Service: Orthopedics;  Laterality: Right;  . TOTAL KNEE ARTHROPLASTY Left 10/08/2014   DR DALLDORF  . TOTAL KNEE ARTHROPLASTY Left 10/08/2014   Procedure: TOTAL LEFT KNEE ARTHROPLASTY;  Surgeon: Hessie Dibble, MD;  Location: [place];  Service: Orthopedics;  Laterality: Left;    Social History   Socioeconomic History  . Marital status: Married    Spouse name: Not on file  . Number of children: 2  . Years of education: Not on file  . Highest education level: Not on file  Social Needs  . Financial resource strain: Not on file  . Food insecurity - worry: Not on file  . Food insecurity - inability: Not on file  . Transportation needs - medical: Not on file  . Transportation needs - non-medical: Not on file  Occupational History  . Occupation: retired--works in [place] Use  . Smoking status: Never Smoker  . Smokeless tobacco: Never Used  Substance and Sexual Activity  . Alcohol use: Yes    Comment: most at 1/month  . Drug use: No  . Sexual activity: Not on file  Other Topics Concern  . Not on file  Social History Narrative   Household- pt and  wife   plays tennis sometimes       Allergies as of 12/13/2017      Reactions   Chlorhexidine Gluconate Hives, Other (See Comments)   Red rash developed on upper chest   Ciprofloxacin Hcl Itching   Clindamycin Itching, Rash      Medication List        Accurate as of 12/13/17  9:46 AM. Always use your most recent med list.          albuterol 108 (90 Base) MCG/ACT inhaler Commonly known as:  PROVENTIL HFA;VENTOLIN HFA Inhale 1-2 puffs into the lungs every 6 (six) hours as needed for wheezing or shortness of breath.   aspirin 81 MG tablet Take 81 mg by mouth daily.   folic acid 1 MG tablet Commonly known as:   FOLVITE Take 1 tablet (1 mg total) by mouth daily.   latanoprost 0.005 % ophthalmic solution Commonly known as:  XALATAN Place 1 drop into both eyes at bedtime.   metoprolol succinate 25 MG 24 hr tablet Commonly known as:  TOPROL-XL TAKE ONE-HALF TABLET BY  MOUTH DAILY   multivitamin with minerals Tabs tablet Take 1 tablet by mouth daily.   MYRBETRIQ 50 MG Tb24 tablet Generic drug:  mirabegron ER Take 50 mg by mouth daily.   ondansetron 4 MG disintegrating tablet Commonly known as:  ZOFRAN ODT Take 1 tablet (4 mg total) by mouth every 8 (eight) hours as needed for nausea or vomiting.   predniSONE 10 MG tablet Commonly known as:  DELTASONE 4 tablets x 2 days, 3 tabs x 2 days, 2 tabs x 2 days, 1 tab x 2 days   timolol 0.5 % ophthalmic solution Commonly known as:  BETIMOL Place 2 drops 2 (two) times daily into both eyes. Reported on 01/16/2016   vitamin C 1000 MG tablet Take 1,000 mg by mouth 2 (two) times daily.          Objective:   Physical Exam BP 132/84 (BP Location: Left Arm, Patient Position: Sitting, Cuff Size: Normal)   Pulse 78   Temp 98.4 F (36.9 C) (Oral)   Resp 14   Ht 6' (1.829 m)   Wt (!) 315 lb (142.9 kg)   SpO2 95%   BMI 42.72 kg/m  General:   Well developed, well nourished . NAD.  HEENT:  Normocephalic . Face symmetric, atraumatic.  Left TM slightly bulge, right TM normal.  Throat symmetric, nose not congested Lungs:  Slightly increased  expiratory time Normal respiratory effort, no intercostal retractions, no accessory muscle use. Heart: RRR,  no murmur.  No pretibial edema bilaterally  Skin: Not pale. Not jaundice Neurologic:  alert & oriented X3.  Speech normal, gait appropriate for age and unassisted Psych--  Cognition and judgment appear intact.  Cooperative with normal attention span and concentration.  Behavior appropriate. No anxious or depressed appearing.      Assessment & Plan:   Assessment Hypertension OSA on  CPAP GI: GERD, Fatty liver RAD (occ wheezing w/ URIs, inhalers) DJD: lumbar spondylosis, back surgery 2003, knee replacements GU:--LUTS  Dr Venia Minks ([facility]) --h/o prostatitis admitted 2012 [facility] Glaucoma Osteopenia per DEXA 2008, multiple attempts to redo bone density test failed.  h/o iron deficiency  H/o  B12 and vit D def, 2016 levels wnl  PLAN:   Bronchospasm: Was seen in December, improved with prednisone temporarily, then sxs resurface, ongoing cough, wheezing, chest congestion. Went to the ER with an unrelated issue few days ago, chest x-ray showed  no infiltrate.  CBC with no elevated white count. Plan: start  Dulera, second round of prednisone, albuterol.  Follow-up in 1 month.  I do not see the need to take antibiotics at this point.

## 2017-12-13 NOTE — Progress Notes (Signed)
Pre visit review using our clinic review tool, if applicable. No additional management support is needed unless otherwise documented below in the visit note. 

## 2017-12-13 NOTE — Assessment & Plan Note (Signed)
Bronchospasm: Was seen in December, improved with prednisone temporarily, then sxs resurface, ongoing cough, wheezing, chest congestion. Went to the ER with an unrelated issue few days ago, chest x-ray showed no infiltrate.  CBC with no elevated white count. Plan: start  Dulera, second round of prednisone, albuterol.  Follow-up in 1 month.  I do not see the need to take antibiotics at this point.

## 2018-01-10 ENCOUNTER — Ambulatory Visit: Payer: Medicare Other | Admitting: Internal Medicine

## 2018-01-10 ENCOUNTER — Encounter: Payer: Self-pay | Admitting: Internal Medicine

## 2018-01-10 VITALS — BP 130/78 | HR 67 | Temp 97.6°F | Resp 16 | Ht 72.0 in | Wt 318.0 lb

## 2018-01-10 DIAGNOSIS — J4530 Mild persistent asthma, uncomplicated: Secondary | ICD-10-CM

## 2018-01-10 DIAGNOSIS — J45909 Unspecified asthma, uncomplicated: Secondary | ICD-10-CM | POA: Insufficient documentation

## 2018-01-10 NOTE — Patient Instructions (Addendum)
You can decrease Dulera to 1 puff twice a day  In the month consider hold Dulera.    However you can restart it if you feel that you were doing better with [facility] twice a day.   Take albuterol anytime if you have cough or wheezing, this is your rescue inhaler.  Next visit for your physical exam around 09-2018

## 2018-01-10 NOTE — Assessment & Plan Note (Signed)
Asthma: Doing great with Dulera 2 puffs B.I.D..  Due to a misunderstanding he is also doing albuterol twice a day. Plan: Decrease Dulera to 1 puff B.I.D., then hold it if possible and restart if has frequent sx.  Albuterol will remain as his rescue inhaler to be used as needed only. RTC 09-2018 CPX

## 2018-01-10 NOTE — Progress Notes (Signed)
Subjective:    Patient ID: Cameron [name], male    DOB: 1946/07/27, 72 y.o.   MRN: [id number]  DOS:  01/10/2018 Type of visit - description : f/u Interval history: Since the last visit, he is doing much better. Taking Dulera as prescribed, due to misunderstanding he is also using albuterol twice a day.   Review of Systems No fever chills He is playing tennis without any difficulties, rarely has cough with occasional sputum production No GERD symptoms  Past Medical History:  Diagnosis Date  . Arthritis    degenerative joint disease, knees   . Constipation due to pain medication   . Diverticulosis of colon   . Fatty liver   . GERD (gastroesophageal reflux disease)    takes Prevacid  . Glaucoma (increased eye pressure)   . HTN (hypertension)   . Iron deficiency anemia    h/o  . Keloid of skin   . Lumbar spondylosis    possible right sided radiculopathy with hx of surgery by Dr. Cyndy Freeze in 2003  . Osteopenia    DEXA 12-2006    . Personal history of colonic polyps 2002   TUBULAR ADENOMA  . Prostatitis, acute 04-2011   admitted to [facility]  . Sleep apnea    on CPAP, last study [facility]  . Tick bite     Past Surgical History:  Procedure Laterality Date  . BACK SURGERY  2003  . CHOLECYSTECTOMY  2003  . COLONOSCOPY    . LAPAROSCOPIC APPENDECTOMY N/A 01/16/2016   Procedure: APPENDECTOMY LAPAROSCOPIC;  Surgeon: Fanny Skates, MD;  Location: [place];  Service: General;  Laterality: N/A;  . LUMBAR LAMINECTOMY/DECOMPRESSION MICRODISCECTOMY N/A 08/22/2015   Procedure: Lumbar two to lumbar three lumbar three to lumbar four microdiskectomy;  Surgeon: Ashok Pall, MD;  Location: [place] [facility];  Service: Neurosurgery;  Laterality: N/A;  L23 L34 microdiskectomy  . TOTAL KNEE ARTHROPLASTY Right 06/11/2014   Procedure: TOTAL KNEE ARTHROPLASTY;  Surgeon: Hessie Dibble, MD;  Location: [place];  Service: Orthopedics;  Laterality: Right;  . TOTAL KNEE ARTHROPLASTY Left 10/08/2014   DR DALLDORF    . TOTAL KNEE ARTHROPLASTY Left 10/08/2014   Procedure: TOTAL LEFT KNEE ARTHROPLASTY;  Surgeon: Hessie Dibble, MD;  Location: [place];  Service: Orthopedics;  Laterality: Left;    Social History   Socioeconomic History  . Marital status: Married    Spouse name: Not on file  . Number of children: 2  . Years of education: Not on file  . Highest education level: Not on file  Social Needs  . Financial resource strain: Not on file  . Food insecurity - worry: Not on file  . Food insecurity - inability: Not on file  . Transportation needs - medical: Not on file  . Transportation needs - non-medical: Not on file  Occupational History  . Occupation: retired--works in [place] Use  . Smoking status: Never Smoker  . Smokeless tobacco: Never Used  Substance and Sexual Activity  . Alcohol use: Yes    Comment: most at 1/month  . Drug use: No  . Sexual activity: Not on file  Other Topics Concern  . Not on file  Social History Narrative   Household- pt and wife   plays tennis sometimes       Allergies as of 01/10/2018      Reactions   Chlorhexidine Gluconate Hives, Other (See Comments)   Red rash developed on upper chest   Ciprofloxacin Hcl Itching  Clindamycin Itching, Rash      Medication List        Accurate as of 01/10/18  6:19 PM. Always use your most recent med list.          albuterol 108 (90 Base) MCG/ACT inhaler Commonly known as:  PROVENTIL HFA;VENTOLIN HFA Inhale 1-2 puffs into the lungs every 6 (six) hours as needed for wheezing or shortness of breath.   aspirin 81 MG tablet Take 81 mg by mouth daily.   folic acid 1 MG tablet Commonly known as:  FOLVITE Take 1 tablet (1 mg total) by mouth daily.   latanoprost 0.005 % ophthalmic solution Commonly known as:  XALATAN Place 1 drop into both eyes at bedtime.   metoprolol succinate 25 MG 24 hr tablet Commonly known as:  TOPROL-XL TAKE ONE-HALF TABLET BY  MOUTH DAILY   mometasone-formoterol 200-5 MCG/ACT  Aero Commonly known as:  DULERA Inhale 2 puffs into the lungs 2 (two) times daily.   multivitamin with minerals Tabs tablet Take 1 tablet by mouth daily.   MYRBETRIQ 50 MG Tb24 tablet Generic drug:  mirabegron ER Take 50 mg by mouth daily.   vitamin C 1000 MG tablet Take 1,000 mg by mouth 2 (two) times daily.          Objective:   Physical Exam BP 130/78 (BP Location: Left Arm, Patient Position: Sitting, Cuff Size: Normal)   Pulse 67   Temp 97.6 F (36.4 C) (Oral)   Resp 16   Ht 6' (1.829 m)   Wt (!) 318 lb (144.2 kg)   SpO2 97%   BMI 43.13 kg/m  General:   Well developed, overweight appearing. NAD.  HEENT:  Normocephalic . Face symmetric, atraumatic Lungs:  CTA B Normal respiratory effort, no intercostal retractions, no accessory muscle use. Heart: RRR,  no murmur.  No pretibial edema bilaterally  Skin: Not pale. Not jaundice Neurologic:  alert & oriented X3.  Speech normal, gait appropriate for age and unassisted Psych--  Cognition and judgment appear intact.  Cooperative with normal attention span and concentration.  Behavior appropriate. No anxious or depressed appearing.      Assessment & Plan:     Assessment Hypertension OSA on CPAP GI: GERD, Fatty liver Asthma, previously RAD (occ wheezing w/ URIs, inhalers) DJD: lumbar spondylosis, back surgery 2003, knee replacements GU: --LUTS  Dr Venia Minks ([facility]) --h/o prostatitis admitted 2012 [facility] Glaucoma Osteopenia per DEXA 2008, multiple attempts to redo bone density test failed.  h/o iron deficiency  H/o  B12 and vit D def, 2016 levels wnl  PLAN:   Asthma: Doing great with Dulera 2 puffs B.I.D..  Due to a misunderstanding he is also doing albuterol twice a day. Plan: Decrease Dulera to 1 puff B.I.D., then hold it if possible and restart if has frequent sx.  Albuterol will remain as his rescue inhaler to be used as needed only. RTC 09-2018 CPX

## 2018-01-10 NOTE — Progress Notes (Signed)
Pre visit review using our clinic review tool, if applicable. No additional management support is needed unless otherwise documented below in the visit note. 

## 2018-03-02 DIAGNOSIS — H04123 Dry eye syndrome of bilateral lacrimal glands: Secondary | ICD-10-CM | POA: Diagnosis not present

## 2018-03-02 DIAGNOSIS — H401131 Primary open-angle glaucoma, bilateral, mild stage: Secondary | ICD-10-CM | POA: Diagnosis not present

## 2018-03-02 DIAGNOSIS — H2513 Age-related nuclear cataract, bilateral: Secondary | ICD-10-CM | POA: Diagnosis not present

## 2018-07-03 ENCOUNTER — Other Ambulatory Visit: Payer: Self-pay

## 2018-07-03 ENCOUNTER — Ambulatory Visit: Payer: Self-pay | Admitting: Internal Medicine

## 2018-07-03 ENCOUNTER — Ambulatory Visit: Payer: Medicare Other | Admitting: Medical

## 2018-07-03 ENCOUNTER — Encounter: Payer: Self-pay | Admitting: Medical

## 2018-07-03 ENCOUNTER — Ambulatory Visit (HOSPITAL_BASED_OUTPATIENT_CLINIC_OR_DEPARTMENT_OTHER)
Admission: RE | Admit: 2018-07-03 | Discharge: 2018-07-03 | Disposition: A | Discharge status: Home / Self Care | Payer: Medicare Other | Source: Ambulatory Visit | Attending: Medical | Admitting: Medical

## 2018-07-03 VITALS — BP 154/84 | HR 84 | Temp 98.2°F | Resp 20 | Ht 72.0 in | Wt 327.0 lb

## 2018-07-03 DIAGNOSIS — R42 Dizziness and giddiness: Secondary | ICD-10-CM | POA: Diagnosis not present

## 2018-07-03 DIAGNOSIS — R5383 Other fatigue: Secondary | ICD-10-CM | POA: Diagnosis not present

## 2018-07-03 DIAGNOSIS — I517 Cardiomegaly: Secondary | ICD-10-CM | POA: Diagnosis not present

## 2018-07-03 DIAGNOSIS — S46819S Strain of other muscles, fascia and tendons at shoulder and upper arm level, unspecified arm, sequela: Secondary | ICD-10-CM | POA: Diagnosis not present

## 2018-07-03 DIAGNOSIS — R059 Cough, unspecified: Secondary | ICD-10-CM

## 2018-07-03 DIAGNOSIS — R062 Wheezing: Secondary | ICD-10-CM | POA: Insufficient documentation

## 2018-07-03 DIAGNOSIS — R05 Cough: Secondary | ICD-10-CM | POA: Insufficient documentation

## 2018-07-03 DIAGNOSIS — M546 Pain in thoracic spine: Secondary | ICD-10-CM | POA: Diagnosis not present

## 2018-07-03 DIAGNOSIS — M25512 Pain in left shoulder: Secondary | ICD-10-CM | POA: Diagnosis not present

## 2018-07-03 LAB — TROPONIN I: TNIDX: 0.01 ug/l (ref 0.00–0.06)

## 2018-07-03 MED ORDER — ALBUTEROL SULFATE HFA 108 (90 BASE) MCG/ACT IN AERS: 1.0000 | INHALATION_SPRAY | Freq: Four times a day (QID) | RESPIRATORY_TRACT | 5 refills | Status: DC | PRN

## 2018-07-03 MED ORDER — MOMETASONE FURO-FORMOTEROL FUM 200-5 MCG/ACT IN AERO: 2.0000 | INHALATION_SPRAY | Freq: Two times a day (BID) | RESPIRATORY_TRACT | 2 refills | Status: DC

## 2018-07-03 MED ORDER — PREDNISONE 10 MG PO TABS: 10.0000 mg | ORAL_TABLET | Freq: Every day | ORAL | 0 refills | Status: DC

## 2018-07-03 MED ORDER — BENZONATATE 100 MG PO CAPS: 100.0000 mg | ORAL_CAPSULE | Freq: Three times a day (TID) | ORAL | 0 refills | Status: DC | PRN

## 2018-07-03 MED ORDER — AZITHROMYCIN 250 MG PO TABS: ORAL_TABLET | ORAL | 0 refills | Status: DC

## 2018-07-03 NOTE — Patient Instructions (Signed)
For your recent left-sided thoracic region back pain and right sided trapezius pain, I think this may be related to your helping out at the food bank.  However sometimes back pain can be partial presentation of infectious process such as bronchitis or pneumonia.  Probable bronchitis versus possible pneumonia.  Your recent productive cough and wheezing causes suspicion for either of these diagnoses.  But will get chest x-ray and see if pneumonia is present.  Did go ahead and prescribe a azithromycin antibiotic and benzonatate cough tablets.  Also prescribed Dulera, albuterol and 5-day tapered prednisone.  For your brief lightheaded sensation on Friday of this past week, decided to go ahead and add metabolic panel.  Your EKG looked normal sinus rhythm today.  No changes from prior EKG.  Decided to go ahead and get BNP and troponin to evaluate for the remote possible differential diagnoses.  Did this in light of worse wheezing as well as thoracic back pain.  Follow-up in 7 days or as needed.  We will notify you of the above work-up results and if her symptoms worsen would expand work-up.  Any severe change in symptoms after hours and recommend ED evaluation.

## 2018-07-03 NOTE — Telephone Encounter (Signed)
He called in c/o chest discomfort in left side of my chest that goes around into my back.  He told me at the end of the call he was having wheezing also.   Had pneumonia back in December and these were the symptoms he had then except the chest discomfort.  Per protocol he could be seen in the office if the PCP was ok with that.    I called the flow coordinator at [facility] and spoke with Dillon.   They were ok with him seeing Cameron Pai, PA-C because Dr. Larose Kells was not available, with the understanding he may be referred to the ED.    Pt was agreeable to this. Per [name]'s scheduling preferences he wants a 30 minute appt for acute chest pain.   I called back to the practice and spoke with [facility].   Permission was given to schedule a 40 minute appt.   I scheduled pt with Cameron [name] for today at 1:20 for a 40 minute appt.  I gave him instructions on s/s to look for to go to the ED instead of waiting to come in for this appt.  He verbalized understanding of these instructions.    Reason for Disposition . Patient sounds very sick or weak to the triager  Answer Assessment - Initial Assessment Questions 1. LOCATION: "Where does it hurt?"       On left side more to the back of my chest.   I'm having issues with my shoulders.   Both shoulders.   I've got a lot of congestion.    The pain in right shoulder started last week.   I thought it was way I was sleeping.   I took Advil but it didn't really help.   Now it's in my left shoulder too. 2. RADIATION: "Does the pain go anywhere else?" (e.g., into neck, jaw, arms, back)     See above. 3. ONSET: "When did the chest pain begin?" (Minutes, hours or days)      On Sunday the chest discomfort started. 4. PATTERN "Does the pain come and go, or has it been constant since it started?"  "Does it get worse with exertion?"      It comes and goes.     5. DURATION: "How long does it last" (e.g., seconds, minutes, hours)     Most of it is positional.   6. SEVERITY: "How bad is the pain?"  (e.g., Scale 1-10; mild, moderate, or severe)    - MILD (1-3): doesn't interfere with normal activities     - MODERATE (4-7): interferes with normal activities or awakens from sleep    - SEVERE (8-10): excruciating pain, unable to do any normal activities       More a discomfort like 4-5 range. 7. CARDIAC RISK FACTORS: "Do you have any history of heart problems or risk factors for heart disease?" (e.g., prior heart attack, angina; high blood pressure, diabetes, being overweight, high cholesterol, smoking, or strong family history of heart disease)     Hypertension on medications, Not a smoker.  Cholesterol low range.   No diabetes.   8. PULMONARY RISK FACTORS: "Do you have any history of lung disease?"  (e.g., blood clots in lung, asthma, emphysema, birth control pills)     No asthma.   I had pneumonia in December.   No blood clots in lungs. 9. CAUSE: "What do you think is causing the chest pain?"     I don't know.  I had the wheezing when I had pneumonia.    10. OTHER SYMPTOMS: "Do you have any other symptoms?" (e.g., dizziness, nausea, vomiting, sweating, fever, difficulty breathing, cough)       No fever, I felt lightheaded on Friday morning but not since.   No N/V.    I have congestion.   Using Mucinex for coughing it helped at first but not helping now.    Denies sore throat, or nasal congestion.    He is coughing as I'm triaging him.    Coughing up grayish mucus.   11. PREGNANCY: "Is there any chance you are pregnant?" "When was your last menstrual period?"       N/A  Protocols used: CHEST PAIN-A-AH

## 2018-07-03 NOTE — Telephone Encounter (Signed)
Reviewed but want to note he told me no chest pain.Please see my note.

## 2018-07-03 NOTE — Progress Notes (Signed)
Subjective:    Patient ID: Cameron [name], male    DOB: Aug 04, 1946, 72 y.o.   MRN: [id number]  HPI  Pt in with some mild left side upper thorax pain. Region between left scapula and parathoracic region. He notes this occurred just this weekend. Rt side trapezius slight pain for one week. This is still present and can make pain rt trapezius worse or more comfortable with position change of rt upper arm. On Wednesday he was helping at food pantry. Lifting moderate heavy amount of boxes.   He also notes slight light headed sensation for 20 minutes. Non associated chest pain or neurologic type symptoms.  Pt states chronic intermittent cough since December when he was diagnosed with pneumonia. He has been wheezing intermittently since last December. Cough that he has will sometimes be throughout the night. History of remote smoking a pipe in college. Pt was told to take dulera in February, given tapered prednisone and had albuterol. Coughing that is productive over past week. Wife states she hears him on and off wheezing when she sits close to him.  Review of Systems  Constitutional: Negative for chills, fatigue and fever.  HENT: Negative for congestion, ear discharge and ear pain.   Respiratory: Positive for cough and wheezing. Negative for chest tightness and shortness of breath.   Cardiovascular: Negative for leg swelling.  Musculoskeletal:       See hpi left side upper back pain and rt trapezius pain.  Neurological: Negative for syncope, facial asymmetry, speech difficulty and weakness.  Hematological: Negative for adenopathy. Does not bruise/bleed easily.  Psychiatric/Behavioral: Negative for behavioral problems, confusion, decreased concentration and self-injury. The patient is not nervous/anxious.    Past Medical History:  Diagnosis Date  . Arthritis    degenerative joint disease, knees   . Constipation due to pain medication   . Diverticulosis of colon   . Fatty liver   . GERD  (gastroesophageal reflux disease)    takes Prevacid  . Glaucoma (increased eye pressure)   . HTN (hypertension)   . Iron deficiency anemia    h/o  . Keloid of skin   . Lumbar spondylosis    possible right sided radiculopathy with hx of surgery by Dr. Cyndy Freeze in 2003  . Osteopenia    DEXA 12-2006    . Personal history of colonic polyps 2002   TUBULAR ADENOMA  . Prostatitis, acute 04-2011   [facility]  . Sleep apnea    on CPAP, last study [facility]  . Tick bite      Social History   Socioeconomic History  . Marital status: Married    Spouse name: Not on file  . Number of children: 2  . Years of education: Not on file  . Highest education level: Not on file  Occupational History  . Occupation: retired--works in [place]  . Financial resource strain: Not on file  . Food insecurity:    Worry: Not on file    Inability: Not on file  . Transportation needs:    Medical: Not on file    Non-medical: Not on file  Tobacco Use  . Smoking status: Never Smoker  . Smokeless tobacco: Never Used  Substance and Sexual Activity  . Alcohol use: Yes    Comment: most at 1/month  . Drug use: No  . Sexual activity: Not on file  Lifestyle  . Physical activity:    Days per week: Not on file    Minutes  per session: Not on file  . Stress: Not on file  Relationships  . Social connections:    Talks on phone: Not on file    Gets together: Not on file    Attends religious service: Not on file    Active member of club or organization: Not on file    Attends meetings of clubs or organizations: Not on file    Relationship status: Not on file  . Intimate partner violence:    Fear of current or ex partner: Not on file    Emotionally abused: Not on file    Physically abused: Not on file    Forced sexual activity: Not on file  Other Topics Concern  . Not on file  Social History Narrative   Household- pt and wife   plays tennis sometimes     Past Surgical History:  Procedure  Laterality Date  . BACK SURGERY  2003  . CHOLECYSTECTOMY  2003  . COLONOSCOPY    . LAPAROSCOPIC APPENDECTOMY N/A 01/16/2016   Procedure: APPENDECTOMY LAPAROSCOPIC;  Surgeon: Fanny Skates, MD;  Location: [place];  Service: General;  Laterality: N/A;  . LUMBAR LAMINECTOMY/DECOMPRESSION MICRODISCECTOMY N/A 08/22/2015   Procedure: Lumbar two to lumbar three lumbar three to lumbar four microdiskectomy;  Surgeon: Ashok Pall, MD;  Location: [place] [facility];  Service: Neurosurgery;  Laterality: N/A;  L23 L34 microdiskectomy  . TOTAL KNEE ARTHROPLASTY Right 06/11/2014   Procedure: TOTAL KNEE ARTHROPLASTY;  Surgeon: Hessie Dibble, MD;  Location: [place];  Service: Orthopedics;  Laterality: Right;  . TOTAL KNEE ARTHROPLASTY Left 10/08/2014   DR DALLDORF  . TOTAL KNEE ARTHROPLASTY Left 10/08/2014   Procedure: TOTAL LEFT KNEE ARTHROPLASTY;  Surgeon: Hessie Dibble, MD;  Location: [place];  Service: Orthopedics;  Laterality: Left;    Family History  Problem Relation Age of Onset  . Dementia Mother   . Diverticulitis Father   . Colon cancer Maternal Uncle        dx age 40s  . Colon cancer Other        grandfather, dx age 24  . Stroke Other        maternal aunt in her 58s  . Breast cancer Maternal Aunt   . Prostate cancer Neg Hx   . Heart attack Neg Hx   . Hypertension Neg Hx   . Diabetes Neg Hx     Allergies  Allergen Reactions  . Chlorhexidine Gluconate Hives and Other (See Comments)    Red rash developed on upper chest   . Ciprofloxacin Hcl Itching  . Clindamycin Itching and Rash    Current Outpatient Medications on File Prior to Visit  Medication Sig Dispense Refill  . Ascorbic Acid (VITAMIN C) 1000 MG tablet Take 1,000 mg by mouth 2 (two) times daily.     Marland Kitchen aspirin 81 MG tablet Take 81 mg by mouth daily.    . brimonidine (ALPHAGAN) 0.2 % ophthalmic solution 1 drop daily.    . folic acid (FOLVITE) 1 MG tablet Take 1 tablet (1 mg total) by mouth daily. 90 tablet 2  . latanoprost  (XALATAN) 0.005 % ophthalmic solution Place 1 drop into both eyes at bedtime.     . metoprolol succinate (TOPROL-XL) 25 MG 24 hr tablet TAKE ONE-HALF TABLET BY  MOUTH DAILY 45 tablet 3  . Multiple Vitamin (MULTIVITAMIN WITH MINERALS) TABS tablet Take 1 tablet by mouth daily.    Marland Kitchen MYRBETRIQ 50 MG TB24 tablet Take 50 mg by mouth daily.     Marland Kitchen  albuterol (PROVENTIL HFA;VENTOLIN HFA) 108 (90 Base) MCG/ACT inhaler Inhale 1-2 puffs into the lungs every 6 (six) hours as needed for wheezing or shortness of breath. (Patient not taking: Reported on 07/03/2018) 18 g 5  . mometasone-formoterol (DULERA) 200-5 MCG/ACT AERO Inhale 2 puffs into the lungs 2 (two) times daily. (Patient not taking: Reported on 07/03/2018) 1 Inhaler 2   No current facility-administered medications on file prior to visit.     BP (!) 154/84 (BP Location: Left Arm)   Pulse 84   Temp 98.2 F (36.8 C) (Oral)   Resp 20   Ht 6' (1.829 m)   Wt (!) 327 lb (148.3 kg)   SpO2 97%   BMI 44.35 kg/m       Objective:   Physical Exam  General  Mental Status - Alert. General Appearance - Well groomed. Not in acute distress.  Skin Rashes- No Rashes.  HEENT Head- Normal. Ear Auditory Canal - Left- Normal. Right - Normal.Tympanic Membrane- Left- Normal. Right- Normal. Eye Sclera/Conjunctiva- Left- Normal. Right- Normal. Nose & Sinuses Nasal Mucosa- Left-  Not boggy or Congested. Right-  Not  boggy or Congested. Mouth & Throat Lips: Upper Lip- Normal: no dryness, cracking, pallor, cyanosis, or vesicular eruption. Lower Lip-Normal: no dryness, cracking, pallor, cyanosis or vesicular eruption. Buccal Mucosa- Bilateral- No Aphthous ulcers. Oropharynx- No Discharge or Erythema. Tonsils: Characteristics- Bilateral- No Erythema or Congestion. Size/Enlargement- Bilateral- No enlargement. Discharge- bilateral-None.  Neck Neck- Supple. No Masses.   Chest and Lung Exam Auscultation: Breath Sounds:- even and unlabored, but bilateral upper  lobe rhonchi.  Cardiovascular Auscultation:Rythm- Regular, rate and rhythm. Murmurs & Other Heart Sounds:Ausculatation of the heart reveal- No Murmurs.  Lymphatic Head & Neck General Head & Neck Lymphatics: Bilateral: Description- No Localized lymphadenopathy.  Lower ext- calfs symmetric, negative homans signs, no pedal edema.       Assessment & Plan:  For your recent left-sided thoracic region back pain and right sided trapezius pain, I think this may be related to your helping out at the food bank.  However sometimes back pain can be partial presentation of infectious process such as bronchitis or pneumonia.  Probable bronchitis versus possible pneumonia.  Your recent productive cough and wheezing causes suspicion for either of these diagnoses.  But will get chest x-ray and see if pneumonia is present.  Did go ahead and prescribe a azithromycin antibiotic and benzonatate cough tablets.  Also prescribed Dulera, albuterol and 5-day tapered prednisone.  For your brief lightheaded sensation on Friday of this past week, decided to go ahead and add metabolic panel.  Your EKG looked normal sinus rhythm today.  No changes from prior EKG.  Decided to go ahead and get BNP and troponin to evaluate for the remote possible differential diagnoses.  Did this in light of worse wheezing as well as thoracic back pain.  Follow-up in 7 days or as needed.  We will notify you of the above work-up results and if her symptoms worsen would expand work-up.  Any severe change in symptoms after hours and recommend ED evaluation.  40 minutes spend with pt. 50% time spent counseling with patient on how we would approach work up going forward. Extra time counseling since on initial call to office there was mention of chest pain and then shoulder pain to [facility] center. Pt is new to me and needed to take time to clarify his current symptoms.   Mackie Pai, PA-C

## 2018-07-04 LAB — COMPREHENSIVE METABOLIC PANEL
ALT: 17 U/L (ref 0–53)
AST: 15 U/L (ref 0–37)
Albumin: 4.2 g/dL (ref 3.5–5.2)
Alkaline Phosphatase: 83 U/L (ref 39–117)
BUN: 10 mg/dL (ref 6–23)
CO2: 35 mEq/L — ABNORMAL HIGH (ref 19–32)
Calcium: 9.6 mg/dL (ref 8.4–10.5)
Chloride: 102 mEq/L (ref 96–112)
Creatinine, Ser: 0.94 mg/dL (ref 0.40–1.50)
GFR: 102.11 mL/min (ref >60.00)
Glucose, Bld: 76 mg/dL (ref 70–99)
Potassium: 4.2 mEq/L (ref 3.5–5.1)
Sodium: 141 mEq/L (ref 135–145)
Total Bilirubin: 1.2 mg/dL (ref 0.2–1.2)
Total Protein: 6.8 g/dL (ref 6.0–8.3)

## 2018-07-04 LAB — CBC WITH DIFFERENTIAL/PLATELET
Basophils Absolute: 0 10*3/uL (ref 0.0–0.1)
Basophils Relative: 0.5 % (ref 0.0–3.0)
Eosinophils Absolute: 0.2 10*3/uL (ref 0.0–0.7)
Eosinophils Relative: 3.3 % (ref 0.0–5.0)
HCT: 41.1 % (ref 39.0–52.0)
Hemoglobin: 13.8 g/dL (ref 13.0–17.0)
Lymphocytes Relative: 28.3 % (ref 12.0–46.0)
Lymphs Abs: 1.3 10*3/uL (ref 0.7–4.0)
MCHC: 33.5 g/dL (ref 30.0–36.0)
MCV: 89.2 fl (ref 78.0–100.0)
Monocytes Absolute: 0.8 10*3/uL (ref 0.1–1.0)
Monocytes Relative: 16.4 % — ABNORMAL HIGH (ref 3.0–12.0)
Neutro Abs: 2.4 10*3/uL (ref 1.4–7.7)
Neutrophils Relative %: 51.5 % (ref 43.0–77.0)
Platelets: 185 10*3/uL (ref 150.0–400.0)
RBC: 4.61 Mil/uL (ref 4.22–5.81)
RDW: 13.8 % (ref 11.5–15.5)
WBC: 4.7 10*3/uL (ref 4.0–10.5)

## 2018-07-04 LAB — BRAIN NATRIURETIC PEPTIDE: Pro B Natriuretic peptide (BNP): 49 pg/mL (ref 0.0–100.0)

## 2018-07-13 ENCOUNTER — Ambulatory Visit: Payer: Medicare Other | Admitting: Family Medicine

## 2018-07-13 ENCOUNTER — Encounter: Payer: Self-pay | Admitting: Family Medicine

## 2018-07-13 ENCOUNTER — Telehealth: Payer: Self-pay

## 2018-07-13 VITALS — BP 132/84 | HR 84 | Temp 98.3°F | Resp 18 | Ht 72.0 in | Wt 327.0 lb

## 2018-07-13 DIAGNOSIS — I517 Cardiomegaly: Secondary | ICD-10-CM

## 2018-07-13 DIAGNOSIS — R05 Cough: Secondary | ICD-10-CM

## 2018-07-13 DIAGNOSIS — R059 Cough, unspecified: Secondary | ICD-10-CM

## 2018-07-13 MED ORDER — PREDNISONE 20 MG PO TABS: ORAL_TABLET | ORAL | 0 refills | Status: DC

## 2018-07-13 NOTE — Telephone Encounter (Signed)
Patient's wife came in for appointment today to see Dr. Lorelei Pont for congestion. He came to appointment with wife and requested to be seen. Dr. Lorelei Pont is also seeing him in the office now for a follow up on his chest congestion/chest xray he saw edward for last month. He is no better. I asked him if he wanted me to cancel his appointment with pcp on Tuesday he would like to keep it at 10:40 but would like a physical instead of just follow up. Can you please override your restrictions to allow this patient to have a physical or no?

## 2018-07-13 NOTE — Telephone Encounter (Signed)
Can you please override

## 2018-07-13 NOTE — Patient Instructions (Addendum)
It was good to see you today-  I will set you up for a cardiac ultrasound to evaluate the size of your heart I gave you a few more days of prednisone to fill and use if need be  If your shoulder continues to be an issue please see Dr. Latanya Maudlin as needed

## 2018-07-13 NOTE — Progress Notes (Signed)
[facility] [address], [place], [place] [address] [phone]  Date:  07/13/2018   Name:  Cameron [name]   DOB:  May 19, 1946   MRN:  [id number]  PCP:  Cameron Branch, MD    Chief Complaint: URI (given tessalon pearls, antibioitc, inhaler for pneumonia?-enlarged heart on xray coughing white phlem, previous rx of pneumonia)   History of Present Illness:  Cameron [name] is a 72 y.o. very pleasant male patient who presents with the following:  Pt of Dr. Larose [name], here today for a sick visit with his wife who is also ill History of OSA on CPAP, obesity, HTN  Pt reports he does NOT have a history of asthma   He was in about 10 days ago and saw Cameron [name] He was treated with azithromycin, pred for 5 days  He was given an rx for dulera  He is also using albuterol prn  Cameron Cameron [name] feels like he is getting better and had an appt to see Dr. Larose [name] next week for a recheck; however his wife wanted him to be seen today due to concern about his wheezing.  Pt does not feel like he is wheezing however  Dg Chest 2 View  Result Date: 07/03/2018 CLINICAL DATA:  Pneumonia, neck and cough. EXAM: CHEST - 2 VIEW COMPARISON:  10/30/2017 FINDINGS: The heart is enlarged. There is chronic elevation of the RIGHT hemidiaphragm. No definite consolidation or edema. No acute osseous findings. Compared with priors, the slight retrocardiac density is no longer visible. IMPRESSION: Improved aeration. Cardiomegaly. Chronic RIGHT hemidiaphragm elevation. No active infiltrates or failure. Electronically Signed   By: Cameron [name] M.D.   On: 07/03/2018 15:12   Discussed his CXR findings.  Elevated right hemidiaphragm is long standing.  He had not heard about cardiomegaly in the past so will obtain echo for him   Patient Active Problem List   Diagnosis Date Noted  . Asthma 01/10/2018  . Lumbar stenosis 08/22/2015  . PCP NOTES >>>>> 07/28/2015  . Primary  osteoarthritis of left knee 10/08/2014  . Primary osteoarthritis of knee 10/08/2014  . Abnormal prostate specific antigen 08/16/2014  . Detrusor muscle hypertonia 08/16/2014  . Right knee DJD 06/11/2014  . Morbid obesity ([place]) 06/11/2014  . Cubital tunnel syndrome 05/19/2014  . Annual physical exam 03/30/2012  . B12 deficiency 03/30/2012  . Osteopenia, low vit D 03/30/2012  . Fatty liver 04/16/2011  . Esophageal reflux 04/16/2011  . Obesity 04/16/2011  . OSA on CPAP 03/13/2009  . Essential hypertension 08/02/2007  . DIVERTICULOSIS, Cameron 08/02/2007    Past Medical History:  Diagnosis Date  . Arthritis    degenerative joint disease, knees   . Constipation due to pain medication   . Diverticulosis of Cameron   . Fatty liver   . GERD (gastroesophageal reflux disease)    takes Prevacid  . Glaucoma (increased eye pressure)   . HTN (hypertension)   . Iron deficiency anemia    h/o  . Keloid of skin   . Lumbar spondylosis    possible right sided radiculopathy with hx of surgery by Dr. Cyndy Freeze in 2003  . Osteopenia    DEXA 12-2006    . Personal history of colonic polyps 2002   TUBULAR ADENOMA  . Prostatitis, acute 04-2011   admitted to [facility]  . Sleep apnea    on CPAP, last study [facility]  . Tick bite     Past  Surgical History:  Procedure Laterality Date  . BACK SURGERY  2003  . CHOLECYSTECTOMY  2003  . COLONOSCOPY    . LAPAROSCOPIC APPENDECTOMY N/A 01/16/2016   Procedure: APPENDECTOMY LAPAROSCOPIC;  Surgeon: Fanny Skates, MD;  Location: [place];  Service: General;  Laterality: N/A;  . LUMBAR LAMINECTOMY/DECOMPRESSION MICRODISCECTOMY N/A 08/22/2015   Procedure: Lumbar two to lumbar three lumbar three to lumbar four microdiskectomy;  Surgeon: Ashok Pall, MD;  Location: [place] [facility];  Service: Neurosurgery;  Laterality: N/A;  L23 L34 microdiskectomy  . TOTAL KNEE ARTHROPLASTY Right 06/11/2014   Procedure: TOTAL KNEE ARTHROPLASTY;  Surgeon: Hessie Dibble, MD;  Location: [place];   Service: Orthopedics;  Laterality: Right;  . TOTAL KNEE ARTHROPLASTY Left 10/08/2014   DR DALLDORF  . TOTAL KNEE ARTHROPLASTY Left 10/08/2014   Procedure: TOTAL LEFT KNEE ARTHROPLASTY;  Surgeon: Hessie Dibble, MD;  Location: [place];  Service: Orthopedics;  Laterality: Left;    Social History   Tobacco Use  . Smoking status: Never Smoker  . Smokeless tobacco: Never Used  Substance Use Topics  . Alcohol use: Yes    Comment: most at 1/month  . Drug use: No    Family History  Problem Relation Age of Onset  . Dementia Mother   . Diverticulitis Father   . Cameron cancer Maternal Uncle        dx age 47s  . Cameron cancer Other        grandfather, dx age 81  . Stroke Other        maternal aunt in her 82s  . Breast cancer Maternal Aunt   . Prostate cancer Neg Hx   . Heart attack Neg Hx   . Hypertension Neg Hx   . Diabetes Neg Hx     Allergies  Allergen Reactions  . Chlorhexidine Gluconate Hives and Other (See Comments)    Red rash developed on upper chest   . Ciprofloxacin Hcl Itching  . Clindamycin Itching and Rash    Medication list has been reviewed and updated.  Current Outpatient Medications on File Prior to Visit  Medication Sig Dispense Refill  . albuterol (PROVENTIL HFA;VENTOLIN HFA) 108 (90 Base) MCG/ACT inhaler Inhale 1-2 puffs into the lungs every 6 (six) hours as needed for wheezing or shortness of breath. 18 g 5  . Ascorbic Acid (VITAMIN C) 1000 MG tablet Take 1,000 mg by mouth 2 (two) times daily.     Marland Kitchen aspirin 81 MG tablet Take 81 mg by mouth daily.    . benzonatate (TESSALON) 100 MG capsule Take 1 capsule (100 mg total) by mouth 3 (three) times daily as needed for cough. 30 capsule 0  . brimonidine (ALPHAGAN) 0.2 % ophthalmic solution 1 drop daily.    . folic acid (FOLVITE) 1 MG tablet Take 1 tablet (1 mg total) by mouth daily. 90 tablet 2  . latanoprost (XALATAN) 0.005 % ophthalmic solution Place 1 drop into both eyes at bedtime.     . metoprolol succinate  (TOPROL-XL) 25 MG 24 hr tablet TAKE ONE-HALF TABLET BY  MOUTH DAILY 45 tablet 3  . mometasone-formoterol (DULERA) 200-5 MCG/ACT AERO Inhale 2 puffs into the lungs 2 (two) times daily. 1 Inhaler 2  . Multiple Vitamin (MULTIVITAMIN WITH MINERALS) TABS tablet Take 1 tablet by mouth daily.    Marland Kitchen MYRBETRIQ 50 MG TB24 tablet Take 50 mg by mouth daily.     . predniSONE (DELTASONE) 10 MG tablet Take 1 tablet (10 mg total) by mouth daily  with breakfast. 5 TAB PO DAY 1 4 TAB PO DAY 2 3 TAB PO DAY 3 2 TAB PO DAY 4 1 TAB PO DAY 5 15 tablet 0   No current facility-administered medications on file prior to visit.     Review of Systems:  As per HPI- otherwise negative. No history of DM   Physical Examination: Vitals:   07/13/18 1616  BP: 132/84  Pulse: 84  Resp: 18  Temp: 98.3 F (36.8 C)  SpO2: 98%   Vitals:   07/13/18 1616  Weight: (!) 327 lb (148.3 kg)  Height: 6' (1.829 m)   Body mass index is 44.35 kg/m. Ideal Body Weight: Weight in (lb) to have BMI = 25: 183.9  GEN: WDWN, NAD, Non-toxic, A & O x 3, obese, looks well  HEENT: Atraumatic, Normocephalic. Neck supple. No masses, No LAD.  Bilateral TM wnl, oropharynx normal.  PEERL,EOMI.   Ears and Nose: No external deformity. CV: RRR, No M/G/R. No JVD. No thrill. No extra heart sounds. PULM: CTA B, no wheezes, crackles, rhonchi. No retractions. No resp. distress. No accessory muscle use. ABD: S, NT, ND, +BS. No rebound. No HSM. EXTR: No c/c/e NEURO Normal gait.  PSYCH: Normally interactive. Conversant. Not depressed or anxious appearing.  Calm demeanor.    Assessment and Plan: Cardiomegaly - Plan: ECHOCARDIOGRAM COMPLETE  Cough - Plan: predniSONE (DELTASONE) 20 MG tablet  Recheck visit today Cameron [name] is feeling better and does not think he likely needs anything further as far as his cough Did give him an rx for 5 days more of prednisone to hold and fill if needed Will set up echo to eval for cardiomegaly He will let  me know if any concerns   Signed Lamar Blinks, MD

## 2018-07-13 NOTE — Telephone Encounter (Signed)
Oc for cpx

## 2018-07-14 DIAGNOSIS — H401131 Primary open-angle glaucoma, bilateral, mild stage: Secondary | ICD-10-CM | POA: Diagnosis not present

## 2018-07-14 DIAGNOSIS — H2513 Age-related nuclear cataract, bilateral: Secondary | ICD-10-CM | POA: Diagnosis not present

## 2018-07-14 DIAGNOSIS — H04123 Dry eye syndrome of bilateral lacrimal glands: Secondary | ICD-10-CM | POA: Diagnosis not present

## 2018-07-18 ENCOUNTER — Encounter: Payer: Self-pay | Admitting: Internal Medicine

## 2018-07-18 ENCOUNTER — Ambulatory Visit: Payer: Medicare Other | Admitting: Internal Medicine

## 2018-07-18 VITALS — BP 134/78 | HR 68 | Temp 98.0°F | Resp 14 | Ht 72.0 in | Wt 325.2 lb

## 2018-07-18 DIAGNOSIS — J45901 Unspecified asthma with (acute) exacerbation: Secondary | ICD-10-CM

## 2018-07-18 MED ORDER — MOMETASONE FURO-FORMOTEROL FUM 200-5 MCG/ACT IN AERO: 2.0000 | INHALATION_SPRAY | Freq: Two times a day (BID) | RESPIRATORY_TRACT | 5 refills | Status: DC

## 2018-07-18 NOTE — Progress Notes (Signed)
Pre visit review using our clinic review tool, if applicable. No additional management support is needed unless otherwise documented below in the visit note. 

## 2018-07-18 NOTE — Patient Instructions (Signed)
See you in November  Use [facility] for 3 additional weeks

## 2018-07-18 NOTE — Progress Notes (Signed)
Subjective:    Patient ID: Cameron [name], male    DOB: 1946/05/09, 72 y.o.   MRN: [id number]  DOS:  07/18/2018 Type of visit - description : rov Interval history:  Seen 07/03/2018 at this office, he had left-sided thoracic pain, right trapezius pain, cough.  Wheezing? Yes per pt's wife but pt did not think so Blood work unremarkable, chest x-ray with no acute findings. Was treated with prednisone, Zithromax for possible bronchitis. Subsequently was seen by Dr. Lorelei Pont 07/13/2018, she noted the cough was better, was prescribed some additional prednisone. Cardiomegaly was noted on CXR, she ordered a echocardiogram.    Wt Readings from Last 3 Encounters:  07/18/18 (!) 325 lb 4 oz (147.5 kg)  07/13/18 (!) 327 lb (148.3 kg)  07/03/18 (!) 327 lb (148.3 kg)     Review of Systems Since the last visit, he is doing better. Cough has decreased, no wheezing. No fever chills No chest pain or difficulty breathing. Had shoulder pain and it has improved   Past Medical History:  Diagnosis Date  . Arthritis    degenerative joint disease, knees   . Constipation due to pain medication   . Diverticulosis of colon   . Fatty liver   . GERD (gastroesophageal reflux disease)    takes Prevacid  . Glaucoma (increased eye pressure)   . HTN (hypertension)   . Iron deficiency anemia    h/o  . Keloid of skin   . Lumbar spondylosis    possible right sided radiculopathy with hx of surgery by Dr. Cyndy Freeze in 2003  . Osteopenia    DEXA 12-2006    . Personal history of colonic polyps 2002   TUBULAR ADENOMA  . Prostatitis, acute 04-2011   admitted to [facility]  . Sleep apnea    on CPAP, last study [facility]  . Tick bite     Past Surgical History:  Procedure Laterality Date  . BACK SURGERY  2003  . CHOLECYSTECTOMY  2003  . COLONOSCOPY    . LAPAROSCOPIC APPENDECTOMY N/A 01/16/2016   Procedure: APPENDECTOMY LAPAROSCOPIC;  Surgeon: Fanny Skates, MD;  Location: [place];  Service: General;  Laterality:  N/A;  . LUMBAR LAMINECTOMY/DECOMPRESSION MICRODISCECTOMY N/A 08/22/2015   Procedure: Lumbar two to lumbar three lumbar three to lumbar four microdiskectomy;  Surgeon: Ashok Pall, MD;  Location: [place] [facility];  Service: Neurosurgery;  Laterality: N/A;  L23 L34 microdiskectomy  . TOTAL KNEE ARTHROPLASTY Right 06/11/2014   Procedure: TOTAL KNEE ARTHROPLASTY;  Surgeon: Hessie Dibble, MD;  Location: [place];  Service: Orthopedics;  Laterality: Right;  . TOTAL KNEE ARTHROPLASTY Left 10/08/2014   DR DALLDORF  . TOTAL KNEE ARTHROPLASTY Left 10/08/2014   Procedure: TOTAL LEFT KNEE ARTHROPLASTY;  Surgeon: Hessie Dibble, MD;  Location: [place];  Service: Orthopedics;  Laterality: Left;    Social History   Socioeconomic History  . Marital status: Married    Spouse name: Not on file  . Number of children: 2  . Years of education: Not on file  . Highest education level: Not on file  Occupational History  . Occupation: retired--works in [place]  . Financial resource strain: Not on file  . Food insecurity:    Worry: Not on file    Inability: Not on file  . Transportation needs:    Medical: Not on file    Non-medical: Not on file  Tobacco Use  . Smoking status: Never Smoker  . Smokeless tobacco: Never Used  Substance and Sexual Activity  . Alcohol use: Yes    Comment: most at 1/month  . Drug use: No  . Sexual activity: Not on file  Lifestyle  . Physical activity:    Days per week: Not on file    Minutes per session: Not on file  . Stress: Not on file  Relationships  . Social connections:    Talks on phone: Not on file    Gets together: Not on file    Attends religious service: Not on file    Active member of club or organization: Not on file    Attends meetings of clubs or organizations: Not on file    Relationship status: Not on file  . Intimate partner violence:    Fear of current or ex partner: Not on file    Emotionally abused: Not on file    Physically abused: Not on  file    Forced sexual activity: Not on file  Other Topics Concern  . Not on file  Social History Narrative   Household- pt and wife   plays tennis sometimes       Allergies as of 07/18/2018      Reactions   Chlorhexidine Gluconate Hives, Other (See Comments)   Red rash developed on upper chest   Ciprofloxacin Hcl Itching   Clindamycin Itching, Rash      Medication List        Accurate as of 07/18/18 11:59 PM. Always use your most recent med list.          albuterol 108 (90 Base) MCG/ACT inhaler Commonly known as:  PROVENTIL HFA;VENTOLIN HFA Inhale 1-2 puffs into the lungs every 6 (six) hours as needed for wheezing or shortness of breath.   aspirin 81 MG tablet Take 81 mg by mouth daily.   brimonidine 0.2 % ophthalmic solution Commonly known as:  ALPHAGAN 1 drop daily.   folic acid 1 MG tablet Commonly known as:  FOLVITE Take 1 tablet (1 mg total) by mouth daily.   latanoprost 0.005 % ophthalmic solution Commonly known as:  XALATAN Place 1 drop into both eyes at bedtime.   metoprolol succinate 25 MG 24 hr tablet Commonly known as:  TOPROL-XL TAKE ONE-HALF TABLET BY  MOUTH DAILY   mometasone-formoterol 200-5 MCG/ACT Aero Commonly known as:  DULERA Inhale 2 puffs into the lungs 2 (two) times daily.   multivitamin with minerals Tabs tablet Take 1 tablet by mouth daily.   MYRBETRIQ 50 MG Tb24 tablet Generic drug:  mirabegron ER Take 50 mg by mouth daily.   vitamin C 1000 MG tablet Take 1,000 mg by mouth 2 (two) times daily.          Objective:   Physical Exam BP 134/78 (BP Location: Left Arm, Patient Position: Sitting, Cuff Size: Normal)   Pulse 68   Temp 98 F (36.7 C) (Oral)   Resp 14   Ht 6' (1.829 m)   Wt (!) 325 lb 4 oz (147.5 kg)   SpO2 96%   BMI 44.11 kg/m  General:   Well developed, NAD, see BMI.  HEENT:  Normocephalic . Face symmetric, atraumatic Lungs:  CTA B Normal respiratory effort, no intercostal retractions, no accessory  muscle use. Heart: RRR,  no murmur.  No pretibial edema bilaterally  Skin: Not pale. Not jaundice Neurologic:  alert & oriented X3.  Speech normal, gait appropriate for age and unassisted Psych--  Cognition and judgment appear intact.  Cooperative with normal attention span and concentration.  Behavior appropriate. No anxious or depressed appearing.      Assessment & Plan:    Assessment Hypertension OSA on CPAP GI: GERD, Fatty liver Asthma, previously RAD (occ wheezing w/ URIs, inhalers) DJD: lumbar spondylosis, back surgery 2003, knee replacements GU: --LUTS  Dr Venia Minks ([facility]) --h/o prostatitis admitted 2012 [facility] Glaucoma Osteopenia per DEXA 2008, multiple attempts to redo bone density test failed.  h/o iron deficiency  H/o  B12 and vit D def, 2016 levels wnl  PLAN:   Asthma:  Recently exacerbated, treated with a Z-Pak and 2 rounds of prednisone, currently doing better. For months he did well without taking Dulera but he is back on it.  Has albuterol as a rescue inhaler. The patient said "I did not know I have asthma".  explained to him that he does. Plan Dulera for 3 more weeks then stop, continue using inhalers as needed. RTC November as a schedule for a physical exam, pt likes to talk about Shingrix at the time.

## 2018-07-19 NOTE — Assessment & Plan Note (Signed)
Asthma:  Recently exacerbated, treated with a Z-Pak and 2 rounds of prednisone, currently doing better. For months he did well without taking Dulera but he is back on it.  Has albuterol as a rescue inhaler. The patient said "I did not know I have asthma".  explained to him that he does. Plan Dulera for 3 more weeks then stop, continue using inhalers as needed. RTC November as a schedule for a physical exam, pt likes to talk about Shingrix at the time.

## 2018-07-20 ENCOUNTER — Ambulatory Visit (HOSPITAL_COMMUNITY): Payer: Medicare Other | Attending: Cardiology

## 2018-07-20 ENCOUNTER — Other Ambulatory Visit: Payer: Self-pay

## 2018-07-20 DIAGNOSIS — G4733 Obstructive sleep apnea (adult) (pediatric): Secondary | ICD-10-CM | POA: Insufficient documentation

## 2018-07-20 DIAGNOSIS — Z6841 Body Mass Index (BMI) 40.0 and over, adult: Secondary | ICD-10-CM | POA: Diagnosis not present

## 2018-07-20 DIAGNOSIS — I119 Hypertensive heart disease without heart failure: Secondary | ICD-10-CM | POA: Insufficient documentation

## 2018-07-20 DIAGNOSIS — I517 Cardiomegaly: Secondary | ICD-10-CM | POA: Diagnosis not present

## 2018-07-20 DIAGNOSIS — D649 Anemia, unspecified: Secondary | ICD-10-CM | POA: Insufficient documentation

## 2018-07-21 ENCOUNTER — Encounter: Payer: Self-pay | Admitting: Family Medicine

## 2018-08-08 DIAGNOSIS — N3281 Overactive bladder: Secondary | ICD-10-CM | POA: Diagnosis not present

## 2018-08-16 ENCOUNTER — Other Ambulatory Visit: Payer: Self-pay | Admitting: Internal Medicine

## 2018-08-28 ENCOUNTER — Telehealth: Payer: Self-pay | Admitting: Radiology

## 2018-08-29 NOTE — Telephone Encounter (Signed)
Opened in error

## 2018-09-19 ENCOUNTER — Encounter: Payer: Self-pay | Admitting: Internal Medicine

## 2018-09-19 ENCOUNTER — Ambulatory Visit (INDEPENDENT_AMBULATORY_CARE_PROVIDER_SITE_OTHER): Payer: Medicare Other | Admitting: Internal Medicine

## 2018-09-19 VITALS — BP 134/76 | HR 67 | Temp 98.1°F | Resp 16 | Ht 72.0 in | Wt 324.5 lb

## 2018-09-19 DIAGNOSIS — Z23 Encounter for immunization: Secondary | ICD-10-CM

## 2018-09-19 DIAGNOSIS — Z0000 Encounter for general adult medical examination without abnormal findings: Secondary | ICD-10-CM | POA: Diagnosis not present

## 2018-09-19 LAB — LIPID PANEL
Cholesterol: 160 mg/dL (ref 0–200)
HDL: 45.9 mg/dL (ref >39.00)
LDL Cholesterol: 103 mg/dL — ABNORMAL HIGH (ref 0–99)
NonHDL: 114
Total CHOL/HDL Ratio: 3
Triglycerides: 54 mg/dL (ref 0.0–149.0)
VLDL: 10.8 mg/dL (ref 0.0–40.0)

## 2018-09-19 LAB — HEMOGLOBIN A1C: Hgb A1c MFr Bld: 5.9 % (ref 4.6–6.5)

## 2018-09-19 MED ORDER — ZOSTER VAC RECOMB ADJUVANTED 50 MCG/0.5ML IM SUSR: 0.5000 mL | Freq: Once | INTRAMUSCULAR | 1 refills | Status: AC

## 2018-09-19 NOTE — Progress Notes (Signed)
Subjective:    Patient ID: Cameron [name], male    DOB: 1946/11/02, 72 y.o.   MRN: [id number]  DOS:  09/19/2018 Type of visit - description : cpx Interval history:  No concerns, plays tennis, remains active   Review of Systems  A 14 point review of systems is negative    Past Medical History:  Diagnosis Date  . Arthritis    degenerative joint disease, knees   . Constipation due to pain medication   . Diverticulosis of colon   . Fatty liver   . GERD (gastroesophageal reflux disease)    takes Prevacid  . Glaucoma (increased eye pressure)   . HTN (hypertension)   . Iron deficiency anemia    h/o  . Keloid of skin   . Lumbar spondylosis    possible right sided radiculopathy with hx of surgery by Dr. Cyndy Freeze in 2003  . Osteopenia    DEXA 12-2006    . Personal history of colonic polyps 2002   TUBULAR ADENOMA  . Prostatitis, acute 04-2011   admitted to [facility]  . Sleep apnea    on CPAP, last study [facility]  . Tick bite     Past Surgical History:  Procedure Laterality Date  . BACK SURGERY  2003  . CHOLECYSTECTOMY  2003  . COLONOSCOPY    . LAPAROSCOPIC APPENDECTOMY N/A 01/16/2016   Procedure: APPENDECTOMY LAPAROSCOPIC;  Surgeon: Fanny Skates, MD;  Location: [place];  Service: General;  Laterality: N/A;  . LUMBAR LAMINECTOMY/DECOMPRESSION MICRODISCECTOMY N/A 08/22/2015   Procedure: Lumbar two to lumbar three lumbar three to lumbar four microdiskectomy;  Surgeon: Ashok Pall, MD;  Location: [place] [facility];  Service: Neurosurgery;  Laterality: N/A;  L23 L34 microdiskectomy  . TOTAL KNEE ARTHROPLASTY Right 06/11/2014   Procedure: TOTAL KNEE ARTHROPLASTY;  Surgeon: Hessie Dibble, MD;  Location: [place];  Service: Orthopedics;  Laterality: Right;  . TOTAL KNEE ARTHROPLASTY Left 10/08/2014   DR DALLDORF  . TOTAL KNEE ARTHROPLASTY Left 10/08/2014   Procedure: TOTAL LEFT KNEE ARTHROPLASTY;  Surgeon: Hessie Dibble, MD;  Location: [place];  Service: Orthopedics;  Laterality: Left;     Social History   Socioeconomic History  . Marital status: Married    Spouse name: Not on file  . Number of children: 2  . Years of education: Not on file  . Highest education level: Not on file  Occupational History  . Occupation: retired--works in [place]  . Financial resource strain: Not on file  . Food insecurity:    Worry: Not on file    Inability: Not on file  . Transportation needs:    Medical: Not on file    Non-medical: Not on file  Tobacco Use  . Smoking status: Never Smoker  . Smokeless tobacco: Never Used  Substance and Sexual Activity  . Alcohol use: Yes    Comment: most at 1/month  . Drug use: No  . Sexual activity: Not on file  Lifestyle  . Physical activity:    Days per week: Not on file    Minutes per session: Not on file  . Stress: Not on file  Relationships  . Social connections:    Talks on phone: Not on file    Gets together: Not on file    Attends religious service: Not on file    Active member of club or organization: Not on file    Attends meetings of clubs or organizations: Not on file  Relationship status: Not on file  . Intimate partner violence:    Fear of current or ex partner: Not on file    Emotionally abused: Not on file    Physically abused: Not on file    Forced sexual activity: Not on file  Other Topics Concern  . Not on file  Social History Narrative   Household- pt and wife   plays tennis sometimes      Family History  Problem Relation Age of Onset  . Dementia Mother   . Diverticulitis Father   . Colon cancer Maternal Uncle        dx age 67s  . Colon cancer Other        grandfather, dx age 22  . Stroke Other        maternal aunt in her 31s  . Breast cancer Maternal Aunt   . Prostate cancer Neg Hx   . Heart attack Neg Hx   . Hypertension Neg Hx   . Diabetes Neg Hx      Allergies as of 09/19/2018      Reactions   Chlorhexidine Gluconate Hives, Other (See Comments)   Red rash developed on upper  chest   Ciprofloxacin Hcl Itching   Clindamycin Itching, Rash      Medication List        Accurate as of 09/19/18 11:59 PM. Always use your most recent med list.          albuterol 108 (90 Base) MCG/ACT inhaler Commonly known as:  PROVENTIL HFA;VENTOLIN HFA Inhale 1-2 puffs into the lungs every 6 (six) hours as needed for wheezing or shortness of breath.   aspirin 81 MG tablet Take 81 mg by mouth daily.   brimonidine 0.2 % ophthalmic solution Commonly known as:  ALPHAGAN 1 drop daily.   folic acid 1 MG tablet Commonly known as:  FOLVITE Take 1 tablet (1 mg total) by mouth daily.   latanoprost 0.005 % ophthalmic solution Commonly known as:  XALATAN Place 1 drop into both eyes at bedtime.   metoprolol succinate 25 MG 24 hr tablet Commonly known as:  TOPROL-XL TAKE ONE-HALF TABLET BY  MOUTH DAILY   mometasone-formoterol 200-5 MCG/ACT Aero Commonly known as:  DULERA Inhale 2 puffs into the lungs 2 (two) times daily.   multivitamin with minerals Tabs tablet Take 1 tablet by mouth daily.   MYRBETRIQ 50 MG Tb24 tablet Generic drug:  mirabegron ER Take 50 mg by mouth daily.   solifenacin 5 MG tablet Commonly known as:  VESICARE Take 5 mg by mouth daily.   vitamin C 1000 MG tablet Take 1,000 mg by mouth 2 (two) times daily.   Zoster Vaccine Adjuvanted injection Commonly known as:  SHINGRIX Inject 0.5 mLs into the muscle once for 1 dose.          Objective:   Physical Exam BP 134/76 (BP Location: Left Arm, Patient Position: Sitting, Cuff Size: Normal)   Pulse 67   Temp 98.1 F (36.7 C) (Oral)   Resp 16   Ht 6' (1.829 m)   Wt (!) 324 lb 8 oz (147.2 kg)   SpO2 96%   BMI 44.01 kg/m  General: Well developed, NAD, BMI noted Neck: No  thyromegaly  HEENT:  Normocephalic . Face symmetric, atraumatic Lungs:  CTA B Normal respiratory effort, no intercostal retractions, no accessory muscle use. Heart: RRR,  no murmur.  No pretibial edema bilaterally   Abdomen:  Not distended, soft, non-tender. No rebound or rigidity.  Skin: Exposed areas without rash. Not pale. Not jaundice Neurologic:  alert & oriented X3.  Speech normal, gait appropriate for age and unassisted Strength symmetric and appropriate for age.  Psych: Cognition and judgment appear intact.  Cooperative with normal attention span and concentration.  Behavior appropriate. No anxious or depressed appearing.     Assessment & Plan:   Assessment Hypertension OSA on CPAP GI: GERD, Fatty liver Asthma, previously RAD (occ wheezing w/ URIs, inhalers) DJD: lumbar spondylosis, back surgery 2003, knee replacements Morbid obesity  GU: --LUTS  Dr Venia Minks ([facility]) --h/o prostatitis admitted 2012 [facility] Glaucoma Osteopenia per DEXA 2008, multiple attempts to redo bone density test failed.  h/o iron deficiency  H/o  B12 and vit D def, 2016 levels wnl  PLAN:   HTN: On metoprolol, seems controlled. OSA: Good compliance with CPAP Asthma: On Dulera, rarely use albuterol.  Okay to decrease Dulera to 1 puff B.I.D. and go back to 2 puff BID PRN LUTS: Takes Vesicare and Myrbetriq  Morbid obesity: counseled ref life style  RTC 1 year

## 2018-09-19 NOTE — Progress Notes (Signed)
Pre visit review using our clinic review tool, if applicable. No additional management support is needed unless otherwise documented below in the visit note. 

## 2018-09-19 NOTE — Patient Instructions (Signed)
GO TO THE LAB : Get the blood work     GO TO THE FRONT DESK Schedule your next appointment for a   physical in 1 year   Check the  blood pressure 2   times a month   Be sure your blood pressure is between 110/65 and  135/85. If it is consistently higher or lower, let me know

## 2018-09-19 NOTE — Assessment & Plan Note (Addendum)
-  Td 2017; zostavax 2014; pnm shot --2015; prevnar 09-2016. Flu shot today, printed shingrix   -[facility]: Colonoscopy: 05/08/2004, neg--no polyps  Colonoscopy: 12-2009-- tics Cscope again 05-03-11, polyp Cscope 11-2016, next per GI -Prostate ca screening:  PSA per urology.   -He is retired, remains active playing tennis weekly -Labs reviewed: will check a FLP A1C

## 2018-09-20 NOTE — Assessment & Plan Note (Addendum)
HTN: On metoprolol, seems controlled. OSA: Good compliance with CPAP Asthma: On Dulera, rarely use albuterol.  Okay to decrease Dulera to 1 puff B.I.D. and go back to 2 puff BID PRN LUTS: Takes Vesicare and Myrbetriq  Morbid obesity: counseled ref life style  RTC 1 year

## 2018-09-27 ENCOUNTER — Ambulatory Visit: Payer: Medicare Other | Admitting: Adult Health

## 2018-09-27 ENCOUNTER — Encounter: Payer: Self-pay | Admitting: Adult Health

## 2018-09-27 DIAGNOSIS — Z9989 Dependence on other enabling machines and devices: Secondary | ICD-10-CM

## 2018-09-27 DIAGNOSIS — G4733 Obstructive sleep apnea (adult) (pediatric): Secondary | ICD-10-CM | POA: Diagnosis not present

## 2018-09-27 NOTE — Progress Notes (Signed)
@Patient  ID: Cameron [name], male    DOB: [birth year], 72 y.o.   MRN: [id number]  Chief Complaint  Patient presents with  . Follow-up    OSA     Referring provider: Colon Branch, MD  HPI: 72 year old male followed for obstructive sleep apnea  TEST/EVENTS :  OSA was diagnosed in 1996 with PSG showing severe degree with AHI 87/hour.  09/27/2018 Follow up : OSA  Presents for a one-year follow-up for sleep apnea.  Patient has underlying severe sleep apnea is on CPAP.  Patient says he feels rested on CPAP.  He wears it every night.  Download shows excellent compliance with average usage at 7 hours.  Patient is on auto CPAP 5-15 summers H2O.  AHI 3.3. Has nasal pillows, no issues with machine or mask.   Very active , plays tennis weekly. Works at [employer] .   Allergies  Allergen Reactions  . Chlorhexidine Gluconate Hives and Other (See Comments)    Red rash developed on upper chest   . Ciprofloxacin Hcl Itching  . Clindamycin Itching and Rash    Immunization History  Administered Date(s) Administered  . Influenza Split 07/09/2014  . Influenza Whole 08/09/2008, 07/10/2011  . Influenza, High Dose Seasonal PF 09/19/2018  . Influenza,inj,Quad PF,6+ Mos 07/08/2016  . Influenza-Unspecified 08/30/2017  . Pneumococcal Conjugate-13 09/10/2016  . Pneumococcal Polysaccharide-23 07/23/2014  . Td 06/08/2006, 09/10/2016  . Zoster 04/06/2013    Past Medical History:  Diagnosis Date  . Arthritis    degenerative joint disease, knees   . Constipation due to pain medication   . Diverticulosis of colon   . Fatty liver   . GERD (gastroesophageal reflux disease)    takes Prevacid  . Glaucoma (increased eye pressure)   . HTN (hypertension)   . Iron deficiency anemia    h/o  . Keloid of skin   . Lumbar spondylosis    possible right sided radiculopathy with hx of surgery by Dr. Cyndy Freeze in 2003  . Osteopenia    DEXA 12-2006    . Personal history of colonic polyps 2002   TUBULAR  ADENOMA  . Prostatitis, acute 04-2011   admitted to [facility]  . Sleep apnea    on CPAP, last study [facility]  . Tick bite     Tobacco History: Social History   Tobacco Use  Smoking Status Never Smoker  Smokeless Tobacco Never Used   Counseling given: Not Answered   Outpatient Medications Prior to Visit  Medication Sig Dispense Refill  . albuterol (PROVENTIL HFA;VENTOLIN HFA) 108 (90 Base) MCG/ACT inhaler Inhale 1-2 puffs into the lungs every 6 (six) hours as needed for wheezing or shortness of breath. 18 g 5  . Ascorbic Acid (VITAMIN C) 1000 MG tablet Take 1,000 mg by mouth 2 (two) times daily.     Marland Kitchen aspirin 81 MG tablet Take 81 mg by mouth daily.    . brimonidine (ALPHAGAN) 0.2 % ophthalmic solution 3 drops daily.     . folic acid (FOLVITE) 1 MG tablet Take 1 tablet (1 mg total) by mouth daily. 90 tablet 3  . latanoprost (XALATAN) 0.005 % ophthalmic solution Place 1 drop into both eyes at bedtime.     . metoprolol succinate (TOPROL-XL) 25 MG 24 hr tablet TAKE ONE-HALF TABLET BY  MOUTH DAILY 45 tablet 3  . mometasone-formoterol (DULERA) 200-5 MCG/ACT AERO Inhale 2 puffs into the lungs 2 (two) times daily. 13 g 5  . Multiple Vitamin (MULTIVITAMIN WITH MINERALS) TABS  tablet Take 1 tablet by mouth daily.    Marland Kitchen MYRBETRIQ 50 MG TB24 tablet Take 50 mg by mouth daily.     . solifenacin (VESICARE) 5 MG tablet Take 5 mg by mouth daily.     No facility-administered medications prior to visit.      Review of Systems  Constitutional:   No  weight loss, night sweats,  Fevers, chills, fatigue, or  lassitude.  HEENT:   No headaches,  Difficulty swallowing,  Tooth/dental problems, or  Sore throat,                No sneezing, itching, ear ache, nasal congestion, post nasal drip,   CV:  No chest pain,  Orthopnea, PND, swelling in lower extremities, anasarca, dizziness, palpitations, syncope.   GI  No heartburn, indigestion, abdominal pain, nausea, vomiting, diarrhea, change in bowel habits, loss of  appetite, bloody stools.   Resp: No shortness of breath with exertion or at rest.  No excess mucus, no productive cough,  No non-productive cough,  No coughing up of blood.  No change in color of mucus.  No wheezing.  No chest wall deformity  Skin: no rash or lesions.  GU: no dysuria, change in color of urine, no urgency or frequency.  No flank pain, no hematuria   MS:  No joint pain or swelling.  No decreased range of motion.  No back pain.    Physical Exam  BP 124/82 (BP Location: Left Arm, Patient Position: Sitting, Cuff Size: Normal)   Pulse 77   Ht 6' (1.829 m)   Wt (!) 328 lb (148.8 kg)   SpO2 96%   BMI 44.48 kg/m   GEN: A/Ox3; pleasant , NAD, obese    HEENT:  [place]/AT,  EACs-clear, TMs-wnl, NOSE-clear, THROAT-clear, no lesions, no postnasal drip or exudate noted. Class 2-3 MP airway   NECK:  Supple w/ fair ROM; no JVD; normal carotid impulses w/o bruits; no thyromegaly or nodules palpated; no lymphadenopathy.    RESP  Clear  P & A; w/o, wheezes/ rales/ or rhonchi. no accessory muscle use, no dullness to percussion  CARD:  RRR, no m/r/g, no peripheral edema, pulses intact, no cyanosis or clubbing.  GI:   Soft & nt; nml bowel sounds; no organomegaly or masses detected.   Musco: Warm bil, no deformities or joint swelling noted.   Neuro: alert, no focal deficits noted.    Skin: Warm, no lesions or rashes    Lab Results:  CBC    Component Value Date/Time   WBC 4.7 07/03/2018 1439   RBC 4.61 07/03/2018 1439   HGB 13.8 07/03/2018 1439   HCT 41.1 07/03/2018 1439   PLT 185.0 07/03/2018 1439   MCV 89.2 07/03/2018 1439   MCH 30.0 12/05/2017 0728   MCHC 33.5 07/03/2018 1439   RDW 13.8 07/03/2018 1439   LYMPHSABS 1.3 07/03/2018 1439   MONOABS 0.8 07/03/2018 1439   EOSABS 0.2 07/03/2018 1439   BASOSABS 0.0 07/03/2018 1439    BMET    Component Value Date/Time   NA 141 07/03/2018 1439   K 4.2 07/03/2018 1439   CL 102 07/03/2018 1439   CO2 35 (H) 07/03/2018 1439    GLUCOSE 76 07/03/2018 1439   BUN 10 07/03/2018 1439   CREATININE 0.94 07/03/2018 1439   CREATININE 0.90 09/10/2016 1554   CALCIUM 9.6 07/03/2018 1439   GFRNONAA >60 12/05/2017 0728   GFRAA >60 12/05/2017 0728    BNP No results found for: BNP  ProBNP    Component Value Date/Time   PROBNP 49.0 07/03/2018 1439    Imaging: No results found.    No flowsheet data found.  No results found for: NITRICOXIDE      Assessment & Plan:   OSA on CPAP Excellent control and compliance on CPAP  Plan  Patient Instructions  Keep up the good work Wear CPAP each night. Do not drive if sleepy Work on healthy weight Follow-up in 1 year with Dr. Elsworth Soho and as needed     Morbid obesity Wt loss      Rexene Edison, NP 09/27/2018

## 2018-09-27 NOTE — Assessment & Plan Note (Signed)
Excellent control and compliance on CPAP  Plan  Patient Instructions  Keep up the good work Wear CPAP each night. Do not drive if sleepy Work on healthy weight Follow-up in 1 year with Dr. Elsworth Soho and as needed

## 2018-09-27 NOTE — Assessment & Plan Note (Signed)
Wt loss  

## 2018-09-27 NOTE — Patient Instructions (Signed)
Keep up the good work Wear CPAP each night. Do not drive if sleepy Work on healthy weight Follow-up in 1 year with Dr. Elsworth Soho and as needed

## 2018-09-29 DIAGNOSIS — G4733 Obstructive sleep apnea (adult) (pediatric): Secondary | ICD-10-CM | POA: Diagnosis not present

## 2018-10-18 DIAGNOSIS — H401131 Primary open-angle glaucoma, bilateral, mild stage: Secondary | ICD-10-CM | POA: Diagnosis not present

## 2018-10-19 DIAGNOSIS — H04123 Dry eye syndrome of bilateral lacrimal glands: Secondary | ICD-10-CM | POA: Diagnosis not present

## 2018-10-19 DIAGNOSIS — H2513 Age-related nuclear cataract, bilateral: Secondary | ICD-10-CM | POA: Diagnosis not present

## 2018-10-19 DIAGNOSIS — H401131 Primary open-angle glaucoma, bilateral, mild stage: Secondary | ICD-10-CM | POA: Diagnosis not present

## 2018-10-26 ENCOUNTER — Other Ambulatory Visit: Payer: Self-pay | Admitting: Internal Medicine

## 2018-12-11 DIAGNOSIS — H401131 Primary open-angle glaucoma, bilateral, mild stage: Secondary | ICD-10-CM | POA: Diagnosis not present

## 2018-12-11 DIAGNOSIS — H04123 Dry eye syndrome of bilateral lacrimal glands: Secondary | ICD-10-CM | POA: Diagnosis not present

## 2018-12-11 DIAGNOSIS — H2513 Age-related nuclear cataract, bilateral: Secondary | ICD-10-CM | POA: Diagnosis not present

## 2019-04-03 DIAGNOSIS — G4733 Obstructive sleep apnea (adult) (pediatric): Secondary | ICD-10-CM | POA: Diagnosis not present

## 2019-04-19 DIAGNOSIS — Z7189 Other specified counseling: Secondary | ICD-10-CM | POA: Diagnosis not present

## 2019-04-30 DIAGNOSIS — H04123 Dry eye syndrome of bilateral lacrimal glands: Secondary | ICD-10-CM | POA: Diagnosis not present

## 2019-04-30 DIAGNOSIS — H2513 Age-related nuclear cataract, bilateral: Secondary | ICD-10-CM | POA: Diagnosis not present

## 2019-04-30 DIAGNOSIS — H02831 Dermatochalasis of right upper eyelid: Secondary | ICD-10-CM | POA: Diagnosis not present

## 2019-04-30 DIAGNOSIS — H401131 Primary open-angle glaucoma, bilateral, mild stage: Secondary | ICD-10-CM | POA: Diagnosis not present

## 2019-06-25 DIAGNOSIS — M25511 Pain in right shoulder: Secondary | ICD-10-CM | POA: Diagnosis not present

## 2019-06-25 DIAGNOSIS — Z96653 Presence of artificial knee joint, bilateral: Secondary | ICD-10-CM | POA: Diagnosis not present

## 2019-07-04 DIAGNOSIS — G4733 Obstructive sleep apnea (adult) (pediatric): Secondary | ICD-10-CM | POA: Diagnosis not present

## 2019-07-16 IMAGING — CR DG CHEST 2V
2 series · 2 of 2 positions shown · non-contrast
Comparison: 06/04/2014

CLINICAL DATA: Wheezing 3-4 days with cough.

EXAM:
CHEST  2 VIEW

[w chest pa *]
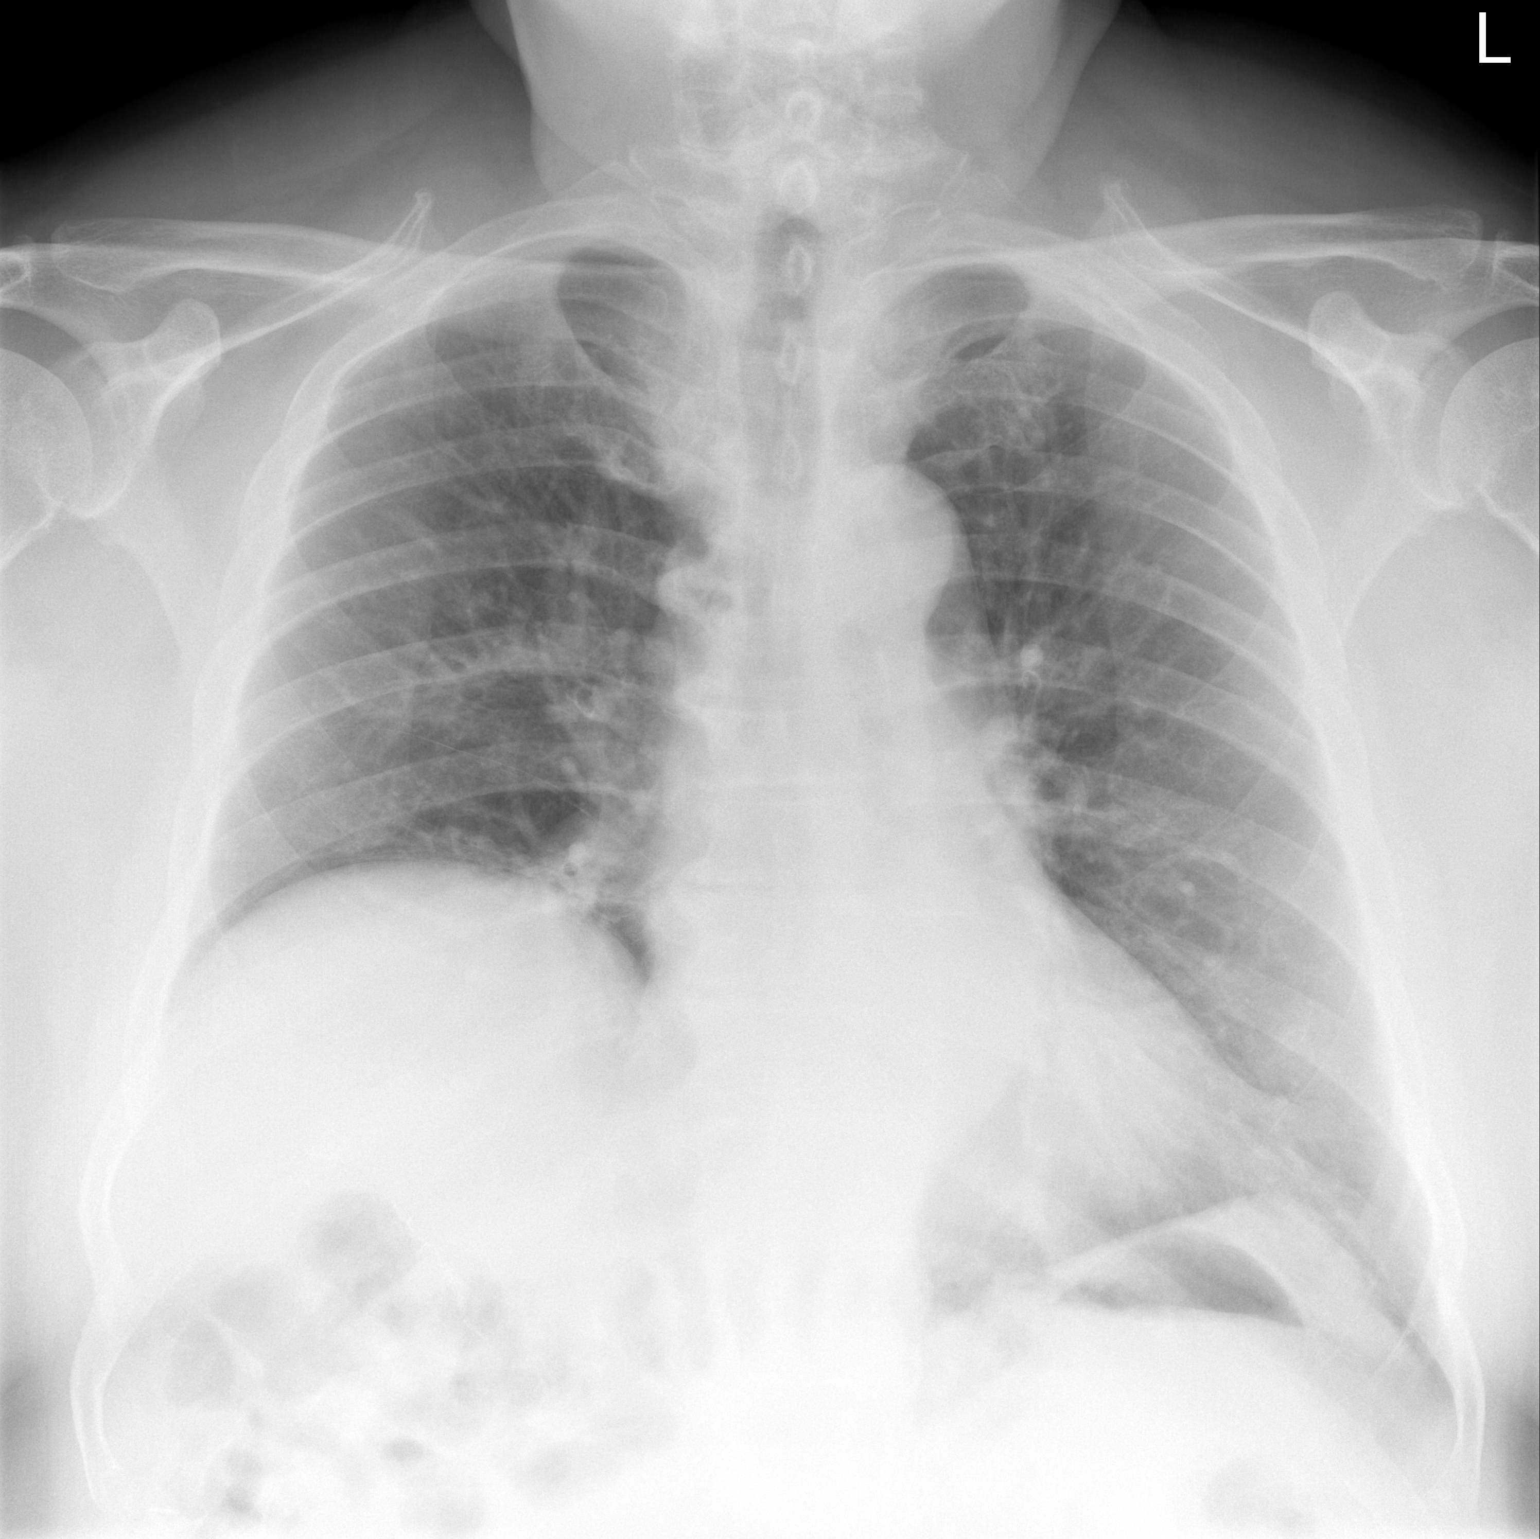

[w chest lat *]
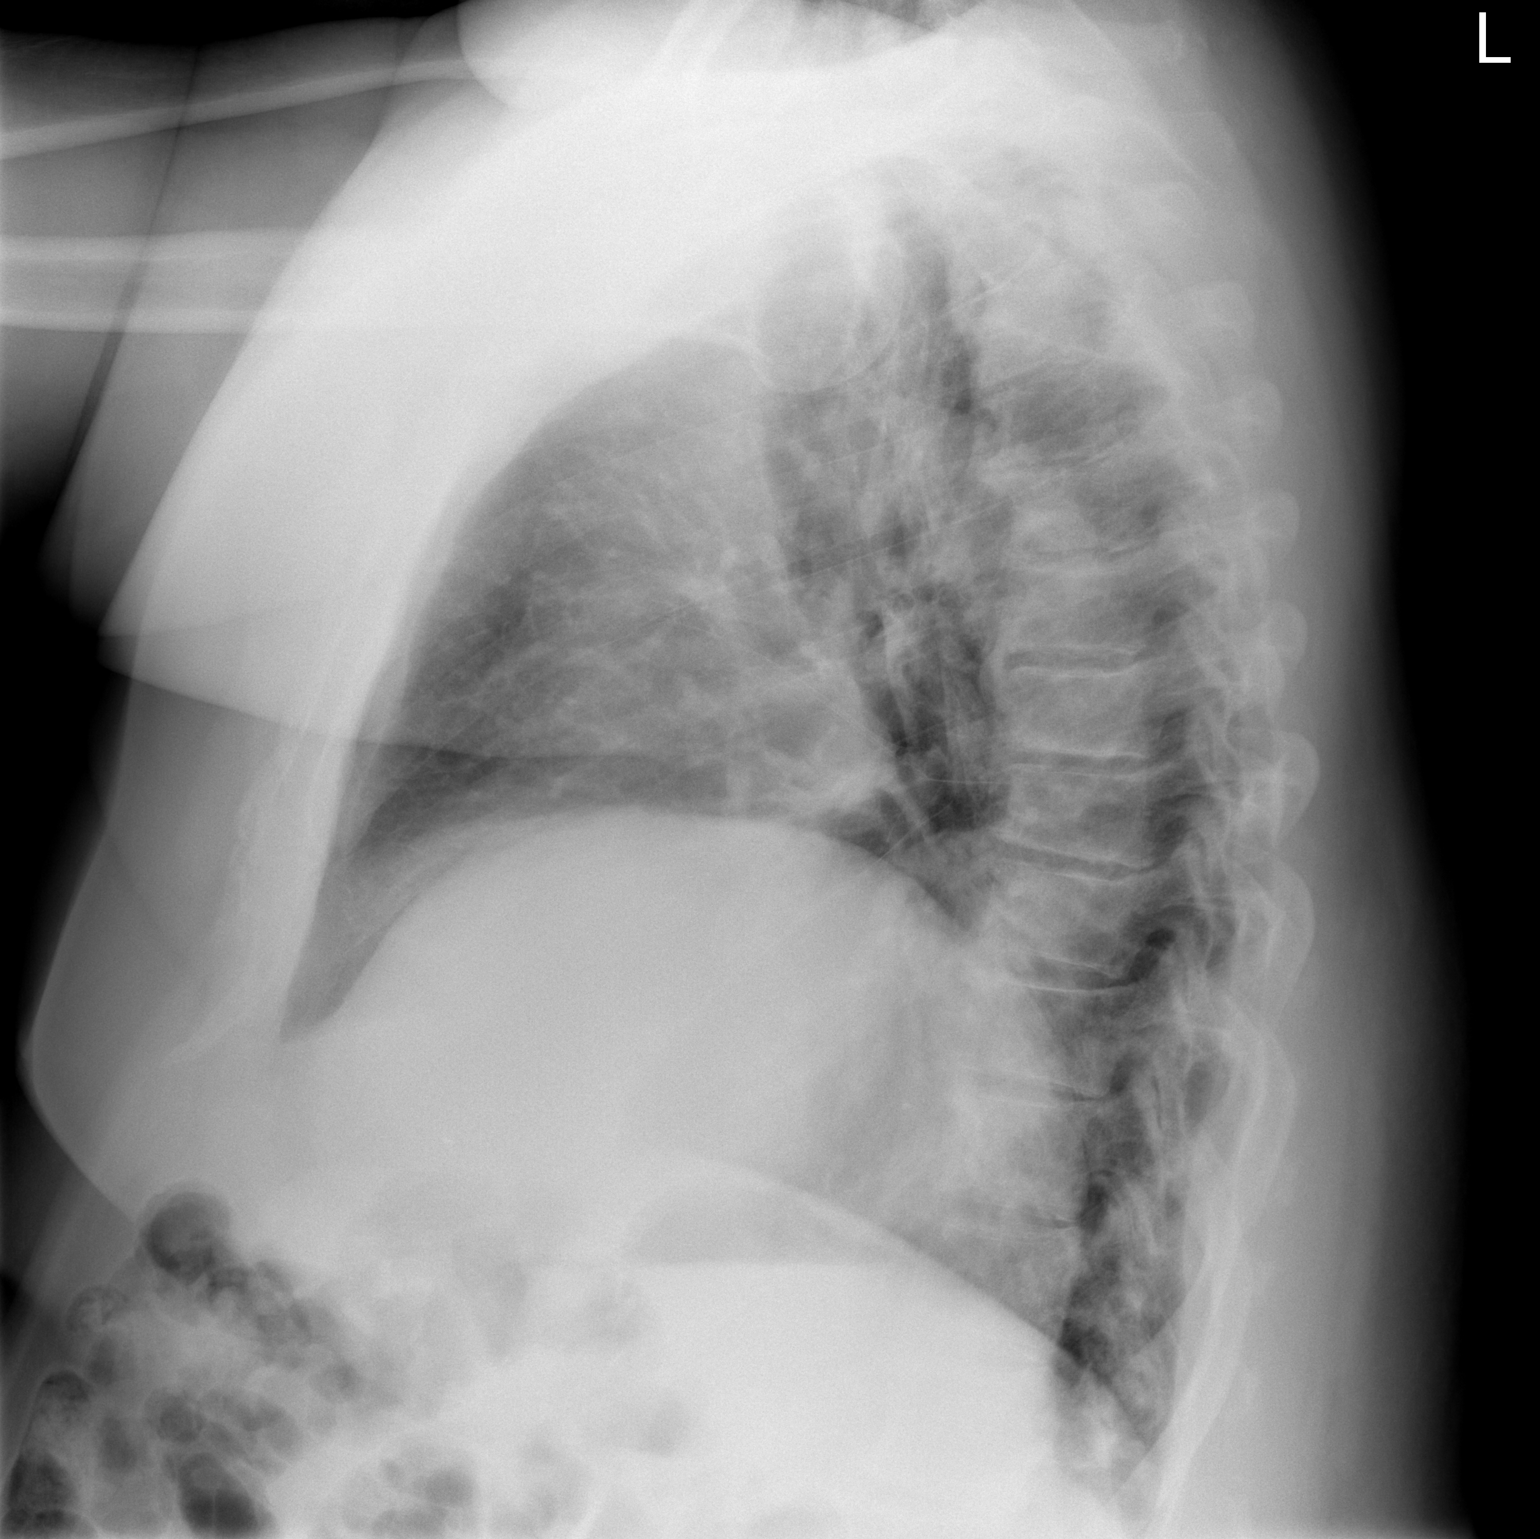

[2 of 2 positions shown; findings below may reference images not displayed]

FINDINGS: Lungs are adequately inflated with mild posterior left retrocardiac
opacification which may be due to vascular crowding, although
atelectasis or early infection is possible. Stable elevation the
right hemidiaphragm. Cardiomediastinal silhouette and remainder of
the exam is unchanged.
IMPRESSION: Mild opacification in the posterior left retrocardiac region which
may be due to vascular crowding, although atelectasis or early
infection is possible.

## 2019-07-23 DIAGNOSIS — M25511 Pain in right shoulder: Secondary | ICD-10-CM | POA: Diagnosis not present

## 2019-08-16 DIAGNOSIS — N138 Other obstructive and reflux uropathy: Secondary | ICD-10-CM | POA: Diagnosis not present

## 2019-08-16 DIAGNOSIS — N3281 Overactive bladder: Secondary | ICD-10-CM | POA: Diagnosis not present

## 2019-08-27 DIAGNOSIS — R05 Cough: Secondary | ICD-10-CM | POA: Diagnosis not present

## 2019-08-27 DIAGNOSIS — R5383 Other fatigue: Secondary | ICD-10-CM | POA: Diagnosis not present

## 2019-08-27 DIAGNOSIS — R0602 Shortness of breath: Secondary | ICD-10-CM | POA: Diagnosis not present

## 2019-08-27 DIAGNOSIS — D72819 Decreased white blood cell count, unspecified: Secondary | ICD-10-CM | POA: Diagnosis not present

## 2019-09-03 ENCOUNTER — Ambulatory Visit (INDEPENDENT_AMBULATORY_CARE_PROVIDER_SITE_OTHER): Payer: Medicare Other | Admitting: Family Medicine

## 2019-09-03 ENCOUNTER — Ambulatory Visit (HOSPITAL_BASED_OUTPATIENT_CLINIC_OR_DEPARTMENT_OTHER)
Admission: RE | Admit: 2019-09-03 | Discharge: 2019-09-03 | Disposition: A | Discharge status: Home / Self Care | Payer: Medicare Other | Source: Ambulatory Visit | Attending: Family Medicine | Admitting: Family Medicine

## 2019-09-03 ENCOUNTER — Other Ambulatory Visit: Payer: Self-pay

## 2019-09-03 VITALS — BP 114/87 | Temp 97.0°F

## 2019-09-03 DIAGNOSIS — R059 Cough, unspecified: Secondary | ICD-10-CM

## 2019-09-03 DIAGNOSIS — J40 Bronchitis, not specified as acute or chronic: Secondary | ICD-10-CM

## 2019-09-03 DIAGNOSIS — R05 Cough: Secondary | ICD-10-CM

## 2019-09-03 MED ORDER — DOXYCYCLINE HYCLATE 100 MG PO TABS: 100.0000 mg | ORAL_TABLET | Freq: Two times a day (BID) | ORAL | 0 refills | Status: DC

## 2019-09-03 MED ORDER — PREDNISONE 10 MG PO TABS: ORAL_TABLET | ORAL | 0 refills | Status: DC

## 2019-09-03 NOTE — Progress Notes (Signed)
Virtual Visit via Video Note  I connected with Cameron [name] on 09/03/19 at  1:20 PM EDT by a video enabled telemedicine application and verified that I am speaking with the correct person using two identifiers.  Location: Patient: home with wife Provider: office    I discussed the limitations of evaluation and management by telemedicine and the availability of in person appointments. The patient expressed understanding and agreed to proceed.  History of Present Illness: Pt is home  2 week hx of congestion / cough    Monday he went to UC in [place]-- and he was dx bronchitis and covid neg  He was given a z pak and albuterol  Pt still c/o prod cough and rattling in the chest   Observations/Objective:  Vitals:   09/03/19 1333  BP: 114/87  Temp: (!) 97 F (36.1 C)   pt is in [facility] had to transition to tele due to equipment malfunction   Assessment and Plan: 1. Cough  - DG Chest 2 View; Future  2. Bronchitis pred taper  abx con't inhaler  Call back prn  - predniSONE (DELTASONE) 10 MG tablet; TAKE 3 TABLETS PO QD FOR 3 DAYS THEN TAKE 2 TABLETS PO QD FOR 3 DAYS THEN TAKE 1 TABLET PO QD FOR 3 DAYS THEN TAKE 1/2 TAB PO QD FOR 3 DAYS  Dispense: 20 tablet; Refill: 0 - doxycycline (VIBRA-TABS) 100 MG tablet; Take 1 tablet (100 mg total) by mouth 2 (two) times daily.  Dispense: 20 tablet; Refill: 0   Follow Up Instructions:    I discussed the assessment and treatment plan with the patient. The patient was provided an opportunity to ask questions and all were answered. The patient agreed with the plan and demonstrated an understanding of the instructions.   The patient was advised to call back or seek an in-person evaluation if the symptoms worsen or if the condition fails to improve as anticipated.  I provided 15 minutes of non-face-to-face time during this encounter.   Ann Held, DO

## 2019-09-04 ENCOUNTER — Encounter: Payer: Self-pay | Admitting: Family Medicine

## 2019-09-05 ENCOUNTER — Other Ambulatory Visit: Payer: Self-pay | Admitting: Internal Medicine

## 2019-09-20 ENCOUNTER — Other Ambulatory Visit: Payer: Self-pay | Admitting: Internal Medicine

## 2019-09-25 ENCOUNTER — Ambulatory Visit (INDEPENDENT_AMBULATORY_CARE_PROVIDER_SITE_OTHER): Payer: Medicare Other | Admitting: Internal Medicine

## 2019-09-25 ENCOUNTER — Encounter: Payer: Medicare Other | Admitting: Internal Medicine

## 2019-09-25 ENCOUNTER — Other Ambulatory Visit: Payer: Self-pay

## 2019-09-25 ENCOUNTER — Encounter: Payer: Self-pay | Admitting: Internal Medicine

## 2019-09-25 VITALS — BP 140/86 | HR 70 | Temp 96.6°F | Resp 18 | Ht 72.0 in | Wt 315.2 lb

## 2019-09-25 DIAGNOSIS — R739 Hyperglycemia, unspecified: Secondary | ICD-10-CM | POA: Diagnosis not present

## 2019-09-25 DIAGNOSIS — Z0000 Encounter for general adult medical examination without abnormal findings: Secondary | ICD-10-CM | POA: Diagnosis not present

## 2019-09-25 DIAGNOSIS — R202 Paresthesia of skin: Secondary | ICD-10-CM

## 2019-09-25 LAB — CBC WITH DIFFERENTIAL/PLATELET
Basophils Absolute: 0 10*3/uL (ref 0.0–0.1)
Basophils Relative: 0.3 % (ref 0.0–3.0)
Eosinophils Absolute: 0.2 10*3/uL (ref 0.0–0.7)
Eosinophils Relative: 5.4 % — ABNORMAL HIGH (ref 0.0–5.0)
HCT: 40.9 % (ref 39.0–52.0)
Hemoglobin: 13.7 g/dL (ref 13.0–17.0)
Lymphocytes Relative: 33.6 % (ref 12.0–46.0)
Lymphs Abs: 1.3 10*3/uL (ref 0.7–4.0)
MCHC: 33.5 g/dL (ref 30.0–36.0)
MCV: 90.2 fl (ref 78.0–100.0)
Monocytes Absolute: 0.6 10*3/uL (ref 0.1–1.0)
Monocytes Relative: 14.7 % — ABNORMAL HIGH (ref 3.0–12.0)
Neutro Abs: 1.8 10*3/uL (ref 1.4–7.7)
Neutrophils Relative %: 46 % (ref 43.0–77.0)
Platelets: 191 10*3/uL (ref 150.0–400.0)
RBC: 4.53 Mil/uL (ref 4.22–5.81)
RDW: 14.2 % (ref 11.5–15.5)
WBC: 3.9 10*3/uL — ABNORMAL LOW (ref 4.0–10.5)

## 2019-09-25 LAB — COMPREHENSIVE METABOLIC PANEL
ALT: 24 U/L (ref 0–53)
AST: 22 U/L (ref 0–37)
Albumin: 4.2 g/dL (ref 3.5–5.2)
Alkaline Phosphatase: 82 U/L (ref 39–117)
BUN: 14 mg/dL (ref 6–23)
CO2: 32 mEq/L (ref 19–32)
Calcium: 9.2 mg/dL (ref 8.4–10.5)
Chloride: 104 mEq/L (ref 96–112)
Creatinine, Ser: 0.86 mg/dL (ref 0.40–1.50)
GFR: 106.08 mL/min (ref >60.00)
Glucose, Bld: 82 mg/dL (ref 70–99)
Potassium: 3.9 mEq/L (ref 3.5–5.1)
Sodium: 142 mEq/L (ref 135–145)
Total Bilirubin: 1.1 mg/dL (ref 0.2–1.2)
Total Protein: 6.6 g/dL (ref 6.0–8.3)

## 2019-09-25 LAB — HEMOGLOBIN A1C: Hgb A1c MFr Bld: 5.9 % (ref 4.6–6.5)

## 2019-09-25 LAB — B12 AND FOLATE PANEL
Folate: >24.1 ng/mL (ref >5.9)
Vitamin B-12: 204 pg/mL — ABNORMAL LOW (ref 211–911)

## 2019-09-25 LAB — LIPID PANEL
Cholesterol: 174 mg/dL (ref 0–200)
HDL: 47.2 mg/dL (ref >39.00)
LDL Cholesterol: 118 mg/dL — ABNORMAL HIGH (ref 0–99)
NonHDL: 126.51
Total CHOL/HDL Ratio: 4
Triglycerides: 43 mg/dL (ref 0.0–149.0)
VLDL: 8.6 mg/dL (ref 0.0–40.0)

## 2019-09-25 LAB — VITAMIN D 25 HYDROXY (VIT D DEFICIENCY, FRACTURES): VITD: 16.12 ng/mL — ABNORMAL LOW (ref 30.00–100.00)

## 2019-09-25 MED ORDER — SHINGRIX 50 MCG/0.5ML IM SUSR: 0.5000 mL | Freq: Once | INTRAMUSCULAR | 1 refills | Status: AC

## 2019-09-25 NOTE — Patient Instructions (Addendum)
Please schedule Medicare Wellness with Cameron [name].   GO TO THE LAB : Get the blood work     GO TO THE FRONT DESK Schedule your next appointment   for a physical exam in 1 year    Check the  blood pressure 2 or 3 times a month   BP GOAL is between 110/65 and  135/85. If it is consistently higher or lower, let me know

## 2019-09-25 NOTE — Assessment & Plan Note (Signed)
-  Td 2017 - zostavax 2014 -Shingrix printed again today -pnm shot --2015 - prevnar 09-2016  - had a flu shot  -CCS: Colonoscopy: 05/08/2004, neg--no polyps  Colonoscopy: 12-2009-- tics Cscope again 05-03-11, polyp Cscope 11-2016, next per GI -Prostate ca screening:  PSA per urology.   - remains active playing tennis  -Labs reviewed: CMP, FLP, CBC, 123456, 123456, folic acid, vitamin D

## 2019-09-25 NOTE — Progress Notes (Signed)
Subjective:    Patient ID: Cameron [name], male    DOB: [birth year], 73 y.o.   MRN: [id number]  DOS:  09/25/2019 Type of visit - description: CPX In general feeling well  Did report paresthesias: Few weeks ago had temporarily some numbness on the left lower lip.  That is resolved. Also for few days experienced a burning feeling at the external side of the left foot. For the last couple of weeks he has numbness at the left fifth finger.  Denies eye pain or light intolerance, no headache, dizziness, nausea or vomiting No diplopia No slurred speech or motor deficits  Review of Systems  Other than above, a 14 point review of systems is negative     Past Medical History:  Diagnosis Date  . Arthritis    degenerative joint disease, knees   . Constipation due to pain medication   . Diverticulosis of colon   . Fatty liver   . GERD (gastroesophageal reflux disease)    takes Prevacid  . Glaucoma (increased eye pressure)   . HTN (hypertension)   . Iron deficiency anemia    h/o  . Keloid of skin   . Lumbar spondylosis    possible right sided radiculopathy with hx of surgery by Dr. Cyndy Freeze in 2003  . Osteopenia    DEXA 12-2006    . Personal history of colonic polyps 2002   TUBULAR ADENOMA  . Prostatitis, acute 04-2011   admitted to [facility]  . Sleep apnea    on CPAP, last study [facility]  . Tick bite     Past Surgical History:  Procedure Laterality Date  . BACK SURGERY  2003  . CHOLECYSTECTOMY  2003  . COLONOSCOPY    . LAPAROSCOPIC APPENDECTOMY N/A 01/16/2016   Procedure: APPENDECTOMY LAPAROSCOPIC;  Surgeon: Fanny Skates, MD;  Location: [facility];  Service: General;  Laterality: N/A;  . LUMBAR LAMINECTOMY/DECOMPRESSION MICRODISCECTOMY N/A 08/22/2015   Procedure: Lumbar two to lumbar three lumbar three to lumbar four microdiskectomy;  Surgeon: Ashok Pall, MD;  Location: [facility];  Service: Neurosurgery;  Laterality: N/A;  L23 L34 microdiskectomy  . TOTAL KNEE ARTHROPLASTY  Right 06/11/2014   Procedure: TOTAL KNEE ARTHROPLASTY;  Surgeon: Hessie Dibble, MD;  Location: [facility];  Service: Orthopedics;  Laterality: Right;  . TOTAL KNEE ARTHROPLASTY Left 10/08/2014   DR DALLDORF  . TOTAL KNEE ARTHROPLASTY Left 10/08/2014   Procedure: TOTAL LEFT KNEE ARTHROPLASTY;  Surgeon: Hessie Dibble, MD;  Location: [facility];  Service: Orthopedics;  Laterality: Left;    Social History   Socioeconomic History  . Marital status: Married    Spouse name: Not on file  . Number of children: 2  . Years of education: Not on file  . Highest education level: Not on file  Occupational History  . Occupation: retired--works in [place]  . Financial resource strain: Not on file  . Food insecurity    Worry: Not on file    Inability: Not on file  . Transportation needs    Medical: Not on file    Non-medical: Not on file  Tobacco Use  . Smoking status: Never Smoker  . Smokeless tobacco: Never Used  Substance and Sexual Activity  . Alcohol use: Yes    Comment: most at 1/month  . Drug use: No  . Sexual activity: Not on file  Lifestyle  . Physical activity    Days per week: Not on file    Minutes per session:  Not on file  . Stress: Not on file  Relationships  . Social Herbalist on phone: Not on file    Gets together: Not on file    Attends religious service: Not on file    Active member of club or organization: Not on file    Attends meetings of clubs or organizations: Not on file    Relationship status: Not on file  . Intimate partner violence    Fear of current or ex partner: Not on file    Emotionally abused: Not on file    Physically abused: Not on file    Forced sexual activity: Not on file  Other Topics Concern  . Not on file  Social History Narrative   Household- pt and wife   plays tennis sometimes      Family History  Problem Relation Age of Onset  . Dementia Mother   . Diverticulitis Father   . Colon cancer Maternal Uncle        dx  age 14s  . Colon cancer Other        grandfather, dx age 79  . Stroke Other        maternal aunt in her 5s  . Breast cancer Maternal Aunt   . Prostate cancer Neg Hx   . Heart attack Neg Hx   . Hypertension Neg Hx   . Diabetes Neg Hx      Allergies as of 09/25/2019      Reactions   Chlorhexidine Gluconate Hives, Other (See Comments)   Red rash developed on upper chest   Ciprofloxacin Hcl Itching   Clindamycin Itching, Rash      Medication List       Accurate as of September 25, 2019 11:59 PM. If you have any questions, ask your nurse or doctor.        STOP taking these medications   doxycycline 100 MG tablet Commonly known as: VIBRA-TABS Stopped by: Kathlene November, MD   predniSONE 10 MG tablet Commonly known as: DELTASONE Stopped by: Kathlene November, MD     TAKE these medications   albuterol 108 (90 Base) MCG/ACT inhaler Commonly known as: VENTOLIN HFA Inhale 1-2 puffs into the lungs every 6 (six) hours as needed for wheezing or shortness of breath.   aspirin 81 MG tablet Take 81 mg by mouth daily.   brimonidine 0.2 % ophthalmic solution Commonly known as: ALPHAGAN 3 drops daily.   folic acid 1 MG tablet Commonly known as: FOLVITE Take 1 tablet (1 mg total) by mouth daily.   latanoprost 0.005 % ophthalmic solution Commonly known as: XALATAN Place 1 drop into both eyes at bedtime.   metoprolol succinate 25 MG 24 hr tablet Commonly known as: TOPROL-XL Take 0.5 tablets (12.5 mg total) by mouth daily.   mometasone-formoterol 200-5 MCG/ACT Aero Commonly known as: Dulera Inhale 2 puffs into the lungs 2 (two) times daily.   multivitamin with minerals Tabs tablet Take 1 tablet by mouth daily.   Myrbetriq 50 MG Tb24 tablet Generic drug: mirabegron ER Take 50 mg by mouth daily.   Shingrix injection Generic drug: Zoster Vaccine Adjuvanted Inject 0.5 mLs into the muscle once for 1 dose. Started by: Kathlene November, MD   solifenacin 5 MG tablet Commonly known as: VESICARE  Take 5 mg by mouth daily.   vitamin C 1000 MG tablet Take 1,000 mg by mouth 2 (two) times daily.           Objective:  Physical Exam BP 140/86 (BP Location: Left Arm, Patient Position: Sitting, Cuff Size: Normal)   Pulse 70   Temp (!) 96.6 F (35.9 C) (Temporal)   Resp 18   Ht 6' (1.829 m)   Wt (!) 315 lb 4 oz (143 kg)   SpO2 95%   BMI 42.76 kg/m  General: Well developed, NAD, BMI noted Neck: No  thyromegaly  HEENT:  Normocephalic . Face symmetric, atraumatic Lungs:  CTA B Normal respiratory effort, no intercostal retractions, no accessory muscle use. Heart: RRR,  no murmur.  No pretibial edema bilaterally  Abdomen:  Not distended, soft, non-tender. No rebound or rigidity.   Skin: Exposed areas without rash. Not pale. Not jaundice Neurologic:  alert & oriented X3.  Speech normal, gait appropriate for age and unassisted Strength symmetric and appropriate for age. DTRs and motor symmetric Psych: Cognition and judgment appear intact.  Cooperative with normal attention span and concentration.  Behavior appropriate. No anxious or depressed appearing.     Assessment    Assessment Hypertension OSA on CPAP GI: GERD, Fatty liver Asthma, previously RAD (occ wheezing w/ URIs, inhalers) DJD: lumbar spondylosis, back surgery 2003, knee replacements Morbid obesity  GU: --LUTS  Dr Venia Minks ([facility]) --h/o prostatitis admitted 2012 [facility] Glaucoma Osteopenia per DEXA 2008, multiple attempts to redo bone density test failed.  h/o iron deficiency  H/o  B12 and vit D def, 2016 levels wnl  PLAN:   Here for CPX HTN: On metoprolol, ambulatory BPs in the 130s.  No change OSA: Good CPAP compliance Asthma: Uses inhalers as needed Morbid obesity: Diet discussed, I do not think he is ready to do any major changes, he is very active plays tennis. Paresthesias: Multiple paresthesias as described above, all of them transit, will check a 123456, folic acid and vitamin D.  Etiology is not  clear, we talk about possibly neurology referral for further eval, we agreed on hold off for now but he will let me know if symptoms persist or increase. RTC 1 year

## 2019-09-25 NOTE — Progress Notes (Signed)
Pre visit review using our clinic review tool, if applicable. No additional management support is needed unless otherwise documented below in the visit note. 

## 2019-09-26 DIAGNOSIS — Z0000 Encounter for general adult medical examination without abnormal findings: Secondary | ICD-10-CM | POA: Insufficient documentation

## 2019-09-26 HISTORY — DX: Encounter for general adult medical examination without abnormal findings: Z00.00

## 2019-09-26 NOTE — Patient Instructions (Addendum)
You were seen today by Lauraine Rinne, NP  for:   1. OSA on CPAP  We recommend that you continue using your CPAP daily >>>Keep up the hard work using your device >>> Goal should be wearing this for the entire night that you are sleeping, at least 4 to 6 hours  Remember:  . Do not drive or operate heavy machinery if tired or drowsy.  . Please notify the supply company and office if you are unable to use your device regularly due to missing supplies or machine being broken.  . Work on maintaining a healthy weight and following your recommended nutrition plan  . Maintain proper daily exercise and movement  . Maintaining proper use of your device can also help improve management of other chronic illnesses such as: Blood pressure, blood sugars, and weight management.   BiPAP/ CPAP Cleaning:  >>>Clean weekly, with Dawn soap, and bottle brush.  Set up to air dry.  2. Morbid obesity ([place])  Continue to work on increasing daily physical activity to reduce body weight Attempt to follow a lower carbohydrate diet Work on reducing overall caloric intake  3. Healthcare maintenance  Great job already receiving your flu vaccine    Follow Up:    Return in about 1 year (around 09/26/2020), or if symptoms worsen or fail to improve, for Follow up with Dr. Elsworth Soho, Follow up with Wyn Quaker FNP-C.   Please do your part to reduce the spread of COVID-19:      Reduce your risk of any infection  and COVID19 by using the similar precautions used for avoiding the common cold or flu:  Marland Kitchen Wash your hands often with soap and warm water for at least 20 seconds.  If soap and water are not readily available, use an alcohol-based hand sanitizer with at least 60% alcohol.  . If coughing or sneezing, cover your mouth and nose by coughing or sneezing into the elbow areas of your shirt or coat, into a tissue or into your sleeve (not your hands). Langley Gauss A MASK when in public  . Avoid shaking hands with others and  consider head nods or verbal greetings only. . Avoid touching your eyes, nose, or mouth with unwashed hands.  . Avoid close contact with people who are sick. . Avoid places or events with large numbers of people in one location, like concerts or sporting events. . If you have some symptoms but not all symptoms, continue to monitor at home and seek medical attention if your symptoms worsen. . If you are having a medical emergency, call 911.   [place] / e-Visit: [email or web address]         [facility]: [facility]: [id number]                   [facility]: [id number]     It is flu season:   >>> Best ways to protect herself from the flu: Receive the yearly flu vaccine, practice good hand hygiene washing with soap and also using hand sanitizer when available, eat a nutritious meals, get adequate rest, hydrate appropriately   Please contact the office if your symptoms worsen or you have concerns that you are not improving.   Thank you for choosing [facility] for your healthcare, and for allowing [place] to partner with you on your healthcare journey. I am thankful to be able to provide care  to you today.   Wyn Quaker FNP-C    Sleep Apnea Sleep apnea affects breathing during sleep. It causes breathing to stop for a short time or to become shallow. It can also increase the risk of:  Heart attack.  Stroke.  Being very overweight (obese).  Diabetes.  Heart failure.  Irregular heartbeat. The goal of treatment is to help you breathe normally again. What are the causes? There are three kinds of sleep apnea:  Obstructive sleep apnea. This is caused by a blocked or collapsed airway.  Central sleep apnea. This happens when the brain does not send the right signals to the muscles that control breathing.  Mixed sleep  apnea. This is a combination of obstructive and central sleep apnea. The most common cause of this condition is a collapsed or blocked airway. This can happen if:  Your throat muscles are too relaxed.  Your tongue and tonsils are too large.  You are overweight.  Your airway is too small. What increases the risk?  Being overweight.  Smoking.  Having a small airway.  Being older.  Being male.  Drinking alcohol.  Taking medicines to calm yourself (sedatives or tranquilizers).  Having family members with the condition. What are the signs or symptoms?  Trouble staying asleep.  Being sleepy or tired during the day.  Getting angry a lot.  Loud snoring.  Headaches in the morning.  Not being able to focus your mind (concentrate).  Forgetting things.  Less interest in sex.  Mood swings.  Personality changes.  Feelings of sadness (depression).  Waking up a lot during the night to pee (urinate).  Dry mouth.  Sore throat. How is this diagnosed?  Your medical history.  A physical exam.  A test that is done when you are sleeping (sleep study). The test is most often done in a sleep lab but may also be done at home. How is this treated?   Sleeping on your side.  Using a medicine to get rid of mucus in your nose (decongestant).  Avoiding the use of alcohol, medicines to help you relax, or certain pain medicines (narcotics).  Losing weight, if needed.  Changing your diet.  Not smoking.  Using a machine to open your airway while you sleep, such as: ? An oral appliance. This is a mouthpiece that shifts your lower jaw forward. ? A CPAP device. This device blows air through a mask when you breathe out (exhale). ? An EPAP device. This has valves that you put in each nostril. ? A BPAP device. This device blows air through a mask when you breathe in (inhale) and breathe out.  Having surgery if other treatments do not work. It is important to get treatment  for sleep apnea. Without treatment, it can lead to:  High blood pressure.  Coronary artery disease.  In men, not being able to have an erection (impotence).  Reduced thinking ability. Follow these instructions at home: Lifestyle  Make changes that your doctor recommends.  Eat a healthy diet.  Lose weight if needed.  Avoid alcohol, medicines to help you relax, and some pain medicines.  Do not use any products that contain nicotine or tobacco, such as cigarettes, e-cigarettes, and chewing tobacco. If you need help quitting, ask your doctor. General instructions  Take over-the-counter and prescription medicines only as told by your doctor.  If you were given a machine to use while you sleep, use it only as told by your doctor.  If you are having  surgery, make sure to tell your doctor you have sleep apnea. You may need to bring your device with you.  Keep all follow-up visits as told by your doctor. This is important. Contact a doctor if:  The machine that you were given to use during sleep bothers you or does not seem to be working.  You do not get better.  You get worse. Get help right away if:  Your chest hurts.  You have trouble breathing in enough air.  You have an uncomfortable feeling in your back, arms, or stomach.  You have trouble talking.  One side of your body feels weak.  A part of your face is hanging down. These symptoms may be an emergency. Do not wait to see if the symptoms will go away. Get medical help right away. Call your local emergency services (911 in the [place] Do not drive yourself to the hospital. Summary  This condition affects breathing during sleep.  The most common cause is a collapsed or blocked airway.  The goal of treatment is to help you breathe normally while you sleep. This information is not intended to replace advice given to you by your health care provider. Make sure you discuss any questions you have with your health care  provider. Document Released: 08/03/2008 Document Revised: 08/11/2018 Document Reviewed: 06/20/2018 Elsevier Patient Education  2020 [employer].

## 2019-09-26 NOTE — Assessment & Plan Note (Signed)
Plan: Continue to work on increasing overall physical activity Work to reduce body weight Reducing body weight can help with management of comorbidities such as severe obstructive sleep apnea

## 2019-09-26 NOTE — Assessment & Plan Note (Signed)
Here for CPX HTN: On metoprolol, ambulatory BPs in the 130s.  No change OSA: Good CPAP compliance Asthma: Uses inhalers as needed Morbid obesity: Diet discussed, I do not think he is ready to do any major changes, he is very active plays tennis. Paresthesias: Multiple paresthesias as described above, all of them transit, will check a 123456, folic acid and vitamin D.  Etiology is not clear, we talk about possibly neurology referral for further eval, we agreed on hold off for now but he will let me know if symptoms persist or increase. RTC 1 year

## 2019-09-26 NOTE — Assessment & Plan Note (Signed)
Plan: Continue CPAP Use CPAP even if you are napping Continue to work on increasing physical activity to reduce body weight and BMI Work on reducing overall carbohydrate intake to decrease body weight

## 2019-09-26 NOTE — Progress Notes (Signed)
@Patient  ID: Cameron [name], male    DOB: May 05, 1946, 73 y.o.   MRN: [id number]  Chief Complaint  Patient presents with  . Follow-up    39yr f/u for OSA. Still using [employer] as DME.     Referring provider: Colon Branch, MD  HPI:  73 year old male never smoker followed in our office for severe obstructive sleep apnea  PMH: Asthma, morbid obesity, hypertension, GERD Smoker/ Smoking History: Never Smoker  Maintenance: None Pt of: Dr. Elsworth Soho  09/27/2019  - Visit   73 year old male never smoker presenting for office today for 1 year follow-up for management of his severe obstructive sleep apnea.  Patient is managed at home with a CPAP.  CPAP compliance report listed below:  08/26/2019-09/24/2019-CPAP compliance report-21 on the last 30 days used, 21 of those days greater than 4 hours, average usage 8 hours and 1 minutes, APAP settings 5-15, 95th percentile 13.2, AHI 2.9  Overall patient is doing well using his CPAP.  Patient has already received his flu vaccine this year.  Tests:   OSA was diagnosed in 1996 with PSG showing severe degree with AHI 87/hour.  FENO:  No results found for: NITRICOXIDE  PFT: No flowsheet data found.  WALK:  No flowsheet data found.  Imaging: Dg Chest 2 View  Result Date: 09/03/2019 CLINICAL DATA:  Cough and wheezing EXAM: CHEST - 2 VIEW COMPARISON:  July 03, 2018. FINDINGS: There is elevation of the right hemidiaphragm. There is some atelectasis at the lung bases bilaterally. Heart size is stable. There is no pneumothorax. No large pleural effusion. No acute osseous abnormality. IMPRESSION: No active cardiopulmonary disease. Electronically Signed   By: Constance Holster M.D.   On: 09/03/2019 15:42    Lab Results:  CBC    Component Value Date/Time   WBC 3.9 (L) 09/25/2019 1204   RBC 4.53 09/25/2019 1204   HGB 13.7 09/25/2019 1204   HCT 40.9 09/25/2019 1204   PLT 191.0 09/25/2019 1204   MCV 90.2 09/25/2019 1204   MCH 30.0 12/05/2017  0728   MCHC 33.5 09/25/2019 1204   RDW 14.2 09/25/2019 1204   LYMPHSABS 1.3 09/25/2019 1204   MONOABS 0.6 09/25/2019 1204   EOSABS 0.2 09/25/2019 1204   BASOSABS 0.0 09/25/2019 1204    BMET    Component Value Date/Time   NA 142 09/25/2019 1204   K 3.9 09/25/2019 1204   CL 104 09/25/2019 1204   CO2 32 09/25/2019 1204   GLUCOSE 82 09/25/2019 1204   BUN 14 09/25/2019 1204   CREATININE 0.86 09/25/2019 1204   CREATININE 0.90 09/10/2016 1554   CALCIUM 9.2 09/25/2019 1204   GFRNONAA >60 12/05/2017 0728   GFRAA >60 12/05/2017 0728    BNP No results found for: BNP  ProBNP    Component Value Date/Time   PROBNP 49.0 07/03/2018 1439    Specialty Problems      Pulmonary Problems   OSA on CPAP    NPSG 2001:  AHI 87/hr      Asthma      Allergies  Allergen Reactions  . Chlorhexidine Gluconate Hives and Other (See Comments)    Red rash developed on upper chest   . Ciprofloxacin Hcl Itching  . Clindamycin Itching and Rash    Immunization History  Administered Date(s) Administered  . Fluad Quad(high Dose 65+) 07/24/2019  . Influenza Split 07/09/2014  . Influenza Whole 08/09/2008, 07/10/2011  . Influenza, High Dose Seasonal PF 09/19/2018  . Influenza,inj,Quad PF,6+ Mos 07/08/2016  .  Influenza-Unspecified 08/30/2017  . Pneumococcal Conjugate-13 09/10/2016  . Pneumococcal Polysaccharide-23 07/23/2014  . Td 06/08/2006, 09/10/2016  . Zoster 04/06/2013    Past Medical History:  Diagnosis Date  . Arthritis    degenerative joint disease, knees   . Constipation due to pain medication   . Diverticulosis of colon   . Fatty liver   . GERD (gastroesophageal reflux disease)    takes Prevacid  . Glaucoma (increased eye pressure)   . HTN (hypertension)   . Iron deficiency anemia    h/o  . Keloid of skin   . Lumbar spondylosis    possible right sided radiculopathy with hx of surgery by Dr. Cyndy Freeze in 2003  . Osteopenia    DEXA 12-2006    . Personal history of colonic  polyps 2002   TUBULAR ADENOMA  . Prostatitis, acute 04-2011   admitted to [facility]  . Sleep apnea    on CPAP, last study [facility]  . Tick bite     Tobacco History: Social History   Tobacco Use  Smoking Status Never Smoker  Smokeless Tobacco Never Used   Counseling given: Yes   Continue to not smoke  Outpatient Encounter Medications as of 09/27/2019  Medication Sig  . albuterol (PROVENTIL HFA;VENTOLIN HFA) 108 (90 Base) MCG/ACT inhaler Inhale 1-2 puffs into the lungs every 6 (six) hours as needed for wheezing or shortness of breath.  . Ascorbic Acid (VITAMIN C) 1000 MG tablet Take 1,000 mg by mouth 2 (two) times daily.   Marland Kitchen aspirin 81 MG tablet Take 81 mg by mouth daily.  . brimonidine (ALPHAGAN) 0.2 % ophthalmic solution 3 drops daily.   . folic acid (FOLVITE) 1 MG tablet Take 1 tablet (1 mg total) by mouth daily.  Marland Kitchen latanoprost (XALATAN) 0.005 % ophthalmic solution Place 1 drop into both eyes at bedtime.   . metoprolol succinate (TOPROL-XL) 25 MG 24 hr tablet Take 0.5 tablets (12.5 mg total) by mouth daily.  . mometasone-formoterol (DULERA) 200-5 MCG/ACT AERO Inhale 2 puffs into the lungs 2 (two) times daily.  . Multiple Vitamin (MULTIVITAMIN WITH MINERALS) TABS tablet Take 1 tablet by mouth daily.  Marland Kitchen MYRBETRIQ 50 MG TB24 tablet Take 50 mg by mouth daily.   . solifenacin (VESICARE) 5 MG tablet Take 5 mg by mouth daily.   No facility-administered encounter medications on file as of 09/27/2019.      Review of Systems  Review of Systems  Constitutional: Negative for activity change, chills, fatigue, fever and unexpected weight change.  HENT: Negative for postnasal drip, rhinorrhea, sinus pressure, sinus pain and sore throat.   Eyes: Negative.   Respiratory: Negative for cough, shortness of breath and wheezing.   Cardiovascular: Negative for chest pain and palpitations.  Gastrointestinal: Negative for constipation, diarrhea, nausea and vomiting.  Endocrine: Negative.    Genitourinary: Negative.   Musculoskeletal: Negative.   Skin: Negative.   Neurological: Negative for dizziness and headaches.  Psychiatric/Behavioral: Negative.  Negative for dysphoric mood. The patient is not nervous/anxious.   All other systems reviewed and are negative.    Physical Exam  BP 126/84 (BP Location: Left Arm, Patient Position: Sitting, Cuff Size: Large)   Pulse 66   Temp 97.8 F (36.6 C) (Temporal)   Ht 6' (1.829 m)   Wt (!) 315 lb 12.8 oz (143.2 kg)   SpO2 97% Comment: on RA  BMI 42.83 kg/m   Wt Readings from Last 5 Encounters:  09/27/19 (!) 315 lb 12.8 oz (143.2 kg)  09/25/19 Marland Kitchen)  315 lb 4 oz (143 kg)  09/27/18 (!) 328 lb (148.8 kg)  09/19/18 (!) 324 lb 8 oz (147.2 kg)  07/18/18 (!) 325 lb 4 oz (147.5 kg)    BMI Readings from Last 5 Encounters:  09/27/19 42.83 kg/m  09/25/19 42.76 kg/m  09/27/18 44.48 kg/m  09/19/18 44.01 kg/m  07/18/18 44.11 kg/m     Physical Exam Vitals signs and nursing note reviewed.  Constitutional:      General: He is not in acute distress.    Appearance: Normal appearance. He is obese.  HENT:     Head: Normocephalic and atraumatic.     Right Ear: Hearing, tympanic membrane, ear canal and external ear normal.     Left Ear: Hearing, tympanic membrane, ear canal and external ear normal.     Nose: Nose normal. No mucosal edema or rhinorrhea.     Right Turbinates: Not enlarged.     Left Turbinates: Not enlarged.     Mouth/Throat:     Mouth: Mucous membranes are dry.     Pharynx: Oropharynx is clear. No oropharyngeal exudate.  Eyes:     Pupils: Pupils are equal, round, and reactive to light.  Neck:     Musculoskeletal: Normal range of motion.  Cardiovascular:     Rate and Rhythm: Normal rate and regular rhythm.     Pulses: Normal pulses.     Heart sounds: Normal heart sounds. No murmur.  Pulmonary:     Effort: Pulmonary effort is normal.     Breath sounds: Normal breath sounds. No decreased breath sounds, wheezing  or rales.  Abdominal:     General: Bowel sounds are normal. There is no distension.     Palpations: Abdomen is soft.     Tenderness: There is no abdominal tenderness.  Musculoskeletal:     Right lower leg: No edema.     Left lower leg: No edema.  Lymphadenopathy:     Cervical: No cervical adenopathy.  Skin:    General: Skin is warm and dry.     Capillary Refill: Capillary refill takes less than 2 seconds.     Findings: No erythema or rash.  Neurological:     General: No focal deficit present.     Mental Status: He is alert and oriented to person, place, and time.     Motor: No weakness.     Coordination: Coordination normal.     Gait: Gait is intact. Gait normal.  Psychiatric:        Mood and Affect: Mood normal.        Behavior: Behavior normal. Behavior is cooperative.        Thought Content: Thought content normal.        Judgment: Judgment normal.       Assessment & Plan:   OSA on CPAP Plan: Continue CPAP Use CPAP even if you are napping Continue to work on increasing physical activity to reduce body weight and BMI Work on reducing overall carbohydrate intake to decrease body weight  Healthcare maintenance Already received his flu vaccine  Morbid obesity Plan: Continue to work on increasing overall physical activity Work to reduce body weight Reducing body weight can help with management of comorbidities such as severe obstructive sleep apnea    Return in about 1 year (around 09/26/2020), or if symptoms worsen or fail to improve, for Follow up with Dr. Elsworth Soho, Follow up with Wyn Quaker FNP-C.   Lauraine Rinne, NP 09/27/2019   This appointment was 45  minutes long with over 50% of the time in direct face-to-face patient care, assessment, plan of care, and follow-up.

## 2019-09-26 NOTE — Assessment & Plan Note (Signed)
Already received his flu vaccine

## 2019-09-27 ENCOUNTER — Ambulatory Visit: Payer: Medicare Other | Admitting: Pulmonary Disease

## 2019-09-27 ENCOUNTER — Encounter: Payer: Self-pay | Admitting: Pulmonary Disease

## 2019-09-27 ENCOUNTER — Other Ambulatory Visit: Payer: Self-pay

## 2019-09-27 VITALS — BP 126/84 | HR 66 | Temp 97.8°F | Ht 72.0 in | Wt 315.8 lb

## 2019-09-27 DIAGNOSIS — Z0000 Encounter for general adult medical examination without abnormal findings: Secondary | ICD-10-CM

## 2019-09-27 DIAGNOSIS — Z9989 Dependence on other enabling machines and devices: Secondary | ICD-10-CM

## 2019-09-27 DIAGNOSIS — G4733 Obstructive sleep apnea (adult) (pediatric): Secondary | ICD-10-CM | POA: Diagnosis not present

## 2019-09-27 NOTE — Addendum Note (Signed)
Addended by: Valerie Salts on: 09/27/2019 09:20 AM   Modules accepted: Orders

## 2019-09-28 DIAGNOSIS — H04123 Dry eye syndrome of bilateral lacrimal glands: Secondary | ICD-10-CM | POA: Diagnosis not present

## 2019-09-28 DIAGNOSIS — H02831 Dermatochalasis of right upper eyelid: Secondary | ICD-10-CM | POA: Diagnosis not present

## 2019-09-28 DIAGNOSIS — H2513 Age-related nuclear cataract, bilateral: Secondary | ICD-10-CM | POA: Diagnosis not present

## 2019-09-28 DIAGNOSIS — H401131 Primary open-angle glaucoma, bilateral, mild stage: Secondary | ICD-10-CM | POA: Diagnosis not present

## 2019-10-01 ENCOUNTER — Telehealth: Payer: Self-pay | Admitting: Internal Medicine

## 2019-10-01 MED ORDER — VITAMIN D (ERGOCALCIFEROL) 1.25 MG (50000 UNIT) PO CAPS: 50000.0000 [IU] | ORAL_CAPSULE | ORAL | 0 refills | Status: DC

## 2019-10-01 NOTE — Telephone Encounter (Signed)
-----   Message from [place], [place] sent at 10/01/2019  8:08 AM EST ----- Results released. Ergocalciferol sent to [facility].   Sherri- Pt needs OV w/ PCP in 6 months instead of 1 year please.

## 2019-10-01 NOTE — Addendum Note (Signed)
Addended byDamita Dunnings D on: 10/01/2019 08:07 AM   Modules accepted: Orders

## 2019-10-01 NOTE — Telephone Encounter (Signed)
CALLED PT FOR 6 MONTH FOLLOW UP LEFT MESSAGE

## 2019-10-07 ENCOUNTER — Telehealth: Payer: Medicare Other | Admitting: Family

## 2019-10-07 DIAGNOSIS — R059 Cough, unspecified: Secondary | ICD-10-CM

## 2019-10-07 DIAGNOSIS — J4530 Mild persistent asthma, uncomplicated: Secondary | ICD-10-CM

## 2019-10-07 DIAGNOSIS — R05 Cough: Secondary | ICD-10-CM

## 2019-10-07 MED ORDER — ALBUTEROL SULFATE HFA 108 (90 BASE) MCG/ACT IN AERS: 2.0000 | INHALATION_SPRAY | Freq: Four times a day (QID) | RESPIRATORY_TRACT | 0 refills | Status: DC | PRN

## 2019-10-07 MED ORDER — PREDNISONE 20 MG PO TABS: 40.0000 mg | ORAL_TABLET | Freq: Every day | ORAL | 0 refills | Status: AC

## 2019-10-07 NOTE — Progress Notes (Signed)
Visit for Asthma  Based on what you have shared with me, it looks like you may have a flare up of your asthma.  Asthma is a chronic (ongoing) lung disease which results in airway obstruction, inflammation and hyper-responsiveness.   Asthma symptoms vary from person to person, with common symptoms including nighttime awakening and decreased ability to participate in normal activities as a result of shortness of breath. It is often triggered by changes in weather, changes in the season, changes in air temperature, or inside (home, school, daycare or work) allergens such as animal dander, mold, mildew, woodstoves or cockroaches.   It can also be triggered by hormonal changes, extreme emotion, physical exertion or an upper respiratory tract illness.     It is important to identify the trigger, and then eliminate or avoid the trigger if possible.   If you have been prescribed medications to be taken on a regular basis, it is important to follow the asthma action plan and to follow guidelines to adjust medication in response to increasing symptoms of decreased peak expiratory flow rate  Treatment: I have prescribed: Albuterol (Proventil HFA; Ventolin HFA) 108 (90 Base) MCG/ACT Inhaler 2 puffs into the lungs every six hours as needed for wheezing or shortness of breath and Prednisone 40mg  by mouth per day for  7 days.  If your symptoms worsen, you need to be seen face to face in the next 24-48 hours.   Given you have been around others with COVID, you need to be tested. You can go to one of the testing sites listed below, while they are opened (see hours). You do not need an order and will stay in your car during the test. You do need to self isolate until your results return and if positive 14 days from when your symptoms started and until you are 3 days fever free.   Testing Locations (Monday -  Friday, 10 a.m. - 3 p.m.) . [place]: [facility] at [facility], [address] [place], [place]: [place], [place], [place], [place] (entrance [address])  . [facility]: (Closed each Monday): Testing site relocated to the short stay covered drive at [facility]. (Use the [employer] entrance to [facility] next to [place] . Only take medications as instructed by your medical team. . Consider wearing a mask or scarf to improve breathing air temperature have been shown to decrease irritation and decrease exacerbations . Get rest. . Taking a steamy shower or using a humidifier may help nasal congestion sand ease sore throat pain. You can place a towel over your head and breathe in the steam from hot water coming from a faucet. . Using a saline nasal spray works much the same way.  . Cough drops, hare candies and sore throat lozenges may ease your cough.  . Avoid close contacts especially the very you and the elderly . Cover your mouth if you cough or sneeze . Always remember to wash your hands.    GET HELP RIGHT AWAY IF: . You develop worsening symptoms; breathlessness at rest, drowsy, confused or agitated, unable to speak in full sentences . You have coughing fits . You develop a severe headache or visual changes . You develop shortness of breath, difficulty breathing or start having chest pain . Your symptoms persist after you have completed your treatment plan . If your symptoms do not improve within  10 days  MAKE SURE YOU . Understand these instructions. . Will watch your condition. . Will get help right away if you are not doing well or get worse.   Your e-visit answers were reviewed by a board certified advanced clinical practitioner to complete your personal care plan, Depending upon the condition, your plan could have included both over the counter or  prescription medications.  Please review your pharmacy choice. Your safety is important to [place]. If you have drug allergies check your prescription carefully. You can use MyChart to ask questions about today's visit, request a non-urgent call back, or ask for a work or school excuse for 24 hours related to this e-Visit. If it has been greater than 24 hours you will need to follow up with your provider, or enter a new e-Visit to address those concerns.  You will get an e-mail in the next two days asking about your experience. I hope that your e-visit has been valuable and will speed your recovery. Thank you for using e-visits.  Approximately 5 minutes was spent documenting and reviewing patient's chart.

## 2019-10-10 DIAGNOSIS — G4733 Obstructive sleep apnea (adult) (pediatric): Secondary | ICD-10-CM | POA: Diagnosis not present

## 2019-10-15 NOTE — Progress Notes (Signed)
Virtual Visit via Video Note  I connected with patient on 10/16/19 at  8:00 AM EST by audio enabled telemedicine application and verified that I am speaking with the correct person using two identifiers.   THIS ENCOUNTER IS A VIRTUAL VISIT DUE TO COVID-19 - PATIENT WAS NOT SEEN IN THE OFFICE. PATIENT HAS CONSENTED TO VIRTUAL VISIT / TELEMEDICINE VISIT   Location of patient: home  Location of provider: office  I discussed the limitations of evaluation and management by telemedicine and the availability of in person appointments. The patient expressed understanding and agreed to proceed.   Subjective:   Cameron [name] is a 73 y.o. male who presents for Medicare Annual/Subsequent preventive examination.  Review of Systems:  Home Safety/Smoke Alarms: Feels safe in home. Smoke alarms in place.  Lives w/ wife in 2 story home. Master on 1st. Walk-in shower. Verbalizes great support system.   Male:   CCS-    11/30/16. Recall 3 yrs. PSA-  Lab Results  Component Value Date   PSA 0.68 03/30/2012   PSA 0.68 01/08/2010   PSA 0.51 08/14/2008   Eye- Dr.Susan Jerline Pain w/ [place]. Every 6 months.     Objective:    Vitals: Unable to assess. This visit is enabled though telemedicine due to Covid 19.  Advanced Directives 10/16/2019 12/05/2017 10/30/2017 01/16/2016 05/10/2015 10/08/2014 09/30/2014  Does Patient Have a Medical Advance Directive? No No No No No No No  Does patient want to make changes to medical advance directive? No - Patient declined - - - - - -  Would patient like information on creating a medical advance directive? - No - Patient declined - - - No - patient declined information Yes Higher education careers adviser given    Tobacco Social History   Tobacco Use  Smoking Status Never Smoker  Smokeless Tobacco Never Used     Counseling given: Not Answered   Clinical Intake: Pain : No/denies pain     Past Medical History:  Diagnosis Date  . Arthritis    degenerative  joint disease, knees   . Constipation due to pain medication   . Diverticulosis of colon   . Fatty liver   . GERD (gastroesophageal reflux disease)    takes Prevacid  . Glaucoma (increased eye pressure)   . HTN (hypertension)   . Iron deficiency anemia    h/o  . Keloid of skin   . Lumbar spondylosis    possible right sided radiculopathy with hx of surgery by Dr. Cyndy Freeze in 2003  . Osteopenia    DEXA 12-2006    . Personal history of colonic polyps 2002   TUBULAR ADENOMA  . Prostatitis, acute 04-2011   admitted to [facility]  . Sleep apnea    on CPAP, last study [facility]  . Tick bite    Past Surgical History:  Procedure Laterality Date  . BACK SURGERY  2003  . CHOLECYSTECTOMY  2003  . COLONOSCOPY    . LAPAROSCOPIC APPENDECTOMY N/A 01/16/2016   Procedure: APPENDECTOMY LAPAROSCOPIC;  Surgeon: Fanny Skates, MD;  Location: [place];  Service: General;  Laterality: N/A;  . LUMBAR LAMINECTOMY/DECOMPRESSION MICRODISCECTOMY N/A 08/22/2015   Procedure: Lumbar two to lumbar three lumbar three to lumbar four microdiskectomy;  Surgeon: Ashok Pall, MD;  Location: [place] [facility];  Service: Neurosurgery;  Laterality: N/A;  L23 L34 microdiskectomy  . TOTAL KNEE ARTHROPLASTY Right 06/11/2014   Procedure: TOTAL KNEE ARTHROPLASTY;  Surgeon: Hessie Dibble, MD;  Location: [place];  Service:  Orthopedics;  Laterality: Right;  . TOTAL KNEE ARTHROPLASTY Left 10/08/2014   DR DALLDORF  . TOTAL KNEE ARTHROPLASTY Left 10/08/2014   Procedure: TOTAL LEFT KNEE ARTHROPLASTY;  Surgeon: Hessie Dibble, MD;  Location: [place];  Service: Orthopedics;  Laterality: Left;   Family History  Problem Relation Age of Onset  . Dementia Mother   . Diverticulitis Father   . Colon cancer Maternal Uncle        dx age 28s  . Colon cancer Other        grandfather, dx age 38  . Stroke Other        maternal aunt in her 29s  . Breast cancer Maternal Aunt   . Prostate cancer Neg Hx   . Heart attack Neg Hx   . Hypertension Neg Hx   .  Diabetes Neg Hx    Social History   Socioeconomic History  . Marital status: Married    Spouse name: Not on file  . Number of children: 2  . Years of education: Not on file  . Highest education level: Not on file  Occupational History  . Occupation: retired--works in [place]  . Financial resource strain: Not on file  . Food insecurity    Worry: Not on file    Inability: Not on file  . Transportation needs    Medical: Not on file    Non-medical: Not on file  Tobacco Use  . Smoking status: Never Smoker  . Smokeless tobacco: Never Used  Substance and Sexual Activity  . Alcohol use: Yes    Comment: most at 1/month  . Drug use: No  . Sexual activity: Not on file  Lifestyle  . Physical activity    Days per week: Not on file    Minutes per session: Not on file  . Stress: Not on file  Relationships  . Social Herbalist on phone: Not on file    Gets together: Not on file    Attends religious service: Not on file    Active member of club or organization: Not on file    Attends meetings of clubs or organizations: Not on file    Relationship status: Not on file  Other Topics Concern  . Not on file  Social History Narrative   Household- pt and wife   plays tennis sometimes     Outpatient Encounter Medications as of 10/16/2019  Medication Sig  . albuterol (VENTOLIN HFA) 108 (90 Base) MCG/ACT inhaler Inhale 2 puffs into the lungs every 6 (six) hours as needed for wheezing or shortness of breath.  . Ascorbic Acid (VITAMIN C) 1000 MG tablet Take 1,000 mg by mouth 2 (two) times daily.   Marland Kitchen aspirin 81 MG tablet Take 81 mg by mouth daily.  . brimonidine (ALPHAGAN) 0.2 % ophthalmic solution 3 drops daily.   . folic acid (FOLVITE) 1 MG tablet Take 1 tablet (1 mg total) by mouth daily.  Marland Kitchen latanoprost (XALATAN) 0.005 % ophthalmic solution Place 1 drop into both eyes at bedtime.   . metoprolol succinate (TOPROL-XL) 25 MG 24 hr tablet Take 0.5 tablets (12.5 mg total) by  mouth daily.  . Multiple Vitamin (MULTIVITAMIN WITH MINERALS) TABS tablet Take 1 tablet by mouth daily.  Marland Kitchen MYRBETRIQ 50 MG TB24 tablet Take 50 mg by mouth daily.   . solifenacin (VESICARE) 5 MG tablet Take 5 mg by mouth daily.  . Vitamin D, Ergocalciferol, (DRISDOL) 1.25 MG (50000 UT) CAPS capsule  Take 1 capsule (50,000 Units total) by mouth every 7 (seven) days.  . mometasone-formoterol (DULERA) 200-5 MCG/ACT AERO Inhale 2 puffs into the lungs 2 (two) times daily. (Patient not taking: Reported on 10/16/2019)   No facility-administered encounter medications on file as of 10/16/2019.     Activities of Daily Living In your present state of health, do you have any difficulty performing the following activities: 10/16/2019 09/25/2019  Hearing? N N  Vision? N N  Difficulty concentrating or making decisions? N N  Walking or climbing stairs? N N  Dressing or bathing? N N  Doing errands, shopping? N N  Preparing Food and eating ? N -  Using the Toilet? N -  In the past six months, have you accidently leaked urine? N -  Do you have problems with loss of bowel control? N -  Managing your Medications? N -  Managing your Finances? N -  Housekeeping or managing your Housekeeping? N -  Some recent data might be hidden    Patient Care Team: Colon Branch, MD as PCP - General Melrose Nakayama, MD as Consulting Physician (Orthopedic Surgery) Ulyses Southward., MD (Urology) Rigoberto Noel, MD as Consulting Physician (Pulmonary Disease)   Assessment:   This is a routine wellness examination for Delrae Trayvion. Physical assessment deferred to PCP.  Exercise Activities and Dietary recommendations Current Exercise Habits: Home exercise routine, Time (Minutes): 60, Frequency (Times/Week): 1, Weekly Exercise (Minutes/Week): 60, Exercise limited by: None identified Diet (meal preparation, eat out, water intake, caffeinated beverages, dairy products, fruits and vegetables): well balanced   Goals    . Patient  Stated     Maintain healthy lifestyle.       Fall Risk Fall Risk  10/16/2019 09/25/2019 09/19/2018 09/14/2017 09/10/2016  Falls in the past year? 0 1 0 No No  Number falls in past yr: - 0 - - -  Injury with Fall? - 0 - - -  Follow up Education provided;Falls prevention discussed Falls evaluation completed Falls evaluation completed - -    Depression Screen PHQ 2/9 Scores 10/16/2019 09/25/2019 09/19/2018 09/14/2017  PHQ - 2 Score 0 0 0 0    Cognitive Function Ad8 score reviewed for issues:  Issues making decisions:no  Less interest in hobbies / activities:no  Repeats questions, stories (family complaining):no  Trouble using ordinary gadgets (microwave, computer, phone):no  Forgets the month or year: no  Mismanaging finances: no  Remembering appts:no Daily problems with thinking and/or memory:no Ad8 score is=0         Immunization History  Administered Date(s) Administered  . Fluad Quad(high Dose 65+) 07/24/2019  . Influenza Split 07/09/2014  . Influenza Whole 08/09/2008, 07/10/2011  . Influenza, High Dose Seasonal PF 09/19/2018  . Influenza,inj,Quad PF,6+ Mos 07/08/2016  . Influenza-Unspecified 08/30/2017  . Pneumococcal Conjugate-13 09/10/2016  . Pneumococcal Polysaccharide-23 07/23/2014  . Td 06/08/2006, 09/10/2016  . Zoster 04/06/2013     Screening Tests Health Maintenance  Topic Date Due  . COLONOSCOPY  12/01/2019  . TETANUS/TDAP  09/10/2026  . INFLUENZA VACCINE  Completed  . Hepatitis C Screening  Completed  . PNA vac Low Risk Adult  Completed      Plan:   See you next year!  Continue to eat heart healthy diet (full of fruits, vegetables, whole grains, lean protein, water--limit salt, fat, and sugar intake) and increase physical activity as tolerated.  Continue doing brain stimulating activities (puzzles, reading, adult coloring books, staying active) to keep memory sharp.   Bring a  copy of your living will and/or healthcare power of attorney to  your next office visit.   I have personally reviewed and noted the following in the patient's chart:   . Medical and social history . Use of alcohol, tobacco or illicit drugs  . Current medications and supplements . Functional ability and status . Nutritional status . Physical activity . Advanced directives . List of other physicians . Hospitalizations, surgeries, and ER visits in previous 12 months . Vitals . Screenings to include cognitive, depression, and falls . Referrals and appointments  In addition, I have reviewed and discussed with patient certain preventive protocols, quality metrics, and best practice recommendations. A written personalized care plan for preventive services as well as general preventive health recommendations were provided to patient.     Shela Nevin, [place]  10/16/2019

## 2019-10-16 ENCOUNTER — Other Ambulatory Visit: Payer: Self-pay

## 2019-10-16 ENCOUNTER — Ambulatory Visit (INDEPENDENT_AMBULATORY_CARE_PROVIDER_SITE_OTHER): Payer: Medicare Other | Admitting: *Deleted

## 2019-10-16 ENCOUNTER — Encounter: Payer: Self-pay | Admitting: *Deleted

## 2019-10-16 DIAGNOSIS — Z0000 Encounter for general adult medical examination without abnormal findings: Secondary | ICD-10-CM

## 2019-10-16 NOTE — Patient Instructions (Signed)
See you next year!  Continue to eat heart healthy diet (full of fruits, vegetables, whole grains, lean protein, water--limit salt, fat, and sugar intake) and increase physical activity as tolerated.  Continue doing brain stimulating activities (puzzles, reading, adult coloring books, staying active) to keep memory sharp.   Bring a copy of your living will and/or healthcare power of attorney to your next office visit.   Cameron [name] , Thank you for taking time to come for your Medicare Wellness Visit. I appreciate your ongoing commitment to your health goals. Please review the following plan we discussed and let me know if I can assist you in the future.   These are the goals we discussed: Goals    . Patient Stated     Maintain healthy lifestyle.       This is a list of the screening recommended for you and due dates:  Health Maintenance  Topic Date Due  . Colon Cancer Screening  12/01/2019  . Tetanus Vaccine  09/10/2026  . Flu Shot  Completed  .  Hepatitis C: One time screening is recommended by Center for Disease Control  (CDC) for  adults born from [birth year] through 1963.   Completed  . Pneumonia vaccines  Completed    Preventive Care 67 Years and Older, Male Preventive care refers to lifestyle choices and visits with your health care provider that can promote health and wellness. This includes:  A yearly physical exam. This is also called an annual well check.  Regular dental and eye exams.  Immunizations.  Screening for certain conditions.  Healthy lifestyle choices, such as diet and exercise. What can I expect for my preventive care visit? Physical exam Your health care provider will check:  Height and weight. These may be used to calculate body mass index (BMI), which is a measurement that tells if you are at a healthy weight.  Heart rate and blood pressure.  Your skin for abnormal spots. Counseling Your health care provider may ask you questions about:   Alcohol, tobacco, and drug use.  Emotional well-being.  Home and relationship well-being.  Sexual activity.  Eating habits.  History of falls.  Memory and ability to understand (cognition).  Work and work Statistician. What immunizations do I need?  Influenza (flu) vaccine  This is recommended every year. Tetanus, diphtheria, and pertussis (Tdap) vaccine  You may need a Td booster every 10 years. Varicella (chickenpox) vaccine  You may need this vaccine if you have not already been vaccinated. Zoster (shingles) vaccine  You may need this after age 78. Pneumococcal conjugate (PCV13) vaccine  One dose is recommended after age 38. Pneumococcal polysaccharide (PPSV23) vaccine  One dose is recommended after age 62. Measles, mumps, and rubella (MMR) vaccine  You may need at least one dose of MMR if you were born in 1957 or later. You may also need a second dose. Meningococcal conjugate (MenACWY) vaccine  You may need this if you have certain conditions. Hepatitis A vaccine  You may need this if you have certain conditions or if you travel or work in places where you may be exposed to hepatitis A. Hepatitis B vaccine  You may need this if you have certain conditions or if you travel or work in places where you may be exposed to hepatitis B. Haemophilus influenzae type b (Hib) vaccine  You may need this if you have certain conditions. You may receive vaccines as individual doses or as more than one vaccine together in one shot (  combination vaccines). Talk with your health care provider about the risks and benefits of combination vaccines. What tests do I need? Blood tests  Lipid and cholesterol levels. These may be checked every 5 years, or more frequently depending on your overall health.  Hepatitis C test.  Hepatitis B test. Screening  Lung cancer screening. You may have this screening every year starting at age 13 if you have a 30-pack-year history of smoking  and currently smoke or have quit within the past 15 years.  Colorectal cancer screening. All adults should have this screening starting at age 29 and continuing until age 23. Your health care provider may recommend screening at age 58 if you are at increased risk. You will have tests every 1-10 years, depending on your results and the type of screening test.  Prostate cancer screening. Recommendations will vary depending on your family history and other risks.  Diabetes screening. This is done by checking your blood sugar (glucose) after you have not eaten for a while (fasting). You may have this done every 1-3 years.  Abdominal aortic aneurysm (AAA) screening. You may need this if you are a current or former smoker.  Sexually transmitted disease (STD) testing. Follow these instructions at home: Eating and drinking  Eat a diet that includes fresh fruits and vegetables, whole grains, lean protein, and low-fat dairy products. Limit your intake of foods with high amounts of sugar, saturated fats, and salt.  Take vitamin and mineral supplements as recommended by your health care provider.  Do not drink alcohol if your health care provider tells you not to drink.  If you drink alcohol: ? Limit how much you have to 0-2 drinks a day. ? Be aware of how much alcohol is in your drink. In the [place] one drink equals one 12 oz bottle of beer (355 mL), one 5 oz glass of wine (148 mL), or one 1 oz glass of hard liquor (44 mL). Lifestyle  Take daily care of your teeth and gums.  Stay active. Exercise for at least 30 minutes on 5 or more days each week.  Do not use any products that contain nicotine or tobacco, such as cigarettes, e-cigarettes, and chewing tobacco. If you need help quitting, ask your health care provider.  If you are sexually active, practice safe sex. Use a condom or other form of protection to prevent STIs (sexually transmitted infections).  Talk with your health care provider  about taking a low-dose aspirin or statin. What's next?  Visit your health care provider once a year for a well check visit.  Ask your health care provider how often you should have your eyes and teeth checked.  Stay up to date on all vaccines. This information is not intended to replace advice given to you by your health care provider. Make sure you discuss any questions you have with your health care provider. Document Released: 11/21/2015 Document Revised: 10/19/2018 Document Reviewed: 10/19/2018 Elsevier Patient Education  2020 [employer].

## 2020-01-13 ENCOUNTER — Other Ambulatory Visit: Payer: Self-pay | Admitting: Internal Medicine

## 2020-01-28 DIAGNOSIS — H04123 Dry eye syndrome of bilateral lacrimal glands: Secondary | ICD-10-CM | POA: Diagnosis not present

## 2020-01-28 DIAGNOSIS — H2513 Age-related nuclear cataract, bilateral: Secondary | ICD-10-CM | POA: Diagnosis not present

## 2020-01-28 DIAGNOSIS — H5203 Hypermetropia, bilateral: Secondary | ICD-10-CM | POA: Diagnosis not present

## 2020-01-28 DIAGNOSIS — H401131 Primary open-angle glaucoma, bilateral, mild stage: Secondary | ICD-10-CM | POA: Diagnosis not present

## 2020-03-10 ENCOUNTER — Other Ambulatory Visit: Payer: Self-pay

## 2020-03-10 ENCOUNTER — Encounter: Payer: Self-pay | Admitting: Internal Medicine

## 2020-03-10 ENCOUNTER — Ambulatory Visit (INDEPENDENT_AMBULATORY_CARE_PROVIDER_SITE_OTHER): Payer: Medicare Other | Admitting: Internal Medicine

## 2020-03-10 VITALS — BP 132/65 | HR 72 | Temp 96.8°F | Resp 18 | Ht 72.0 in | Wt 312.0 lb

## 2020-03-10 DIAGNOSIS — E785 Hyperlipidemia, unspecified: Secondary | ICD-10-CM

## 2020-03-10 DIAGNOSIS — E538 Deficiency of other specified B group vitamins: Secondary | ICD-10-CM | POA: Diagnosis not present

## 2020-03-10 DIAGNOSIS — E559 Vitamin D deficiency, unspecified: Secondary | ICD-10-CM | POA: Diagnosis not present

## 2020-03-10 LAB — B12 AND FOLATE PANEL
Folate: >23.7 ng/mL (ref >5.9)
Vitamin B-12: 231 pg/mL (ref 211–911)

## 2020-03-10 LAB — LIPID PANEL
Cholesterol: 146 mg/dL (ref 0–200)
HDL: 41.9 mg/dL (ref >39.00)
LDL Cholesterol: 94 mg/dL (ref 0–99)
NonHDL: 103.65
Total CHOL/HDL Ratio: 3
Triglycerides: 46 mg/dL (ref 0.0–149.0)
VLDL: 9.2 mg/dL (ref 0.0–40.0)

## 2020-03-10 LAB — VITAMIN D 25 HYDROXY (VIT D DEFICIENCY, FRACTURES): VITD: 32.34 ng/mL (ref 30.00–100.00)

## 2020-03-10 NOTE — Patient Instructions (Addendum)
Please schedule Medicare Wellness with Glenard Haring.   Check your blood pressure once or twice a month BP GOAL is between 110/65 and  135/85. If it is consistently higher or lower, let me know  Continue taking over-the-counter vitamin D and B12 supplements  GO TO THE LAB : Get the blood work     [place], [place] back for a physical exam in 6 months

## 2020-03-10 NOTE — Assessment & Plan Note (Addendum)
    HTN: Seems well controlled, continue metoprolol. Asthma: Not an issue at this time, uses inhalers as needed Vitamin D and vitamin B12 deficiency: Status post ergocalciferol, now on OTC B12 and vitamin D .  Recheck levels Hyperlipidemia: Discussed with patient his 10-year CV risk based on last FLP, it is 20.2%.  Ideal less than 10%.  We will recheck a FLP, pros and cons of statins discussed.  He seemed to be willing to proceed with medication if needed. Morbid obesity: Encouraged to remain active Preventive care: Had Covid shots already RTC 6 months CPX

## 2020-03-10 NOTE — Progress Notes (Signed)
Pre visit review using our clinic review tool, if applicable. No additional management support is needed unless otherwise documented below in the visit note. 

## 2020-03-10 NOTE — Progress Notes (Signed)
Subjective:    Patient ID: Cameron [name], male    DOB: 1946/01/20, 74 y.o.   MRN: [id number]  DOS:  03/10/2020 Type of visit - description: Routine visit Today we talk about hypertension, high cholesterol and vitamin deficiencies. Also, he has developed some numbness and the external side of the left fifth finger. This started few months ago, it is a steady symptom, denies elbow or neck pain.  No worse symptoms at night   Review of Systems See above   Past Medical History:  Diagnosis Date  . Arthritis    degenerative joint disease, knees   . Constipation due to pain medication   . Diverticulosis of colon   . Fatty liver   . GERD (gastroesophageal reflux disease)    takes Prevacid  . Glaucoma (increased eye pressure)   . HTN (hypertension)   . Iron deficiency anemia    h/o  . Keloid of skin   . Lumbar spondylosis    possible right sided radiculopathy with hx of surgery by Dr. Cyndy Freeze in 2003  . Osteopenia    DEXA 12-2006    . Personal history of colonic polyps 2002   TUBULAR ADENOMA  . Prostatitis, acute 04-2011   admitted to [facility]  . Sleep apnea    on CPAP, last study [facility]  . Tick bite     Past Surgical History:  Procedure Laterality Date  . BACK SURGERY  2003  . CHOLECYSTECTOMY  2003  . COLONOSCOPY    . LAPAROSCOPIC APPENDECTOMY N/A 01/16/2016   Procedure: APPENDECTOMY LAPAROSCOPIC;  Surgeon: Fanny Skates, MD;  Location: [place];  Service: General;  Laterality: N/A;  . LUMBAR LAMINECTOMY/DECOMPRESSION MICRODISCECTOMY N/A 08/22/2015   Procedure: Lumbar two to lumbar three lumbar three to lumbar four microdiskectomy;  Surgeon: Ashok Pall, MD;  Location: [place] [facility];  Service: Neurosurgery;  Laterality: N/A;  L23 L34 microdiskectomy  . TOTAL KNEE ARTHROPLASTY Right 06/11/2014   Procedure: TOTAL KNEE ARTHROPLASTY;  Surgeon: Hessie Dibble, MD;  Location: [place];  Service: Orthopedics;  Laterality: Right;  . TOTAL KNEE ARTHROPLASTY Left 10/08/2014   DR DALLDORF  .  TOTAL KNEE ARTHROPLASTY Left 10/08/2014   Procedure: TOTAL LEFT KNEE ARTHROPLASTY;  Surgeon: Hessie Dibble, MD;  Location: [place];  Service: Orthopedics;  Laterality: Left;    Allergies as of 03/10/2020      Reactions   Chlorhexidine Gluconate Hives, Other (See Comments)   Red rash developed on upper chest   Ciprofloxacin Hcl Itching   Clindamycin Itching, Rash      Medication List       Accurate as of Mar 10, 2020  2:47 PM. If you have any questions, ask your nurse or doctor.        STOP taking these medications   Vitamin D (Ergocalciferol) 1.25 MG (50000 UNIT) Caps capsule Commonly known as: DRISDOL Stopped by: Kathlene November, MD     TAKE these medications   albuterol 108 (90 Base) MCG/ACT inhaler Commonly known as: VENTOLIN HFA Inhale 2 puffs into the lungs every 6 (six) hours as needed for wheezing or shortness of breath.   aspirin 81 MG tablet Take 81 mg by mouth daily.   brimonidine 0.2 % ophthalmic solution Commonly known as: ALPHAGAN 3 drops daily.   folic acid 1 MG tablet Commonly known as: FOLVITE Take 1 tablet (1 mg total) by mouth daily.   latanoprost 0.005 % ophthalmic solution Commonly known as: XALATAN Place 1 drop into both eyes at  bedtime.   metoprolol succinate 25 MG 24 hr tablet Commonly known as: TOPROL-XL TAKE ONE-HALF TABLET BY  MOUTH DAILY   mometasone-formoterol 200-5 MCG/ACT Aero Commonly known as: Dulera Inhale 2 puffs into the lungs 2 (two) times daily.   multivitamin with minerals Tabs tablet Take 1 tablet by mouth daily.   Myrbetriq 50 MG Tb24 tablet Generic drug: mirabegron ER Take 50 mg by mouth daily.   solifenacin 5 MG tablet Commonly known as: VESICARE Take 5 mg by mouth daily.   vitamin C 1000 MG tablet Take 1,000 mg by mouth 2 (two) times daily.          Objective:   Physical Exam BP 132/65 (BP Location: Left Arm, Patient Position: Sitting, Cuff Size: Normal)   Pulse 72   Temp (!) 96.8 F (36 C) (Temporal)   Resp  18   Ht 6' (1.829 m)   Wt (!) 312 lb (141.5 kg)   SpO2 98%   BMI 42.31 kg/m  General:   Well developed, NAD, BMI noted. HEENT:  Normocephalic . Face symmetric, atraumatic Lungs:  CTA B Normal respiratory effort, no intercostal retractions, no accessory muscle use. Heart: RRR,  no murmur.  Lower extremities: no pretibial edema bilaterally Upper extremities: Normal to inspection and palpation Skin: Not pale. Not jaundice Neurologic:  alert & oriented X3.  Speech normal, gait appropriate for age and unassisted Psych--  Cognition and judgment appear intact.  Cooperative with normal attention span and concentration.  Behavior appropriate. No anxious or depressed appearing.      Assessment     Assessment Hypertension OSA on CPAP GI: GERD, Fatty liver Asthma, previously RAD (occ wheezing w/ URIs, inhalers) DJD: lumbar spondylosis, back surgery 2003, knee replacements Morbid obesity  GU: --LUTS  Dr Venia Minks ([facility]) --h/o prostatitis admitted 2012 [facility] Glaucoma Osteopenia per DEXA 2008, multiple attempts to redo bone density test failed.  h/o iron deficiency  H/o  B12 and vit D def, 2016 levels wnl  PLAN:   HTN: Seems well controlled, continue metoprolol. Asthma: Not an issue at this time, uses inhalers as needed Vitamin D and vitamin B12 deficiency: Status post ergocalciferol, now on OTC B12 and vitamin D .  Recheck levels Hyperlipidemia: Discussed with patient his 10-year CV risk based on last FLP, it is 20.2%.  Ideal less than 10%.  We will recheck a FLP, pros and cons of statins discussed.  He seemed to be willing to proceed with medication if needed. Morbid obesity: Encouraged to remain active Preventive care: Had Covid shots already RTC 6 months CPX     This visit occurred during the SARS-CoV-2 public health emergency.  Safety protocols were in place, including screening questions prior to the visit, additional usage of staff PPE, and extensive cleaning of exam room  while observing appropriate contact time as indicated for disinfecting solutions.

## 2020-03-12 ENCOUNTER — Other Ambulatory Visit: Payer: Self-pay | Admitting: Internal Medicine

## 2020-03-12 MED ORDER — ATORVASTATIN CALCIUM 10 MG PO TABS
10.0000 mg | ORAL_TABLET | Freq: Every day | ORAL | 1 refills | Status: DC
Start: 2020-03-12 — End: 2020-04-22

## 2020-03-12 NOTE — Addendum Note (Signed)
Addended byDamita Dunnings D on: 03/12/2020 03:46 PM   Modules accepted: Orders

## 2020-04-22 ENCOUNTER — Other Ambulatory Visit: Payer: Self-pay

## 2020-04-22 ENCOUNTER — Other Ambulatory Visit (INDEPENDENT_AMBULATORY_CARE_PROVIDER_SITE_OTHER): Payer: Medicare Other

## 2020-04-22 DIAGNOSIS — E785 Hyperlipidemia, unspecified: Secondary | ICD-10-CM | POA: Diagnosis not present

## 2020-04-22 LAB — LIPID PANEL
Cholesterol: 122 mg/dL (ref 0–200)
HDL: 40.9 mg/dL (ref >39.00)
LDL Cholesterol: 71 mg/dL (ref 0–99)
NonHDL: 81.5
Total CHOL/HDL Ratio: 3
Triglycerides: 54 mg/dL (ref 0.0–149.0)
VLDL: 10.8 mg/dL (ref 0.0–40.0)

## 2020-04-22 LAB — ALT: ALT: 21 U/L (ref 0–53)

## 2020-04-22 LAB — AST: AST: 20 U/L (ref 0–37)

## 2020-04-22 MED ORDER — ATORVASTATIN CALCIUM 10 MG PO TABS: 10.0000 mg | ORAL_TABLET | Freq: Every day | ORAL | 3 refills | Status: DC

## 2020-04-22 NOTE — Addendum Note (Signed)
Addended byDamita Dunnings D on: 04/22/2020 12:41 PM   Modules accepted: Orders

## 2020-04-28 ENCOUNTER — Encounter (HOSPITAL_BASED_OUTPATIENT_CLINIC_OR_DEPARTMENT_OTHER): Payer: Self-pay

## 2020-04-28 ENCOUNTER — Other Ambulatory Visit: Payer: Self-pay

## 2020-04-28 ENCOUNTER — Emergency Department (HOSPITAL_BASED_OUTPATIENT_CLINIC_OR_DEPARTMENT_OTHER)
Admission: EM | Admit: 2020-04-28 | Discharge: 2020-04-28 | Disposition: A | Discharge status: Home / Self Care | Payer: Medicare Other | Attending: Emergency Medicine | Admitting: Emergency Medicine

## 2020-04-28 DIAGNOSIS — Z7982 Long term (current) use of aspirin: Secondary | ICD-10-CM | POA: Diagnosis not present

## 2020-04-28 DIAGNOSIS — Z79899 Other long term (current) drug therapy: Secondary | ICD-10-CM | POA: Insufficient documentation

## 2020-04-28 DIAGNOSIS — I10 Essential (primary) hypertension: Secondary | ICD-10-CM | POA: Diagnosis not present

## 2020-04-28 DIAGNOSIS — J45909 Unspecified asthma, uncomplicated: Secondary | ICD-10-CM | POA: Diagnosis not present

## 2020-04-28 DIAGNOSIS — K5790 Diverticulosis of intestine, part unspecified, without perforation or abscess without bleeding: Secondary | ICD-10-CM | POA: Insufficient documentation

## 2020-04-28 DIAGNOSIS — K921 Melena: Secondary | ICD-10-CM | POA: Diagnosis not present

## 2020-04-28 DIAGNOSIS — K5730 Diverticulosis of large intestine without perforation or abscess without bleeding: Secondary | ICD-10-CM | POA: Diagnosis not present

## 2020-04-28 HISTORY — DX: Pure hypercholesterolemia, unspecified: E78.00

## 2020-04-28 LAB — COMPREHENSIVE METABOLIC PANEL
ALT: 22 U/L (ref 0–44)
AST: 21 U/L (ref 15–41)
Albumin: 3.8 g/dL (ref 3.5–5.0)
Alkaline Phosphatase: 94 U/L (ref 38–126)
Anion gap: 10 (ref 5–15)
BUN: 12 mg/dL (ref 8–23)
CO2: 25 mmol/L (ref 22–32)
Calcium: 8.8 mg/dL — ABNORMAL LOW (ref 8.9–10.3)
Chloride: 106 mmol/L (ref 98–111)
Creatinine, Ser: 0.83 mg/dL (ref 0.61–1.24)
GFR calc Af Amer: >60 mL/min (ref >60)
GFR calc non Af Amer: >60 mL/min (ref >60)
Glucose, Bld: 110 mg/dL — ABNORMAL HIGH (ref 70–99)
Potassium: 4 mmol/L (ref 3.5–5.1)
Sodium: 141 mmol/L (ref 135–145)
Total Bilirubin: 0.7 mg/dL (ref 0.3–1.2)
Total Protein: 6.9 g/dL (ref 6.5–8.1)

## 2020-04-28 LAB — CBC WITH DIFFERENTIAL/PLATELET
Abs Immature Granulocytes: 0.03 10*3/uL (ref 0.00–0.07)
Basophils Absolute: 0 10*3/uL (ref 0.0–0.1)
Basophils Relative: 1 %
Eosinophils Absolute: 0.3 10*3/uL (ref 0.0–0.5)
Eosinophils Relative: 5 %
HCT: 41.4 % (ref 39.0–52.0)
Hemoglobin: 13.5 g/dL (ref 13.0–17.0)
Immature Granulocytes: 1 %
Lymphocytes Relative: 22 %
Lymphs Abs: 1.1 10*3/uL (ref 0.7–4.0)
MCH: 29.9 pg (ref 26.0–34.0)
MCHC: 32.6 g/dL (ref 30.0–36.0)
MCV: 91.6 fL (ref 80.0–100.0)
Monocytes Absolute: 0.7 10*3/uL (ref 0.1–1.0)
Monocytes Relative: 13 %
Neutro Abs: 3 10*3/uL (ref 1.7–7.7)
Neutrophils Relative %: 58 %
Platelets: 204 10*3/uL (ref 150–400)
RBC: 4.52 MIL/uL (ref 4.22–5.81)
RDW: 13.2 % (ref 11.5–15.5)
WBC: 5.2 10*3/uL (ref 4.0–10.5)
nRBC: 0 % (ref 0.0–0.2)

## 2020-04-28 NOTE — ED Provider Notes (Signed)
[facility] EMERGENCY DEPARTMENT Provider Note   CSN: [id number] Arrival date & time: 04/28/20  1121     History No chief complaint on file.   Cameron [name] is a 74 y.o. male history of diverticulosis, colon polyps removed, hypertension, obesity, presented emergency department with blood in stool.  He reports that he had 3 bowel movements today in a row all of which had bright red blood in his stool.  He describes sensation of needing to use the bathroom recurrently.  He says he has had a very small amount of blood when wiping in the past but never this much blood.  He does report some lightheadedness for the past 2 to 3 days, and also reports he has had a sore throat.  He is not on blood thinners.  He denies any abdominal tenderness, nausea or vomiting.  Denies fevers or chills.  Chart review shows last colonoscopy on 11/30/16, with MD impression as below:  The perianal and digital rectal examinations were normal. - Three sessile polyps were found in the rectum and mid ascending colon. The polyps were 2 to 4 mm in size. These polyps were removed with a cold snare. Resection and retrieval were complete. - The sigmoid colon was moderately redundant. - Multiple medium-mouthed diverticula were found in the left colon. - Internal hemorrhoids were found during retroflexion. The hemorrhoids were medium-sized and Grade I (internal hemorrhoids that do not prolapse). - The exam was otherwise without abnormality on direct and retroflexion views.  HPI     Past Medical History:  Diagnosis Date  . Arthritis    degenerative joint disease, knees   . Constipation due to pain medication   . Diverticulosis of colon   . Fatty liver   . GERD (gastroesophageal reflux disease)    takes Prevacid  . Glaucoma (increased eye pressure)   . High cholesterol   . HTN (hypertension)   . Iron deficiency anemia    h/o  . Keloid of skin   . Lumbar spondylosis    possible right sided  radiculopathy with hx of surgery by Dr. Cyndy Freeze in 2003  . Osteopenia    DEXA 12-2006    . Personal history of colonic polyps 2002   TUBULAR ADENOMA  . Prostatitis, acute 04-2011   admitted to [facility]  . Sleep apnea    on CPAP, last study [facility]  . Tick bite     Patient Active Problem List   Diagnosis Date Noted  . Healthcare maintenance 09/26/2019  . Asthma 01/10/2018  . Lumbar stenosis 08/22/2015  . PCP NOTES >>>>> 07/28/2015  . Primary osteoarthritis of knee 10/08/2014  . Abnormal prostate specific antigen 08/16/2014  . Detrusor muscle hypertonia 08/16/2014  . Right knee DJD 06/11/2014  . Morbid obesity ([place]) 06/11/2014  . Cubital tunnel syndrome 05/19/2014  . Annual physical exam 03/30/2012  . B12 deficiency 03/30/2012  . Osteopenia, low vit D 03/30/2012  . Fatty liver 04/16/2011  . Esophageal reflux 04/16/2011  . OSA on CPAP 03/13/2009  . Essential hypertension 08/02/2007  . DIVERTICULOSIS, COLON 08/02/2007    Past Surgical History:  Procedure Laterality Date  . BACK SURGERY  2003  . CHOLECYSTECTOMY  2003  . COLONOSCOPY    . LAPAROSCOPIC APPENDECTOMY N/A 01/16/2016   Procedure: APPENDECTOMY LAPAROSCOPIC;  Surgeon: Fanny Skates, MD;  Location: [facility];  Service: General;  Laterality: N/A;  . LUMBAR LAMINECTOMY/DECOMPRESSION MICRODISCECTOMY N/A 08/22/2015   Procedure: Lumbar two to lumbar three lumbar three to lumbar four  microdiskectomy;  Surgeon: Ashok Pall, MD;  Location: [place] [facility];  Service: Neurosurgery;  Laterality: N/A;  L23 L34 microdiskectomy  . TOTAL KNEE ARTHROPLASTY Right 06/11/2014   Procedure: TOTAL KNEE ARTHROPLASTY;  Surgeon: Hessie Dibble, MD;  Location: [facility];  Service: Orthopedics;  Laterality: Right;  . TOTAL KNEE ARTHROPLASTY Left 10/08/2014   DR DALLDORF  . TOTAL KNEE ARTHROPLASTY Left 10/08/2014   Procedure: TOTAL LEFT KNEE ARTHROPLASTY;  Surgeon: Hessie Dibble, MD;  Location: [facility];  Service: Orthopedics;  Laterality: Left;       Family  History  Problem Relation Age of Onset  . Dementia Mother   . Diverticulitis Father   . Colon cancer Maternal Uncle        dx age 16s  . Colon cancer Other        grandfather, dx age 47  . Stroke Other        maternal aunt in her 46s  . Breast cancer Maternal Aunt   . Prostate cancer Neg Hx   . Heart attack Neg Hx   . Hypertension Neg Hx   . Diabetes Neg Hx     Social History   Tobacco Use  . Smoking status: Never Smoker  . Smokeless tobacco: Never Used  Vaping Use  . Vaping Use: Never used  Substance Use Topics  . Alcohol use: Yes    Comment: most at 1/month  . Drug use: No    Home Medications Prior to Admission medications   Medication Sig Start Date End Date Taking? Authorizing Provider  albuterol (VENTOLIN HFA) 108 (90 Base) MCG/ACT inhaler Inhale 2 puffs into the lungs every 6 (six) hours as needed for wheezing or shortness of breath. Patient not taking: Reported on 03/10/2020 10/07/19   Evelina Dun A, FNP  Ascorbic Acid (VITAMIN C) 1000 MG tablet Take 1,000 mg by mouth 2 (two) times daily.     [provider]  aspirin 81 MG tablet Take 81 mg by mouth daily.    [provider]  atorvastatin (LIPITOR) 10 MG tablet Take 1 tablet (10 mg total) by mouth at bedtime. 04/22/20   Colon Branch, MD  brimonidine (ALPHAGAN) 0.2 % ophthalmic solution 3 drops daily.  06/05/18   [provider]  folic acid (FOLVITE) 1 MG tablet Take 1 tablet (1 mg total) by mouth daily. 09/05/19   Colon Branch, MD  latanoprost (XALATAN) 0.005 % ophthalmic solution Place 1 drop into both eyes at bedtime.  05/13/14   [provider]  metoprolol succinate (TOPROL-XL) 25 MG 24 hr tablet TAKE ONE-HALF TABLET BY  MOUTH DAILY 01/14/20   Colon Branch, MD  mometasone-formoterol [facility]) 200-5 MCG/ACT AERO Inhale 2 puffs into the lungs 2 (two) times daily. Patient not taking: Reported on 10/16/2019 07/18/18   Colon Branch, MD  Multiple Vitamin (MULTIVITAMIN WITH MINERALS) TABS tablet  Take 1 tablet by mouth daily.    [provider]  MYRBETRIQ 50 MG TB24 tablet Take 50 mg by mouth daily.  04/15/14   [provider]  solifenacin (VESICARE) 5 MG tablet Take 5 mg by mouth daily.    [provider]    Allergies    Chlorhexidine gluconate, Ciprofloxacin hcl, and Clindamycin  Review of Systems   Review of Systems  Constitutional: Negative for chills and fever.  HENT: Positive for sore throat.   Eyes: Negative for pain and visual disturbance.  Respiratory: Negative for cough and shortness of breath.   Cardiovascular:  Negative for chest pain and palpitations.  Gastrointestinal: Positive for blood in stool. Negative for abdominal pain, constipation, nausea, rectal pain and vomiting.  Genitourinary: Negative for dysuria and hematuria.  Musculoskeletal: Negative for arthralgias and back pain.  Skin: Negative for color change and rash.  Neurological: Positive for light-headedness. Negative for seizures and syncope.  Psychiatric/Behavioral: Negative for agitation and confusion.  All other systems reviewed and are negative.   Physical Exam Updated Vital Signs BP 118/78   Pulse 71   Temp 98.9 F (37.2 C) (Oral)   Resp 19   Ht 5\' 11"  (1.803 m)   Wt (!) 142.4 kg   SpO2 100%   BMI 43.79 kg/m   Physical Exam Vitals and nursing note reviewed.  Constitutional:      Appearance: He is well-developed. He is obese.  HENT:     Head: Normocephalic and atraumatic.  Eyes:     Conjunctiva/sclera: Conjunctivae normal.     Pupils: Pupils are equal, round, and reactive to light.  Cardiovascular:     Rate and Rhythm: Normal rate and regular rhythm.     Pulses: Normal pulses.  Pulmonary:     Effort: Pulmonary effort is normal. No respiratory distress.     Breath sounds: Normal breath sounds.  Abdominal:     General: There is no distension.     Palpations: Abdomen is soft.     Tenderness: There is no abdominal tenderness.  Musculoskeletal:      Cervical back: Neck supple.  Skin:    General: Skin is warm and dry.  Neurological:     General: No focal deficit present.     Mental Status: He is alert and oriented to person, place, and time.  Psychiatric:        Mood and Affect: Mood normal.        Behavior: Behavior normal.     ED Results / Procedures / Treatments   Labs (all labs ordered are listed, but only abnormal results are displayed) Labs Reviewed  COMPREHENSIVE METABOLIC PANEL - Abnormal; Notable for the following components:      Result Value   Glucose, Bld 110 (*)    Calcium 8.8 (*)    All other components within normal limits  CBC WITH DIFFERENTIAL/PLATELET    EKG None  Radiology No results found.  Procedures Procedures (including critical care time)  Medications Ordered in ED Medications - No data to display  ED Course  I have reviewed the triage vital signs and the nursing notes.  Pertinent labs & imaging results that were available during my care of the patient were reviewed by me and considered in my medical decision making (see chart for details).  74 yo male presenting to ED with painless rectal bleeding x 1 day, 3 BM this morning with feeling of tenesmus during that.  No active bleed now.  He had felt lightheaded today and on previous days according to his wife, and also described viral type syndrome with sore throat (that she had earlier) before this BM episode today.  He is well appearing on exam.  Benign abdominal exam.  Highly doubtful of diverticulitis or bowel obstruction.  Doubt AAA.  With his history I suspect this is diverticulosis bleeding.  We checked labs here which showed a completely normal hgb.  He was not tachycardic and did not appear anemic to me.  I felt he was reasonably safe for discharge home with GI follow up.  Clinical Course as of Apr 28 1699  Mon Apr 28, 2020  1411 Patient has no new complaints.  Continues to have a benign abdominal exam.  Labs reviewed personally and  showed normal hemoglobin.  I suspect he is having a bleed from his diverticulosis.  I advised him that this will likely continue for a day or 2 but should gradually resolve.  If his bleeding worsens or he becomes more lightheaded or short of breath, or his wife begins to think he is pale, he can come back to the ER.  Otherwise I advised that he call his GI doctor to follow-up in the office, as he is getting close to his 5 year colonoscopy date anyway   [MT]    Clinical Course User Index [MT] Nonnie Pickney, Carola Rhine, MD    Final Clinical Impression(s) / ED Diagnoses Final diagnoses:  Diverticulosis  Blood in stool    Rx / DC Orders ED Discharge Orders    None       Wyvonnia Dusky, MD 04/28/20 1700

## 2020-04-28 NOTE — ED Triage Notes (Signed)
Pt c/o soft bloody stools x 3 this am with no hx of same-feeling lightheaded-denies pain-NAD-steady gait

## 2020-04-28 NOTE — Discharge Instructions (Signed)
Please call the office for Cameron [name], Dr Wilfrid Lund did your colonoscopy in 2018.  Ask for a follow up appointment for your bloody stools.  You are close to due for your next 5 year colonoscopy screening.  Please read over the information I've included about diverticulosis and [name] bleeding.  Pay particular attention to the signs and symptoms to return to the ER.

## 2020-04-29 ENCOUNTER — Encounter: Payer: Self-pay | Admitting: Gastroenterology

## 2020-04-29 ENCOUNTER — Ambulatory Visit: Payer: Medicare Other | Admitting: Gastroenterology

## 2020-04-29 VITALS — BP 130/84 | HR 93 | Ht 71.0 in | Wt 319.0 lb

## 2020-04-29 DIAGNOSIS — K5731 Diverticulosis of large intestine without perforation or abscess with bleeding: Secondary | ICD-10-CM | POA: Diagnosis not present

## 2020-04-29 NOTE — Progress Notes (Signed)
[facility] Consult Note:  History: Cameron [name] 04/29/2020  Referring provider: Colon Branch, MD  Reason for consult/chief complaint: Blood In Stools (One time with BM had blood in stool )   Subjective  HPI: Surveillance colonoscopy Jan 2018: 2 TA and one [facility], redundant colon, diverticulosis, internal HR  Cameron [name] was in the emergency department yesterday because in the early morning he awoke with urgent need for BM, passed some stool but also bright red blood without clots.  He had 2 more episodes of that and then went to the ED.  No more bleeding since then, including a BM this morning without blood.  He denies abdominal pain throughout this episode, and had not previously had anything like this occur.  He denies chest pain dyspnea or lightheadedness. ED provider note was reviewed, it was suspected he may have had self-limited diverticular bleeding.  Hemoglobin was normal and he was discharged home. He reports regular soft bowel movements lately without the need to strain. Cameron [name] has not been taking aspirin or NSAIDs lately or had any other recent changes to his medical history.  ROS:  Review of Systems  Constitutional: Negative for appetite change and unexpected weight change.  HENT: Negative for mouth sores and voice change.   Eyes: Negative for pain and redness.  Respiratory: Negative for cough and shortness of breath.   Cardiovascular: Negative for chest pain and palpitations.  Genitourinary: Negative for dysuria and hematuria.  Musculoskeletal: Negative for arthralgias and myalgias.  Skin: Negative for pallor and rash.  Neurological: Negative for weakness and headaches.  Hematological: Negative for adenopathy.     Past Medical History: Past Medical History:  Diagnosis Date  . Arthritis    degenerative joint disease, knees   . Constipation due to pain medication   . Diverticulosis of colon   . Fatty liver   . GERD (gastroesophageal reflux  disease)    takes Prevacid  . Glaucoma (increased eye pressure)   . High cholesterol   . HTN (hypertension)   . Iron deficiency anemia    h/o  . Keloid of skin   . Lumbar spondylosis    possible right sided radiculopathy with hx of surgery by Dr. Cyndy Freeze in 2003  . Osteopenia    DEXA 12-2006    . Personal history of colonic polyps 2002   TUBULAR ADENOMA  . Prostatitis, acute 04-2011   admitted to [facility]  . Sleep apnea    on CPAP, last study [facility]  . Tick bite      Past Surgical History: Past Surgical History:  Procedure Laterality Date  . BACK SURGERY  2003  . CHOLECYSTECTOMY  2003  . COLONOSCOPY    . LAPAROSCOPIC APPENDECTOMY N/A 01/16/2016   Procedure: APPENDECTOMY LAPAROSCOPIC;  Surgeon: Fanny Skates, MD;  Location: [place];  Service: General;  Laterality: N/A;  . LUMBAR LAMINECTOMY/DECOMPRESSION MICRODISCECTOMY N/A 08/22/2015   Procedure: Lumbar two to lumbar three lumbar three to lumbar four microdiskectomy;  Surgeon: Ashok Pall, MD;  Location: [place] [facility];  Service: Neurosurgery;  Laterality: N/A;  L23 L34 microdiskectomy  . TOTAL KNEE ARTHROPLASTY Right 06/11/2014   Procedure: TOTAL KNEE ARTHROPLASTY;  Surgeon: Hessie Dibble, MD;  Location: [place];  Service: Orthopedics;  Laterality: Right;  . TOTAL KNEE ARTHROPLASTY Left 10/08/2014   DR DALLDORF  . TOTAL KNEE ARTHROPLASTY Left 10/08/2014   Procedure: TOTAL LEFT KNEE ARTHROPLASTY;  Surgeon: Hessie Dibble, MD;  Location: [place];  Service: Orthopedics;  Laterality: Left;  Family History: Family History  Problem Relation Age of Onset  . Dementia Mother   . Diverticulitis Father   . Colon cancer Maternal Uncle        dx age 37s  . Colon cancer Other        grandfather, dx age 28  . Stroke Other        maternal aunt in her 56s  . Breast cancer Maternal Aunt   . Prostate cancer Neg Hx   . Heart attack Neg Hx   . Hypertension Neg Hx   . Diabetes Neg Hx     Social History: Social History   Socioeconomic  History  . Marital status: Married    Spouse name: Not on file  . Number of children: 2  . Years of education: Not on file  . Highest education level: Not on file  Occupational History  . Occupation: retired--works in [place] Use  . Smoking status: Never Smoker  . Smokeless tobacco: Never Used  Vaping Use  . Vaping Use: Never used  Substance and Sexual Activity  . Alcohol use: Yes    Comment: most at 1/month  . Drug use: No  . Sexual activity: Not on file  Other Topics Concern  . Not on file  Social History Narrative   Household- pt and wife   plays tennis sometimes    Social Determinants of Radio broadcast assistant Strain:   . Difficulty of Paying Living Expenses:   Food Insecurity:   . Worried About Charity fundraiser in the Last Year:   . Arboriculturist in the Last Year:   Transportation Needs:   . Film/video editor (Medical):   Marland Kitchen Lack of Transportation (Non-Medical):   Physical Activity:   . Days of Exercise per Week:   . Minutes of Exercise per Session:   Stress:   . Feeling of Stress :   Social Connections:   . Frequency of Communication with Friends and Family:   . Frequency of Social Gatherings with Friends and Family:   . Attends Religious Services:   . Active Member of Clubs or Organizations:   . Attends Archivist Meetings:   Marland Kitchen Marital Status:     Allergies: Allergies  Allergen Reactions  . Chlorhexidine Gluconate Hives and Other (See Comments)    Red rash developed on upper chest   . Ciprofloxacin Hcl Itching  . Clindamycin Itching and Rash    Outpatient Meds: Current Outpatient Medications  Medication Sig Dispense Refill  . albuterol (VENTOLIN HFA) 108 (90 Base) MCG/ACT inhaler Inhale 2 puffs into the lungs every 6 (six) hours as needed for wheezing or shortness of breath. 8 g 0  . Ascorbic Acid (VITAMIN C) 1000 MG tablet Take 1,000 mg by mouth 2 (two) times daily.     Marland Kitchen aspirin 81 MG tablet Take 81 mg by mouth daily.     Marland Kitchen atorvastatin (LIPITOR) 10 MG tablet Take 1 tablet (10 mg total) by mouth at bedtime. 90 tablet 3  . brimonidine (ALPHAGAN) 0.2 % ophthalmic solution 3 drops daily.     . folic acid (FOLVITE) 1 MG tablet Take 1 tablet (1 mg total) by mouth daily. 90 tablet 3  . latanoprost (XALATAN) 0.005 % ophthalmic solution Place 1 drop into both eyes at bedtime.     . metoprolol succinate (TOPROL-XL) 25 MG 24 hr tablet TAKE ONE-HALF TABLET BY  MOUTH DAILY 45 tablet 3  . mometasone-formoterol (DULERA) 200-5  MCG/ACT AERO Inhale 2 puffs into the lungs 2 (two) times daily. 13 g 5  . Multiple Vitamin (MULTIVITAMIN WITH MINERALS) TABS tablet Take 1 tablet by mouth daily.    Marland Kitchen MYRBETRIQ 50 MG TB24 tablet Take 50 mg by mouth daily.     . solifenacin (VESICARE) 5 MG tablet Take 5 mg by mouth daily.     No current facility-administered medications for this visit.      ___________________________________________________________________ Objective   Exam:  BP 130/84 (BP Location: Left Wrist, Patient Position: Sitting)   Pulse 93   Ht 5\' 11"  (1.803 m)   Wt (!) 319 lb (144.7 kg)   SpO2 95%   BMI 44.49 kg/m    General: Well-appearing  Eyes: sclera anicteric, no redness  ENT: oral mucosa moist without lesions, no cervical or supraclavicular lymphadenopathy  CV: RRR without murmur, S1/S2, no JVD, no peripheral edema  Resp: clear to auscultation bilaterally, normal RR and effort noted  GI: soft, no tenderness, with active bowel sounds. No guarding or palpable organomegaly noted, limited by body habitus.  Skin; warm and dry, no rash or jaundice noted  Neuro: awake, alert and oriented x 3. Normal gross motor function and fluent speech Rectal: Normal external, normal sphincter tone, no palpable internal lesions or tenderness.  Old dark red blood on the glove Labs:  CBC Latest Ref Rng & Units 04/28/2020 09/25/2019 07/03/2018  WBC 4.0 - 10.5 K/uL 5.2 3.9(L) 4.7  Hemoglobin 13.0 - 17.0 g/dL 13.5 13.7  13.8  Hematocrit 39 - 52 % 41.4 40.9 41.1  Platelets 150 - 400 K/uL 204 191.0 185.0     Assessment: Encounter Diagnosis  Name Primary?  . Diverticulosis of colon with hemorrhage Yes    This appears to have been self-limited diverticular bleeding, first episode.  Hemoglobin normal at ED yesterday, he has not passed any blood spontaneously since yesterday either, so I think this is old blood found on exam today.  Plan:  I have told him that he will likely see this old blood through tomorrow. If he still sees blood the following day, he will let us know we will send him to the lab for CBC.   Or certainly if he passes bright red blood before then, he will contact me immediately and would most likely need repeat evaluation in the emergency department.  He thought perhaps he had triggered this by having some strawberries lately, since he understood that seeds and other foods might precipitate diverticular issues.  I have reassured him this was not the case.  Thank you for the courtesy of this consult.  Please call me with any questions or concerns.  Nelida Meuse III  CC: Referring provider noted above

## 2020-04-29 NOTE — Patient Instructions (Signed)
If you are age 72 or older, your body mass index should be between 23-30. Your Body mass index is 44.49 kg/m. If this is out of the aforementioned range listed, please consider follow up with your Primary Care Provider.  If you are age 68 or younger, your body mass index should be between 19-25. Your Body mass index is 44.49 kg/m. If this is out of the aformentioned range listed, please consider follow up with your Primary Care Provider.   It was a pleasure to see you today!  Dr. Loletha Carrow

## 2020-04-30 ENCOUNTER — Telehealth (INDEPENDENT_AMBULATORY_CARE_PROVIDER_SITE_OTHER): Payer: Medicare Other | Admitting: Internal Medicine

## 2020-04-30 ENCOUNTER — Other Ambulatory Visit: Payer: Self-pay

## 2020-04-30 ENCOUNTER — Encounter: Payer: Self-pay | Admitting: Internal Medicine

## 2020-04-30 VITALS — BP 125/79 | HR 85 | Ht 71.0 in | Wt 319.0 lb

## 2020-04-30 DIAGNOSIS — J069 Acute upper respiratory infection, unspecified: Secondary | ICD-10-CM

## 2020-04-30 MED ORDER — AZITHROMYCIN 250 MG PO TABS: ORAL_TABLET | ORAL | 0 refills | Status: DC

## 2020-04-30 NOTE — Progress Notes (Signed)
Pre visit review using our clinic review tool, if applicable. No additional management support is needed unless otherwise documented below in the visit note. 

## 2020-04-30 NOTE — Progress Notes (Signed)
Subjective:    Patient ID: Cameron [name], male    DOB: 1946-06-27, 74 y.o.   MRN: [id number]  DOS:  04/30/2020 Type of visit - description: Virtual Visit via Telephone  Attempted  to make this a video visit, due to technical difficulties from the patient side it was not possible  thus we proceeded with a Virtual Visit via Telephone    I connected with above mentioned patient  by telephone and verified that I am speaking with the correct person using two identifiers.  THIS ENCOUNTER IS A VIRTUAL VISIT DUE TO COVID-19 - PATIENT WAS NOT SEEN IN THE OFFICE. PATIENT HAS CONSENTED TO VIRTUAL VISIT / TELEMEDICINE VISIT   Location of patient: home  Location of provider: office  I discussed the limitations, risks, security and privacy concerns of performing an evaluation and management service by telephone and the availability of in person appointments. I also discussed with the patient that there may be a patient responsible charge related to this service. The patient expressed understanding and agreed to proceed.  Acute Symptoms started 4 days ago: Sore throat, sinus congestion, cough. He noted cloudy nasal discharge and sputum.  Wife had the same symptoms, went to urgent care few days ago, tested negative for Covid.  She was diagnosed with bronchitis.  The patient denies fever chills No chest pain no difficulty breathing Has a history of asthma, currently with no wheezing but /c has cough he tried albuterol x1 and did not help much. No myalgias, nausea, vomiting or headaches.    Review of Systems See above   Past Medical History:  Diagnosis Date  . Arthritis    degenerative joint disease, knees   . Constipation due to pain medication   . Diverticulosis of colon   . Fatty liver   . GERD (gastroesophageal reflux disease)    takes Prevacid  . Glaucoma (increased eye pressure)   . High cholesterol   . HTN (hypertension)   . Iron deficiency anemia    h/o  . Keloid of skin    . Lumbar spondylosis    possible right sided radiculopathy with hx of surgery by Dr. Cyndy Freeze in 2003  . Osteopenia    DEXA 12-2006    . Personal history of colonic polyps 2002   TUBULAR ADENOMA  . Prostatitis, acute 04-2011   admitted to [facility]  . Sleep apnea    on CPAP, last study [facility]  . Tick bite     Past Surgical History:  Procedure Laterality Date  . BACK SURGERY  2003  . CHOLECYSTECTOMY  2003  . COLONOSCOPY    . LAPAROSCOPIC APPENDECTOMY N/A 01/16/2016   Procedure: APPENDECTOMY LAPAROSCOPIC;  Surgeon: Fanny Skates, MD;  Location: [place];  Service: General;  Laterality: N/A;  . LUMBAR LAMINECTOMY/DECOMPRESSION MICRODISCECTOMY N/A 08/22/2015   Procedure: Lumbar two to lumbar three lumbar three to lumbar four microdiskectomy;  Surgeon: Ashok Pall, MD;  Location: [place] [facility];  Service: Neurosurgery;  Laterality: N/A;  L23 L34 microdiskectomy  . TOTAL KNEE ARTHROPLASTY Right 06/11/2014   Procedure: TOTAL KNEE ARTHROPLASTY;  Surgeon: Hessie Dibble, MD;  Location: [place];  Service: Orthopedics;  Laterality: Right;  . TOTAL KNEE ARTHROPLASTY Left 10/08/2014   DR DALLDORF  . TOTAL KNEE ARTHROPLASTY Left 10/08/2014   Procedure: TOTAL LEFT KNEE ARTHROPLASTY;  Surgeon: Hessie Dibble, MD;  Location: [place];  Service: Orthopedics;  Laterality: Left;    Allergies as of 04/30/2020      Reactions  Chlorhexidine Gluconate Hives, Other (See Comments)   Red rash developed on upper chest   Ciprofloxacin Hcl Itching   Clindamycin Itching, Rash      Medication List       Accurate as of April 30, 2020  2:18 PM. If you have any questions, ask your nurse or doctor.        albuterol 108 (90 Base) MCG/ACT inhaler Commonly known as: VENTOLIN HFA Inhale 2 puffs into the lungs every 6 (six) hours as needed for wheezing or shortness of breath.   aspirin 81 MG tablet Take 81 mg by mouth daily.   atorvastatin 10 MG tablet Commonly known as: LIPITOR Take 1 tablet (10 mg total) by mouth at  bedtime.   azithromycin 250 MG tablet Commonly known as: Zithromax Z-Pak 2 tabs a day the first day, then 1 tab a day x 4 days Started by: Kathlene November, MD   brimonidine 0.2 % ophthalmic solution Commonly known as: ALPHAGAN 3 drops daily.   folic acid 1 MG tablet Commonly known as: FOLVITE Take 1 tablet (1 mg total) by mouth daily.   latanoprost 0.005 % ophthalmic solution Commonly known as: XALATAN Place 1 drop into both eyes at bedtime.   metoprolol succinate 25 MG 24 hr tablet Commonly known as: TOPROL-XL TAKE ONE-HALF TABLET BY  MOUTH DAILY   mometasone-formoterol 200-5 MCG/ACT Aero Commonly known as: Dulera Inhale 2 puffs into the lungs 2 (two) times daily.   multivitamin with minerals Tabs tablet Take 1 tablet by mouth daily.   Myrbetriq 50 MG Tb24 tablet Generic drug: mirabegron ER Take 50 mg by mouth daily.   solifenacin 5 MG tablet Commonly known as: VESICARE Take 5 mg by mouth daily.   vitamin C 1000 MG tablet Take 1,000 mg by mouth 2 (two) times daily.          Objective:   Physical Exam Ht 5\' 11"  (1.803 m)   Wt (!) 319 lb (144.7 kg)   BMI 44.49 kg/m  This was a virtual telephone evaluation, he is alert oriented x3, speaking in complete sentences, voice is slightly nasal but no cough or chest congestion noted.    Assessment      Assessment Hypertension OSA on CPAP GI: GERD, Fatty liver Asthma, previously RAD (occ wheezing w/ URIs, inhalers) DJD: lumbar spondylosis, back surgery 2003, knee replacements Morbid obesity  GU: --LUTS  Dr Venia Minks ([facility]) --h/o prostatitis admitted 2012 [facility] Glaucoma Osteopenia per DEXA 2008, multiple attempts to redo bone density test failed.  h/o iron deficiency  H/o  B12 and vit D def, 2016 levels wnl  PLAN:  URI:  presents w/ above sx, had covid vaccines x 2 , no fever chills chest pain or other symptoms, likely a URI.  Less likely Covid or influenza. We agreed on: Rest, fluids, Tylenol, Mucinex DM, OTC  Flonase. If not improving he will start a Z-Pak and let me know. GI bleed: Also, chart is reviewed, went to the ER 04/28/2020, DX with diverticular bleed, saw GI in the next day, recommend to keep them posted if symptoms do not resolve they will do further evaluation.      I discussed the assessment and treatment plan with the patient. The patient was provided an opportunity to ask questions and all were answered. The patient agreed with the plan and demonstrated an understanding of the instructions.   The patient was advised to call back or seek an in-person evaluation if the symptoms worsen or if the condition  fails to improve as anticipated.  I provided 18 minutes of non-face-to-face time during this encounter.  Kathlene November, MD

## 2020-05-02 NOTE — Assessment & Plan Note (Signed)
URI:  presents w/ above sx, had covid vaccines x 2 , no fever chills chest pain or other symptoms, likely a URI.  Less likely Covid or influenza. We agreed on: Rest, fluids, Tylenol, Mucinex DM, OTC Flonase. If not improving he will start a Z-Pak and let me know. GI bleed: Also, chart is reviewed, went to the ER 04/28/2020, DX with diverticular bleed, saw GI in the next day, recommend to keep them posted if symptoms do not resolve they will do further evaluation.

## 2020-05-21 ENCOUNTER — Encounter: Payer: Self-pay | Admitting: Internal Medicine

## 2020-06-09 DIAGNOSIS — H401131 Primary open-angle glaucoma, bilateral, mild stage: Secondary | ICD-10-CM | POA: Diagnosis not present

## 2020-07-15 ENCOUNTER — Encounter: Payer: Self-pay | Admitting: Gastroenterology

## 2020-07-18 DIAGNOSIS — G4733 Obstructive sleep apnea (adult) (pediatric): Secondary | ICD-10-CM | POA: Diagnosis not present

## 2020-08-27 ENCOUNTER — Telehealth: Payer: Self-pay | Admitting: *Deleted

## 2020-08-27 NOTE — Telephone Encounter (Signed)
Patient no showed PV today- Called patient and left message to return call by 5 pm today- If no call by 5 pm, PV and procedure will be canceled - no call at 5 pm -  PV and Procedure both canceled- No Show letter mailed to patient  

## 2020-09-04 DIAGNOSIS — N138 Other obstructive and reflux uropathy: Secondary | ICD-10-CM | POA: Diagnosis not present

## 2020-09-09 ENCOUNTER — Encounter: Payer: Medicare Other | Admitting: Gastroenterology

## 2020-09-12 DIAGNOSIS — N138 Other obstructive and reflux uropathy: Secondary | ICD-10-CM | POA: Diagnosis not present

## 2020-09-12 DIAGNOSIS — N3281 Overactive bladder: Secondary | ICD-10-CM | POA: Diagnosis not present

## 2020-09-15 DIAGNOSIS — Z20822 Contact with and (suspected) exposure to covid-19: Secondary | ICD-10-CM | POA: Diagnosis not present

## 2020-09-26 ENCOUNTER — Ambulatory Visit (INDEPENDENT_AMBULATORY_CARE_PROVIDER_SITE_OTHER): Payer: Medicare Other | Admitting: Internal Medicine

## 2020-09-26 ENCOUNTER — Ambulatory Visit: Payer: Medicare Other | Admitting: Pulmonary Disease

## 2020-09-26 ENCOUNTER — Encounter: Payer: Self-pay | Admitting: Internal Medicine

## 2020-09-26 ENCOUNTER — Other Ambulatory Visit: Payer: Self-pay

## 2020-09-26 VITALS — BP 133/81 | HR 66 | Temp 97.9°F | Resp 18 | Ht 71.0 in | Wt 320.5 lb

## 2020-09-26 DIAGNOSIS — I10 Essential (primary) hypertension: Secondary | ICD-10-CM

## 2020-09-26 DIAGNOSIS — R739 Hyperglycemia, unspecified: Secondary | ICD-10-CM | POA: Diagnosis not present

## 2020-09-26 DIAGNOSIS — E538 Deficiency of other specified B group vitamins: Secondary | ICD-10-CM

## 2020-09-26 DIAGNOSIS — Z0000 Encounter for general adult medical examination without abnormal findings: Secondary | ICD-10-CM | POA: Diagnosis not present

## 2020-09-26 DIAGNOSIS — E785 Hyperlipidemia, unspecified: Secondary | ICD-10-CM

## 2020-09-26 DIAGNOSIS — M8580 Other specified disorders of bone density and structure, unspecified site: Secondary | ICD-10-CM

## 2020-09-26 LAB — BASIC METABOLIC PANEL
BUN: 16 mg/dL (ref 6–23)
CO2: 32 mEq/L (ref 19–32)
Calcium: 9.1 mg/dL (ref 8.4–10.5)
Chloride: 103 mEq/L (ref 96–112)
Creatinine, Ser: 1 mg/dL (ref 0.40–1.50)
GFR: 75.47 mL/min (ref >60.00)
Glucose, Bld: 91 mg/dL (ref 70–99)
Potassium: 4.3 mEq/L (ref 3.5–5.1)
Sodium: 140 mEq/L (ref 135–145)

## 2020-09-26 LAB — CBC WITH DIFFERENTIAL/PLATELET
Basophils Absolute: 0 10*3/uL (ref 0.0–0.1)
Basophils Relative: 0.4 % (ref 0.0–3.0)
Eosinophils Absolute: 0.3 10*3/uL (ref 0.0–0.7)
Eosinophils Relative: 7.8 % — ABNORMAL HIGH (ref 0.0–5.0)
HCT: 40.2 % (ref 39.0–52.0)
Hemoglobin: 13.3 g/dL (ref 13.0–17.0)
Lymphocytes Relative: 30.3 % (ref 12.0–46.0)
Lymphs Abs: 1.3 10*3/uL (ref 0.7–4.0)
MCHC: 33.1 g/dL (ref 30.0–36.0)
MCV: 88.3 fl (ref 78.0–100.0)
Monocytes Absolute: 0.6 10*3/uL (ref 0.1–1.0)
Monocytes Relative: 14.6 % — ABNORMAL HIGH (ref 3.0–12.0)
Neutro Abs: 1.9 10*3/uL (ref 1.4–7.7)
Neutrophils Relative %: 46.9 % (ref 43.0–77.0)
Platelets: 208 10*3/uL (ref 150.0–400.0)
RBC: 4.55 Mil/uL (ref 4.22–5.81)
RDW: 13.8 % (ref 11.5–15.5)
WBC: 4.1 10*3/uL (ref 4.0–10.5)

## 2020-09-26 LAB — B12 AND FOLATE PANEL
Folate: 14.7 ng/mL (ref >5.9)
Vitamin B-12: 574 pg/mL (ref 211–911)

## 2020-09-26 LAB — HEMOGLOBIN A1C: Hgb A1c MFr Bld: 5.9 % (ref 4.6–6.5)

## 2020-09-26 LAB — TSH: TSH: 1.39 u[IU]/mL (ref 0.35–4.50)

## 2020-09-26 MED ORDER — FOLIC ACID 1 MG PO TABS: 1.0000 mg | ORAL_TABLET | Freq: Every day | ORAL | 3 refills | Status: DC

## 2020-09-26 NOTE — Progress Notes (Signed)
Subjective:    Patient ID: Cameron [name], male    DOB: [birth year], 74 y.o.   MRN: [id number]  DOS:  09/26/2020 Type of visit - description: CPX  Since the last office visit he is doing well.  Review of Systems Other than occasional LUTS no other sxs.  Other than above, a 14 point review of systems is negative      Past Medical History:  Diagnosis Date  . Arthritis    degenerative joint disease, knees   . Constipation due to pain medication   . Diverticulosis of colon   . Fatty liver   . GERD (gastroesophageal reflux disease)    takes Prevacid  . Glaucoma (increased eye pressure)   . High cholesterol   . HTN (hypertension)   . Iron deficiency anemia    h/o  . Keloid of skin   . Lumbar spondylosis    possible right sided radiculopathy with hx of surgery by Dr. Cyndy Freeze in 2003  . Osteopenia    DEXA 12-2006    . Personal history of colonic polyps 2002   TUBULAR ADENOMA  . Prostatitis, acute 04-2011   admitted to [facility]  . Sleep apnea    on CPAP, last study [facility]  . Tick bite     Past Surgical History:  Procedure Laterality Date  . BACK SURGERY  2003  . CHOLECYSTECTOMY  2003  . COLONOSCOPY    . LAPAROSCOPIC APPENDECTOMY N/A 01/16/2016   Procedure: APPENDECTOMY LAPAROSCOPIC;  Surgeon: Fanny Skates, MD;  Location: [place];  Service: General;  Laterality: N/A;  . LUMBAR LAMINECTOMY/DECOMPRESSION MICRODISCECTOMY N/A 08/22/2015   Procedure: Lumbar two to lumbar three lumbar three to lumbar four microdiskectomy;  Surgeon: Ashok Pall, MD;  Location: [place] [facility];  Service: Neurosurgery;  Laterality: N/A;  L23 L34 microdiskectomy  . TOTAL KNEE ARTHROPLASTY Right 06/11/2014   Procedure: TOTAL KNEE ARTHROPLASTY;  Surgeon: Hessie Dibble, MD;  Location: [place];  Service: Orthopedics;  Laterality: Right;  . TOTAL KNEE ARTHROPLASTY Left 10/08/2014   DR DALLDORF  . TOTAL KNEE ARTHROPLASTY Left 10/08/2014   Procedure: TOTAL LEFT KNEE ARTHROPLASTY;  Surgeon: Hessie Dibble,  MD;  Location: [place];  Service: Orthopedics;  Laterality: Left;    Allergies as of 09/26/2020      Reactions   Chlorhexidine Gluconate Hives, Other (See Comments)   Red rash developed on upper chest   Ciprofloxacin Hcl Itching   Clindamycin Itching, Rash      Medication List       Accurate as of September 26, 2020 11:59 PM. If you have any questions, ask your nurse or doctor.        STOP taking these medications   aspirin 81 MG tablet Stopped by: Kathlene November, MD   azithromycin 250 MG tablet Commonly known as: Zithromax Z-Pak Stopped by: Kathlene November, MD     TAKE these medications   albuterol 108 (90 Base) MCG/ACT inhaler Commonly known as: VENTOLIN HFA Inhale 2 puffs into the lungs every 6 (six) hours as needed for wheezing or shortness of breath.   atorvastatin 10 MG tablet Commonly known as: LIPITOR Take 1 tablet (10 mg total) by mouth at bedtime.   brimonidine 0.2 % ophthalmic solution Commonly known as: ALPHAGAN 3 drops daily.   folic acid 1 MG tablet Commonly known as: FOLVITE Take 1 tablet (1 mg total) by mouth daily.   latanoprost 0.005 % ophthalmic solution Commonly known as: XALATAN Place 1 drop into both eyes  at bedtime.   metoprolol succinate 25 MG 24 hr tablet Commonly known as: TOPROL-XL TAKE ONE-HALF TABLET BY  MOUTH DAILY   mometasone-formoterol 200-5 MCG/ACT Aero Commonly known as: Dulera Inhale 2 puffs into the lungs 2 (two) times daily.   multivitamin with minerals Tabs tablet Take 1 tablet by mouth daily.   Myrbetriq 50 MG Tb24 tablet Generic drug: mirabegron ER Take 50 mg by mouth daily.   solifenacin 10 MG tablet Commonly known as: VESICARE Take 10 mg by mouth daily. What changed: Another medication with the same name was removed. Continue taking this medication, and follow the directions you see here. Changed by: Kathlene November, MD   vitamin C 1000 MG tablet Take 1,000 mg by mouth 2 (two) times daily.          Objective:   Physical  Exam BP 133/81 (BP Location: Left Arm, Patient Position: Sitting, Cuff Size: Normal)   Pulse 66   Temp 97.9 F (36.6 C) (Oral)   Resp 18   Ht 5\' 11"  (1.803 m)   Wt (!) 320 lb 8 oz (145.4 kg)   SpO2 95%   BMI 44.70 kg/m  General: Well developed, NAD, BMI noted Neck: No  thyromegaly  HEENT:  Normocephalic . Face symmetric, atraumatic Lungs:  CTA B Normal respiratory effort, no intercostal retractions, no accessory muscle use. Heart: RRR,  no murmur.  Abdomen:  Not distended, soft, non-tender. No rebound or rigidity.   Lower extremities: no pretibial edema bilaterally  Skin: Exposed areas without rash. Not pale. Not jaundice Neurologic:  alert & oriented X3.  Speech normal, gait appropriate for age and unassisted Strength symmetric and appropriate for age.  Psych: Cognition and judgment appear intact.  Cooperative with normal attention span and concentration.  Behavior appropriate. No anxious or depressed appearing.     Assessment     Assessment Hypertension OSA on CPAP GI: GERD, Fatty liver Asthma, previously RAD (occ wheezing w/ URIs, inhalers) DJD: lumbar spondylosis, back surgery 2003, knee replacements Morbid obesity  GU: --LUTS  Dr Venia Minks ([facility]) --h/o prostatitis admitted 2012 [facility] Glaucoma Osteopenia per DEXA 2008, multiple attempts to redo bone density test failed.  h/o iron deficiency  H/o  B12 and vit D def, 2016 levels wnl  PLAN:  Here for CPX HTN: Well-controlled today, reports good ambulatory BPs, continue Metoprolol. Asthma: Essentially asymptomatic on meds as needed. DJD: Aches and pains at baseline Osteopenia, remote h/o: check a DEXA B12 and vitamin D deficiency: On OTCs vitamin D, D98 and folic acid.  Checking labs. Morbid obesity: Counseled about diet and exercise. RTC 1 year.      This visit occurred during the SARS-CoV-2 public health emergency.  Safety protocols were in place, including screening questions prior to the visit, additional  usage of staff PPE, and extensive cleaning of exam room while observing appropriate contact time as indicated for disinfecting solutions.

## 2020-09-26 NOTE — Patient Instructions (Addendum)
Happy early Rudene Anda and Happy Holidays!  Check the  blood pressure bilaterally BP GOAL is between 110/65 and  135/85. If it is consistently higher or lower, let me know  GO TO THE LAB : Get the blood work     [place], [place] back for a physical exam in 1 year.  Call sooner if needed.

## 2020-09-26 NOTE — Progress Notes (Signed)
Pre visit review using our clinic review tool, if applicable. No additional management support is needed unless otherwise documented below in the visit note. 

## 2020-09-27 ENCOUNTER — Encounter: Payer: Self-pay | Admitting: Internal Medicine

## 2020-09-27 NOTE — Assessment & Plan Note (Signed)
Here for CPX HTN: Well-controlled today, reports good ambulatory BPs, continue Metoprolol. Asthma: Essentially asymptomatic on meds as needed. DJD: Aches and pains at baseline Osteopenia, remote h/o: check a DEXA B12 and vitamin D deficiency: On OTCs vitamin D, X21 and folic acid.  Checking labs. Morbid obesity: Counseled about diet and exercise. RTC 1 year.

## 2020-09-27 NOTE — Assessment & Plan Note (Signed)
-  Td 2017 - zostavax 2014 -Shingrix printed for pt before -pnm shot --2015;  prevnar 09-2016  - s/p covid x 3 - had a flu shot  -[facility]: Colonoscopy: 05/08/2004, neg--no polyps  Colonoscopy: 12-2009-- tics Cscope again 05-03-11, polyp Cscope 11-2016, to see GI next week -Prostate ca screening:  PSA per urology.   - remains active playing tennis  -Labs reviewed: BMP, T53, folic acid,CBC, U0E, TSH

## 2020-09-29 ENCOUNTER — Ambulatory Visit (AMBULATORY_SURGERY_CENTER): Payer: Self-pay

## 2020-09-29 ENCOUNTER — Encounter: Payer: Self-pay | Admitting: Gastroenterology

## 2020-09-29 ENCOUNTER — Other Ambulatory Visit: Payer: Self-pay

## 2020-09-29 VITALS — Ht 71.0 in | Wt 324.0 lb

## 2020-09-29 DIAGNOSIS — Z86010 Personal history of colonic polyps: Secondary | ICD-10-CM

## 2020-09-29 MED ORDER — PLENVU 140 G PO SOLR: 1.0000 | ORAL | 0 refills | Status: DC

## 2020-09-29 NOTE — Progress Notes (Signed)
No allergies to soy or egg Pt is not on blood thinners or diet pills Denies issues with sedation/intubation Denies atrial flutter/fib Denies constipation   Pt is aware of Covid safety and care partner requirements.      

## 2020-10-01 DIAGNOSIS — H401131 Primary open-angle glaucoma, bilateral, mild stage: Secondary | ICD-10-CM | POA: Diagnosis not present

## 2020-10-01 DIAGNOSIS — H35033 Hypertensive retinopathy, bilateral: Secondary | ICD-10-CM | POA: Diagnosis not present

## 2020-10-01 DIAGNOSIS — H04123 Dry eye syndrome of bilateral lacrimal glands: Secondary | ICD-10-CM | POA: Diagnosis not present

## 2020-10-01 DIAGNOSIS — H2513 Age-related nuclear cataract, bilateral: Secondary | ICD-10-CM | POA: Diagnosis not present

## 2020-10-10 ENCOUNTER — Telehealth: Payer: Self-pay | Admitting: Internal Medicine

## 2020-10-10 NOTE — Telephone Encounter (Signed)
LVM 10/10/20 @1 :15pm to resched appt khc  Left message for patient to reschedule Annual Wellness Visit.  Please schedule with Nurse Health Advisor Caroleen Hamman, RN at [facility]

## 2020-10-13 ENCOUNTER — Other Ambulatory Visit: Payer: Self-pay

## 2020-10-13 ENCOUNTER — Encounter: Payer: Self-pay | Admitting: Gastroenterology

## 2020-10-13 ENCOUNTER — Ambulatory Visit (AMBULATORY_SURGERY_CENTER): Payer: Medicare Other | Admitting: Gastroenterology

## 2020-10-13 VITALS — BP 96/58 | HR 81 | Temp 96.9°F | Resp 18 | Ht 71.0 in | Wt 324.0 lb

## 2020-10-13 DIAGNOSIS — Z86010 Personal history of colonic polyps: Secondary | ICD-10-CM

## 2020-10-13 MED ORDER — SODIUM CHLORIDE 0.9 % IV SOLN: 500.0000 mL | Freq: Once | INTRAVENOUS | Status: DC

## 2020-10-13 NOTE — Progress Notes (Signed)
Vital signs checked by:[place]  The patient states no changes in medical or surgical history since pre-visit screening on 09/29/20.

## 2020-10-13 NOTE — Op Note (Signed)
[place] Patient Name: Cameron [name] Procedure Date: 10/13/2020 2:51 PM MRN: [id number] Endoscopist: Mallie Mussel L. Loletha Carrow , MD Age: 74 Referring MD:  Date of Birth: 1946-08-15 Gender: Male Account #: [id number] Procedure:                Colonoscopy Indications:              Surveillance: Personal history of adenomatous                            polyps on last colonoscopy > 3 years ago (TA x 09 Nov 2016) Medicines:                Monitored Anesthesia Care Procedure:                Pre-Anesthesia Assessment:                           - Prior to the procedure, a History and Physical                            was performed, and patient medications and                            allergies were reviewed. The patient's tolerance of                            previous anesthesia was also reviewed. The risks                            and benefits of the procedure and the sedation                            options and risks were discussed with the patient.                            All questions were answered, and informed consent                            was obtained. Prior Anticoagulants: The patient has                            taken no previous anticoagulant or antiplatelet                            agents. ASA Grade Assessment: III - A patient with                            severe systemic disease. After reviewing the risks                            and benefits, the patient was deemed in  satisfactory condition to undergo the procedure.                           After obtaining informed consent, the colonoscope                            was passed under direct vision. Throughout the                            procedure, the patient's blood pressure, pulse, and                            oxygen saturations were monitored continuously. The                            Colonoscope was introduced through the anus and                             advanced to the the cecum, identified by                            appendiceal orifice and ileocecal valve. The                            colonoscopy was performed without difficulty. The                            patient tolerated the procedure well. The quality                            of the bowel preparation was good after lavage                            (except a small amount of fibrous food debris that                            could not be cleared from the cecum). The ileocecal                            valve, appendiceal orifice, and rectum were                            photographed. Scope In: 2:59:41 PM Scope Out: 3:12:41 PM Scope Withdrawal Time: 0 hours 7 minutes 29 seconds  Total Procedure Duration: 0 hours 13 minutes 0 seconds  Findings:                 The perianal and digital rectal examinations were                            normal.                           Many diverticula were found in the left colon and  right colon.                           The exam was otherwise without abnormality on                            direct and retroflexion views. Complications:            No immediate complications. Estimated Blood Loss:     Estimated blood loss: none. Impression:               - Diverticulosis in the left colon and in the right                            colon.                           - The examination was otherwise normal on direct                            and retroflexion views.                           - No specimens collected. Recommendation:           - Patient has a contact number available for                            emergencies. The signs and symptoms of potential                            delayed complications were discussed with the                            patient. Return to normal activities tomorrow.                            Written discharge instructions were provided to the                             patient.                           - Resume previous diet.                           - Continue present medications.                           - Based on age and current guidelines, no repeat                            routine screening/surveillance colonoscopy                            recommended. Raidyn Wassink L. Loletha Carrow, MD 10/13/2020 3:18:49 PM This report has been signed electronically.

## 2020-10-13 NOTE — Progress Notes (Signed)
pt tolerated well. VSS. awake and to recovery. Report given to RN.  

## 2020-10-13 NOTE — Patient Instructions (Signed)
Handout on diverticulosis given.   YOU HAD AN ENDOSCOPIC PROCEDURE TODAY AT THE [facility]:   Refer to the procedure report that was given to you for any specific questions about what was found during the examination.  If the procedure report does not answer your questions, please call your gastroenterologist to clarify.  If you requested that your care partner not be given the details of your procedure findings, then the procedure report has been included in a sealed envelope for you to review at your convenience later.  YOU SHOULD EXPECT: Some feelings of bloating in the abdomen. Passage of more gas than usual.  Walking can help get rid of the air that was put into your GI tract during the procedure and reduce the bloating. If you had a lower endoscopy (such as a colonoscopy or flexible sigmoidoscopy) you may notice spotting of blood in your stool or on the toilet paper. If you underwent a bowel prep for your procedure, you may not have a normal bowel movement for a few days.  Please Note:  You might notice some irritation and congestion in your nose or some drainage.  This is from the oxygen used during your procedure.  There is no need for concern and it should clear up in a day or so.  SYMPTOMS TO REPORT IMMEDIATELY:   Following lower endoscopy (colonoscopy or flexible sigmoidoscopy):  Excessive amounts of blood in the stool  Significant tenderness or worsening of abdominal pains  Swelling of the abdomen that is new, acute  Fever of 100F or higher   For urgent or emergent issues, a gastroenterologist can be reached at any hour by calling [phone]. Do not use MyChart messaging for urgent concerns.    DIET:  We do recommend a small meal at first, but then you may proceed to your regular diet.  Drink plenty of fluids but you should avoid alcoholic beverages for 24 hours.  ACTIVITY:  You should plan to take it easy for the rest of today and you should NOT DRIVE or use  heavy machinery until tomorrow (because of the sedation medicines used during the test).    FOLLOW UP: Our staff will call the number listed on your records 48-72 hours following your procedure to check on you and address any questions or concerns that you may have regarding the information given to you following your procedure. If we do not reach you, we will leave a message.  We will attempt to reach you two times.  During this call, we will ask if you have developed any symptoms of COVID 19. If you develop any symptoms (ie: fever, flu-like symptoms, shortness of breath, cough etc.) before then, please call [phone].  If you test positive for Covid 19 in the 2 weeks post procedure, please call and report this information to us.    If any biopsies were taken you will be contacted by phone or by letter within the next 1-3 weeks.  Please call us at [phone] if you have not heard about the biopsies in 3 weeks.    SIGNATURES/CONFIDENTIALITY: You and/or your care partner have signed paperwork which will be entered into your electronic medical record.  These signatures attest to the fact that that the information above on your After Visit Summary has been reviewed and is understood.  Full responsibility of the confidentiality of this discharge information lies with you and/or your care-partner. 

## 2020-10-15 ENCOUNTER — Telehealth: Payer: Self-pay | Admitting: *Deleted

## 2020-10-15 ENCOUNTER — Telehealth: Payer: Self-pay

## 2020-10-15 NOTE — Telephone Encounter (Signed)
  Follow up Call-  Call back number 10/13/2020  Post procedure Call Back phone  # [phone]  Permission to leave phone message Yes  Some recent data might be hidden     Patient questions:  Message to call us if necessary.

## 2020-10-16 ENCOUNTER — Ambulatory Visit: Payer: Self-pay | Admitting: *Deleted

## 2020-10-17 DIAGNOSIS — G4733 Obstructive sleep apnea (adult) (pediatric): Secondary | ICD-10-CM | POA: Diagnosis not present

## 2020-12-19 DIAGNOSIS — M25571 Pain in right ankle and joints of right foot: Secondary | ICD-10-CM | POA: Diagnosis not present

## 2021-01-11 ENCOUNTER — Other Ambulatory Visit: Payer: Self-pay | Admitting: Internal Medicine

## 2021-01-15 DIAGNOSIS — G4733 Obstructive sleep apnea (adult) (pediatric): Secondary | ICD-10-CM | POA: Diagnosis not present

## 2021-04-08 DIAGNOSIS — H401131 Primary open-angle glaucoma, bilateral, mild stage: Secondary | ICD-10-CM | POA: Diagnosis not present

## 2021-04-08 DIAGNOSIS — H35033 Hypertensive retinopathy, bilateral: Secondary | ICD-10-CM | POA: Diagnosis not present

## 2021-04-08 DIAGNOSIS — H2513 Age-related nuclear cataract, bilateral: Secondary | ICD-10-CM | POA: Diagnosis not present

## 2021-04-08 DIAGNOSIS — H04123 Dry eye syndrome of bilateral lacrimal glands: Secondary | ICD-10-CM | POA: Diagnosis not present

## 2021-04-10 ENCOUNTER — Other Ambulatory Visit: Payer: Self-pay | Admitting: Internal Medicine

## 2021-04-21 ENCOUNTER — Telehealth (INDEPENDENT_AMBULATORY_CARE_PROVIDER_SITE_OTHER): Payer: Medicare Other | Admitting: Internal Medicine

## 2021-04-21 ENCOUNTER — Encounter: Payer: Self-pay | Admitting: Internal Medicine

## 2021-04-21 VITALS — BP 130/88 | Temp 98.7°F | Ht 71.0 in | Wt 305.0 lb

## 2021-04-21 DIAGNOSIS — J069 Acute upper respiratory infection, unspecified: Secondary | ICD-10-CM

## 2021-04-21 NOTE — Progress Notes (Signed)
Subjective:    Patient ID: Cameron [name], male    DOB: 12-Aug-1946, 75 y.o.   MRN: [id number]  DOS:  04/21/2021 Type of visit - description: Virtual Visit via Video Note  I connected with the above patient  by a video enabled telemedicine application and verified that I am speaking with the correct person using two identifiers.   THIS ENCOUNTER IS A VIRTUAL VISIT DUE TO COVID-19 - PATIENT WAS NOT SEEN IN THE OFFICE. PATIENT HAS CONSENTED TO VIRTUAL VISIT / TELEMEDICINE VISIT   Location of patient: home  Location of provider: office  Persons participating in the virtual visit: patient, provider   I discussed the limitations of evaluation and management by telemedicine and the availability of in person appointments. The patient expressed understanding and agreed to proceed.  Acute Symptoms a started 3 days ago: Runny nose after he mowed his yard. He did have some scratchy throat but that seems to be better after he started Zyrtec and Mucinex  He specifically denies fever chills No nausea or vomiting No sneezing or itchy eyes. No chest pain no difficulty breathing No generalized aches and pains   Review of Systems See above   Past Medical History:  Diagnosis Date   Arthritis    degenerative joint disease, knees    Asthma    Constipation due to pain medication    Diverticulosis of colon    Fatty liver    GERD (gastroesophageal reflux disease)    takes Prevacid   Glaucoma (increased eye pressure)    High cholesterol    HTN (hypertension)    Iron deficiency anemia    h/o   Keloid of skin    Lumbar spondylosis    possible right sided radiculopathy with hx of surgery by Dr. Cyndy Freeze in 2003   Osteopenia    DEXA 12-2006     Personal history of colonic polyps 2002   TUBULAR ADENOMA   Prostatitis, acute 04-2011   admitted to [facility]   Sleep apnea    on CPAP, last study - [facility]   Tick bite     Past Surgical History:  Procedure Laterality Date   APPENDECTOMY  2017    [place]  2003   CHOLECYSTECTOMY  2003   COLONOSCOPY  2018   LAPAROSCOPIC APPENDECTOMY N/A 01/16/2016   Procedure: APPENDECTOMY LAPAROSCOPIC;  Surgeon: Fanny Skates, MD;  Location: [place];  Service: General;  Laterality: N/A;   LUMBAR LAMINECTOMY/DECOMPRESSION MICRODISCECTOMY N/A 08/22/2015   Procedure: Lumbar two to lumbar three lumbar three to lumbar four microdiskectomy;  Surgeon: Ashok Pall, MD;  Location: [place] [facility];  Service: Neurosurgery;  Laterality: N/A;  L23 L34 microdiskectomy   TOTAL KNEE ARTHROPLASTY Right 06/11/2014   Procedure: TOTAL KNEE ARTHROPLASTY;  Surgeon: Hessie Dibble, MD;  Location: [place];  Service: Orthopedics;  Laterality: Right;   TOTAL KNEE ARTHROPLASTY Left 10/08/2014   DR DALLDORF   TOTAL KNEE ARTHROPLASTY Left 10/08/2014   Procedure: TOTAL LEFT KNEE ARTHROPLASTY;  Surgeon: Hessie Dibble, MD;  Location: [place];  Service: Orthopedics;  Laterality: Left;    Allergies as of 04/21/2021       Reactions   Chlorhexidine Gluconate Hives, Other (See Comments)   Red rash developed on upper chest   Ciprofloxacin Hcl Itching   Clindamycin Itching, Rash        Medication List        Accurate as of April 21, 2021  4:29 PM. If you have any questions, ask your  nurse or doctor.          albuterol 108 (90 Base) MCG/ACT inhaler Commonly known as: VENTOLIN HFA Inhale 2 puffs into the lungs every 6 (six) hours as needed for wheezing or shortness of breath.   atorvastatin 10 MG tablet Commonly known as: LIPITOR Take 1 tablet (10 mg total) by mouth at bedtime.   brimonidine 0.2 % ophthalmic solution Commonly known as: ALPHAGAN 3 drops daily.   folic acid 1 MG tablet Commonly known as: FOLVITE Take 1 tablet (1 mg total) by mouth daily.   latanoprost 0.005 % ophthalmic solution Commonly known as: XALATAN Place 1 drop into both eyes at bedtime.   metoprolol succinate 25 MG 24 hr tablet Commonly known as: TOPROL-XL Take 0.5 tablets (12.5 mg total)  by mouth daily.   Myrbetriq 50 MG Tb24 tablet Generic drug: mirabegron ER Take 50 mg by mouth daily.   sildenafil 100 MG tablet Commonly known as: VIAGRA Take 1/2 to 1 tablet as needed.  Do not use if taking nitrates for heart disease.   vitamin C 1000 MG tablet Take 1,000 mg by mouth 2 (two) times daily.           Objective:   Physical Exam BP 130/88   Temp 98.7 F (37.1 C) (Oral)   Ht 5\' 11"  (1.803 m)   Wt (!) 305 lb (138.3 kg)   BMI 42.54 kg/m  This is a virtual video visit, I saw the patient, he seems well, at baseline, walking around his yard.  Blood pressure is good, no O2 sat available    Assessment      Assessment Hypertension OSA on CPAP GI: GERD, Fatty liver Asthma, previously RAD (occ wheezing w/ URIs, inhalers) DJD: lumbar spondylosis, back surgery 2003, knee replacements Morbid obesity  GU: --LUTS  Dr Venia Minks ([facility]) --h/o prostatitis admitted 2012 [facility] Glaucoma Osteopenia per DEXA 2008, multiple attempts to redo bone density test failed.  h/o iron deficiency  H/o  B12 and vit D def, 2016 levels wnl  PLAN: URI, allergies? Symptoms as described above, the patient looks well, has 3 COVID vaccines. Plan: Continue Zyrtec, Mucinex.  Get tested for COVID, call immediately if it is positive.  Call if symptoms are not gradually better.  Patient verbalized understood     I discussed the assessment and treatment plan with the patient. The patient was provided an opportunity to ask questions and all were answered. The patient agreed with the plan and demonstrated an understanding of the instructions.   The patient was advised to call back or seek an in-person evaluation if the symptoms worsen or if the condition fails to improve as anticipated.

## 2021-04-22 NOTE — Assessment & Plan Note (Signed)
URI, allergies? Symptoms as described above, the patient looks well, has 3 COVID vaccines. Plan: Continue Zyrtec, Mucinex.  Get tested for COVID, call immediately if it is positive.  Call if symptoms are not gradually better.  Patient verbalized understood

## 2021-04-27 DIAGNOSIS — B9689 Other specified bacterial agents as the cause of diseases classified elsewhere: Secondary | ICD-10-CM | POA: Diagnosis not present

## 2021-04-27 DIAGNOSIS — J329 Chronic sinusitis, unspecified: Secondary | ICD-10-CM | POA: Diagnosis not present

## 2021-05-13 DIAGNOSIS — H527 Unspecified disorder of refraction: Secondary | ICD-10-CM | POA: Diagnosis not present

## 2021-05-19 IMAGING — CR DG CHEST 2V
2 series · 2 of 2 positions shown · non-contrast
Comparison: July 03, 2018.

CLINICAL DATA: Cough and wheezing

EXAM:
CHEST - 2 VIEW

[w chest pa]
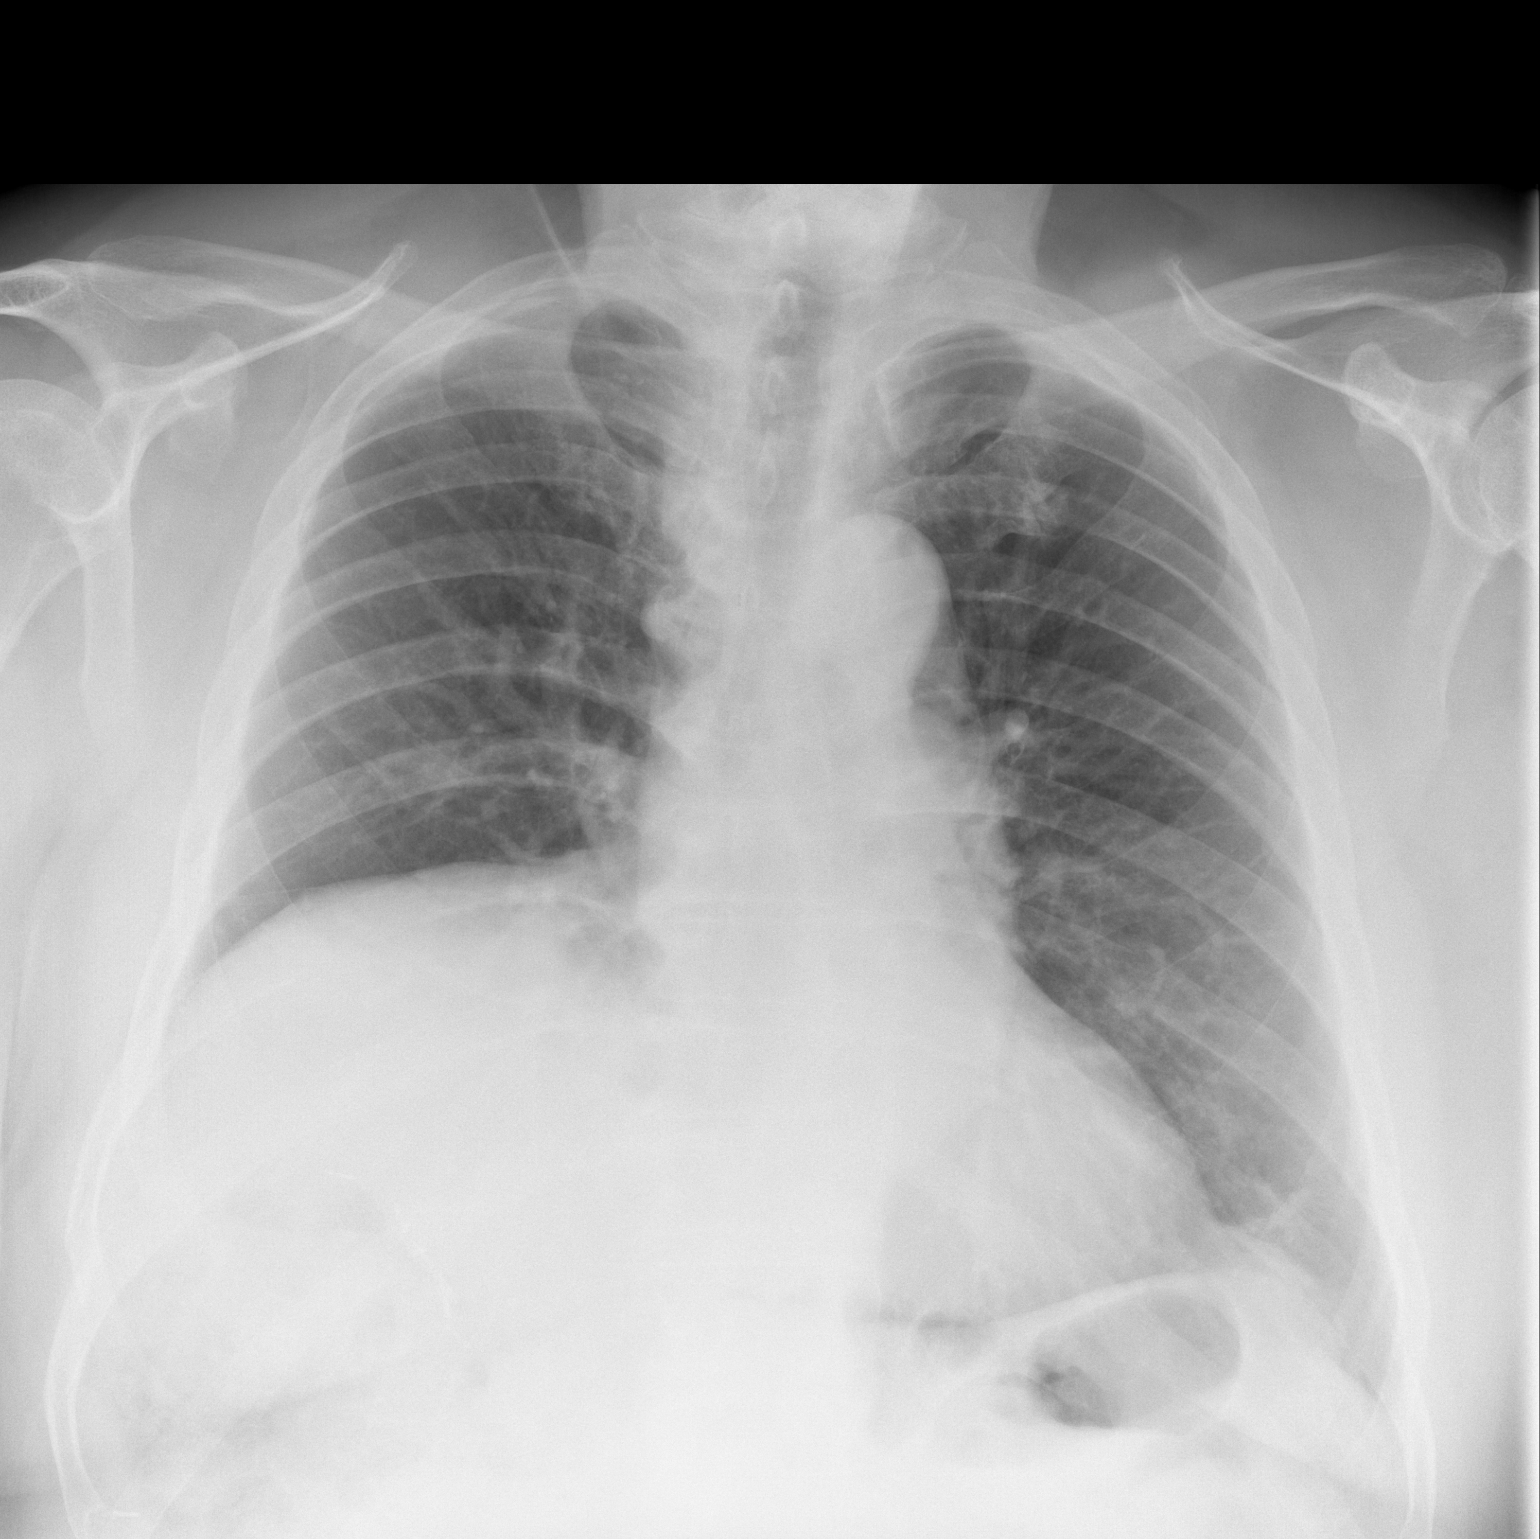

[w chest lat]
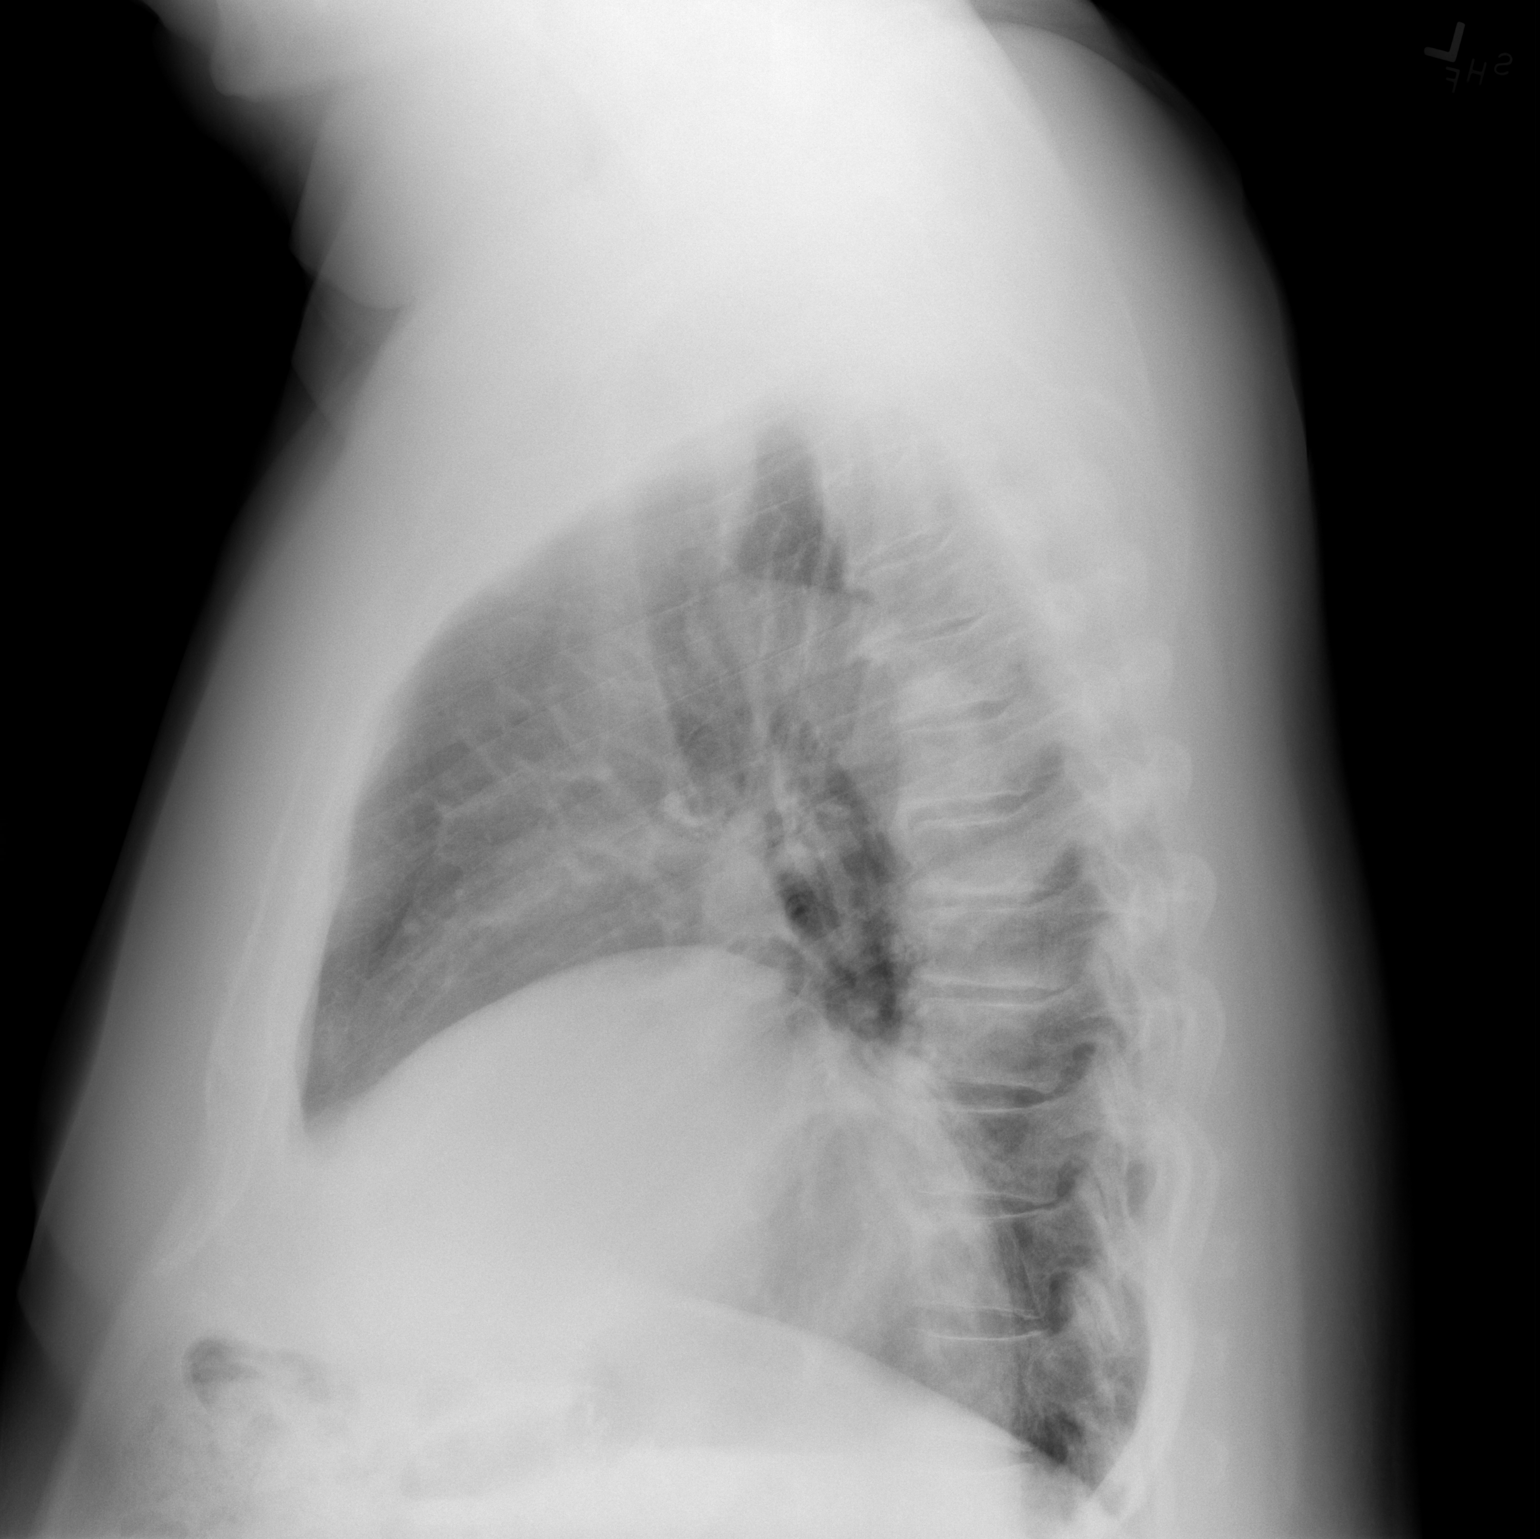

[2 of 2 positions shown; findings below may reference images not displayed]

FINDINGS: There is elevation of the right hemidiaphragm. There is some
atelectasis at the lung bases bilaterally. Heart size is stable.
There is no pneumothorax. No large pleural effusion. No acute
osseous abnormality.
IMPRESSION: No active cardiopulmonary disease.

## 2021-06-19 DIAGNOSIS — G4733 Obstructive sleep apnea (adult) (pediatric): Secondary | ICD-10-CM | POA: Diagnosis not present

## 2021-08-01 DIAGNOSIS — G4733 Obstructive sleep apnea (adult) (pediatric): Secondary | ICD-10-CM | POA: Diagnosis not present

## 2021-09-28 ENCOUNTER — Other Ambulatory Visit: Payer: Self-pay | Admitting: Internal Medicine

## 2021-10-05 ENCOUNTER — Other Ambulatory Visit: Payer: Self-pay

## 2021-10-05 ENCOUNTER — Ambulatory Visit (INDEPENDENT_AMBULATORY_CARE_PROVIDER_SITE_OTHER): Payer: Medicare Other | Admitting: Internal Medicine

## 2021-10-05 ENCOUNTER — Encounter: Payer: Self-pay | Admitting: Internal Medicine

## 2021-10-05 VITALS — BP 136/80 | HR 79 | Temp 97.8°F | Resp 18 | Ht 71.0 in | Wt 308.5 lb

## 2021-10-05 DIAGNOSIS — I10 Essential (primary) hypertension: Secondary | ICD-10-CM

## 2021-10-05 DIAGNOSIS — E559 Vitamin D deficiency, unspecified: Secondary | ICD-10-CM | POA: Diagnosis not present

## 2021-10-05 DIAGNOSIS — E538 Deficiency of other specified B group vitamins: Secondary | ICD-10-CM

## 2021-10-05 DIAGNOSIS — E785 Hyperlipidemia, unspecified: Secondary | ICD-10-CM

## 2021-10-05 DIAGNOSIS — Z23 Encounter for immunization: Secondary | ICD-10-CM | POA: Diagnosis not present

## 2021-10-05 DIAGNOSIS — R739 Hyperglycemia, unspecified: Secondary | ICD-10-CM | POA: Diagnosis not present

## 2021-10-05 DIAGNOSIS — Z0000 Encounter for general adult medical examination without abnormal findings: Secondary | ICD-10-CM

## 2021-10-05 LAB — COMPREHENSIVE METABOLIC PANEL
ALT: 19 U/L (ref 0–53)
AST: 19 U/L (ref 0–37)
Albumin: 4 g/dL (ref 3.5–5.2)
Alkaline Phosphatase: 95 U/L (ref 39–117)
BUN: 12 mg/dL (ref 6–23)
CO2: 32 mEq/L (ref 19–32)
Calcium: 9.2 mg/dL (ref 8.4–10.5)
Chloride: 101 mEq/L (ref 96–112)
Creatinine, Ser: 0.92 mg/dL (ref 0.40–1.50)
GFR: 82.81 mL/min (ref >60.00)
Glucose, Bld: 100 mg/dL — ABNORMAL HIGH (ref 70–99)
Potassium: 4.1 mEq/L (ref 3.5–5.1)
Sodium: 140 mEq/L (ref 135–145)
Total Bilirubin: 0.9 mg/dL (ref 0.2–1.2)
Total Protein: 6.5 g/dL (ref 6.0–8.3)

## 2021-10-05 LAB — LIPID PANEL
Cholesterol: 118 mg/dL (ref 0–200)
HDL: 38.8 mg/dL — ABNORMAL LOW (ref >39.00)
LDL Cholesterol: 70 mg/dL (ref 0–99)
NonHDL: 78.99
Total CHOL/HDL Ratio: 3
Triglycerides: 45 mg/dL (ref 0.0–149.0)
VLDL: 9 mg/dL (ref 0.0–40.0)

## 2021-10-05 LAB — CBC WITH DIFFERENTIAL/PLATELET
Basophils Absolute: 0 10*3/uL (ref 0.0–0.1)
Basophils Relative: 0.1 % (ref 0.0–3.0)
Eosinophils Absolute: 0.3 10*3/uL (ref 0.0–0.7)
Eosinophils Relative: 9.3 % — ABNORMAL HIGH (ref 0.0–5.0)
HCT: 40.2 % (ref 39.0–52.0)
Hemoglobin: 13.3 g/dL (ref 13.0–17.0)
Lymphocytes Relative: 28.7 % (ref 12.0–46.0)
Lymphs Abs: 1 10*3/uL (ref 0.7–4.0)
MCHC: 33.1 g/dL (ref 30.0–36.0)
MCV: 89.1 fl (ref 78.0–100.0)
Monocytes Absolute: 0.4 10*3/uL (ref 0.1–1.0)
Monocytes Relative: 10.1 % (ref 3.0–12.0)
Neutro Abs: 1.9 10*3/uL (ref 1.4–7.7)
Neutrophils Relative %: 51.8 % (ref 43.0–77.0)
Platelets: 207 10*3/uL (ref 150.0–400.0)
RBC: 4.52 Mil/uL (ref 4.22–5.81)
RDW: 13.8 % (ref 11.5–15.5)
WBC: 3.6 10*3/uL — ABNORMAL LOW (ref 4.0–10.5)

## 2021-10-05 LAB — HEMOGLOBIN A1C: Hgb A1c MFr Bld: 6 % (ref 4.6–6.5)

## 2021-10-05 LAB — VITAMIN B12: Vitamin B-12: 520 pg/mL (ref 211–911)

## 2021-10-05 LAB — VITAMIN D 25 HYDROXY (VIT D DEFICIENCY, FRACTURES): VITD: 35.21 ng/mL (ref 30.00–100.00)

## 2021-10-05 NOTE — Progress Notes (Addendum)
Subjective:    Patient ID: Cameron [name], male    DOB: 10-Aug-1946, 75 y.o.   MRN: [id number]  DOS:  10/05/2021 Type of visit - description: CPX  Here for CPX.  Has no major concerns except occasional pain at the fingers in both hands, mostly third and fourth digit.  No redness, swelling or warmness.  (Likely DJD)   Wt Readings from Last 3 Encounters:  10/05/21 (!) 308 lb 8 oz (139.9 kg)  04/21/21 (!) 305 lb (138.3 kg)  10/13/20 (!) 324 lb (147 kg)     Review of Systems  Other than above, a 14 point review of systems is negative     Past Medical History:  Diagnosis Date   Arthritis    degenerative joint disease, knees    Asthma    Constipation due to pain medication    Diverticulosis of colon    Fatty liver    GERD (gastroesophageal reflux disease)    takes Prevacid   Glaucoma (increased eye pressure)    High cholesterol    HTN (hypertension)    Iron deficiency anemia    h/o   Keloid of skin    Lumbar spondylosis    possible right sided radiculopathy with hx of surgery by Dr. Cyndy Freeze in 2003   Osteopenia    DEXA 12-2006     Personal history of colonic polyps 2002   TUBULAR ADENOMA   Prostatitis, acute 04-2011   admitted to [facility]   Sleep apnea    on CPAP, last study - [facility]   Tick bite     Past Surgical History:  Procedure Laterality Date   APPENDECTOMY  2017   [place]  2003   CHOLECYSTECTOMY  2003   COLONOSCOPY  2018   LAPAROSCOPIC APPENDECTOMY N/A 01/16/2016   Procedure: APPENDECTOMY LAPAROSCOPIC;  Surgeon: Fanny Skates, MD;  Location: [place];  Service: General;  Laterality: N/A;   LUMBAR LAMINECTOMY/DECOMPRESSION MICRODISCECTOMY N/A 08/22/2015   Procedure: Lumbar two to lumbar three lumbar three to lumbar four microdiskectomy;  Surgeon: Ashok Pall, MD;  Location: [place] [facility];  Service: Neurosurgery;  Laterality: N/A;  L23 L34 microdiskectomy   TOTAL KNEE ARTHROPLASTY Right 06/11/2014   Procedure: TOTAL KNEE ARTHROPLASTY;  Surgeon: Hessie Dibble, MD;  Location: [place];  Service: Orthopedics;  Laterality: Right;   TOTAL KNEE ARTHROPLASTY Left 10/08/2014   DR DALLDORF   TOTAL KNEE ARTHROPLASTY Left 10/08/2014   Procedure: TOTAL LEFT KNEE ARTHROPLASTY;  Surgeon: Hessie Dibble, MD;  Location: [employer];  Service: Orthopedics;  Laterality: Left;   Social History   Socioeconomic History   Marital status: Married    Spouse name: Not on file   Number of children: 2   Years of education: Not on file   Highest education level: Not on file  Occupational History   Occupation: retired--works in HR  Tobacco Use   Smoking status: Never   Smokeless tobacco: Never  Vaping Use   Vaping Use: Never used  Substance and Sexual Activity   Alcohol use: Yes    Comment: most at 1/month   Drug use: No   Sexual activity: Not on file  Other Topics Concern   Not on file  Social History Narrative   Household- pt and wife   plays tennis sometimes    Social Determinants of Health   Financial Resource Strain: Not on file  Food Insecurity: Not on file  Transportation Needs: Not on file  Physical Activity: Not on file  Stress: Not on file  Social Connections: Not on file  Intimate Partner Violence: Not on file    Allergies as of 10/05/2021       Reactions   Chlorhexidine Gluconate Hives, Other (See Comments)   Red rash developed on upper chest   Ciprofloxacin Hcl Itching   Clindamycin Itching, Rash        Medication List        Accurate as of October 05, 2021 11:59 PM. If you have any questions, ask your nurse or doctor.          albuterol 108 (90 Base) MCG/ACT inhaler Commonly known as: VENTOLIN HFA Inhale 2 puffs into the lungs every 6 (six) hours as needed for wheezing or shortness of breath.   atorvastatin 10 MG tablet Commonly known as: LIPITOR Take 1 tablet (10 mg total) by mouth at bedtime.   brimonidine 0.2 % ophthalmic solution Commonly known as: ALPHAGAN 3 drops daily.   folic acid 1 MG  tablet Commonly known as: FOLVITE TAKE 1 TABLET BY MOUTH  DAILY   latanoprost 0.005 % ophthalmic solution Commonly known as: XALATAN Place 1 drop into both eyes at bedtime.   metoprolol succinate 25 MG 24 hr tablet Commonly known as: TOPROL-XL Take 0.5 tablets (12.5 mg total) by mouth daily.   Myrbetriq 50 MG Tb24 tablet Generic drug: mirabegron ER Take 50 mg by mouth daily.   sildenafil 100 MG tablet Commonly known as: VIAGRA Take 1/2 to 1 tablet as needed.  Do not use if taking nitrates for heart disease.   vitamin C 1000 MG tablet Take 1,000 mg by mouth 2 (two) times daily.           Objective:   Physical Exam BP 136/80 (BP Location: Left Arm, Patient Position: Sitting, Cuff Size: Normal)   Pulse 79   Temp 97.8 F (36.6 C) (Oral)   Resp 18   Ht 5\' 11"  (1.803 m)   Wt (!) 308 lb 8 oz (139.9 kg)   SpO2 96%   BMI 43.03 kg/m  General: Well developed, NAD, BMI noted Neck: No  thyromegaly  HEENT:  Normocephalic . Face symmetric, atraumatic Lungs:  CTA B Normal respiratory effort, no intercostal retractions, no accessory muscle use. Heart: RRR,  no murmur.  Abdomen:  Not distended, soft, non-tender. No rebound or rigidity.   Lower extremities: trace pretibial edema bilaterally  Skin: Exposed areas without rash. Not pale. Not jaundice Neurologic:  alert & oriented X3.  Speech normal, gait appropriate for age and unassisted Strength symmetric and appropriate for age.  Psych: Cognition and judgment appear intact.  Cooperative with normal attention span and concentration.  Behavior appropriate. No anxious or depressed appearing.     Assessment     Assessment Hyperglycemia Hypertension High cholesterol OSA on CPAP GI: GERD, Fatty liver Asthma, previously RAD (occ wheezing w/ URIs, inhalers) DJD: lumbar spondylosis, back surgery 2003, knee replacements Morbid obesity  GU: --LUTS  Dr Venia Minks ([facility]) --h/o prostatitis admitted 2012 [facility] Glaucoma Osteopenia  per DEXA 2008, multiple attempts to redo bone density test failed.  h/o iron deficiency  H/o  B12 and vit D def, 2016 levels wnl  PLAN: Here for CPX HTN: BP is very good today, at home is within normal, continue metoprolol. High cholesterol: On Lipitor, checking labs Hyperglycemia: Check A1c OSA: On CPAP Asthma: Has not used albuterol in a while. Vitamin deficiencies: Checking labs RTC 1 year     This visit occurred during the SARS-CoV-2 public health  emergency.  Safety protocols were in place, including screening questions prior to the visit, additional usage of staff PPE, and extensive cleaning of exam room while observing appropriate contact time as indicated for disinfecting solutions.

## 2021-10-05 NOTE — Patient Instructions (Addendum)
Check the  blood pressure twice a month BP GOAL is between 110/65 and  135/85. If it is consistently higher or lower, let me know    GO TO THE LAB : Get the blood work     [place], [place] back for   a physical exam in 1 year   Fall Prevention in the Home, Adult Falls can cause injuries and affect people of all ages. There are many simple things that you can do to make your home safe and to help prevent falls. Ask for help when making these changes, if needed. What actions can I take to prevent falls? General instructions Use good lighting in all rooms. Replace any light bulbs that burn out, turn on lights if it is dark, and use night-lights. Place frequently used items in easy-to-reach places. Lower the shelves around your home if necessary. Set up furniture so that there are clear paths around it. Avoid moving your furniture around. Remove throw rugs and other tripping hazards from the floor. Avoid walking on wet floors. Fix any uneven floor surfaces. Add color or contrast paint or tape to grab bars and handrails in your home. Place contrasting color strips on the first and last steps of staircases. When you use a stepladder, make sure that it is completely opened and that the sides and supports are firmly locked. Have someone hold the ladder while you are using it. Do not climb a closed stepladder. Know where your pets are when moving through your home. What can I do in the bathroom?   Keep the floor dry. Immediately clean up any water that is on the floor. Remove soap buildup in the tub or shower regularly. Use nonskid mats or decals on the floor of the tub or shower. Attach bath mats securely with double-sided, nonslip rug tape. If you need to sit down while you are in the shower, use a plastic, nonslip stool. Install grab bars by the toilet and in the tub and shower. Do not use towel bars as grab bars. What can I do in the  bedroom? Make sure that a bedside light is easy to reach. Do not use oversized bedding that reaches the floor. Have a firm chair that has side arms to use for getting dressed. What can I do in the kitchen? Clean up any spills right away. If you need to reach for something above you, use a sturdy step stool that has a grab bar. Keep electrical cables out of the way. Do not use floor polish or wax that makes floors slippery. If you must use wax, make sure that it is non-skid floor wax. What can I do with my stairs? Do not leave any items on the stairs. Make sure that you have a light switch at the top and the bottom of the stairs. Have them installed if you do not have them. Make sure that there are handrails on both sides of the stairs. Fix handrails that are broken or loose. Make sure that handrails are as long as the staircases. Install non-slip stair treads on all stairs in your home. Avoid having throw rugs at the top or bottom of stairs, or secure the rugs with carpet tape to prevent them from moving. Choose a carpet design that does not hide the edge of steps on the stairs. Check any carpeting to make sure that it is firmly attached to the stairs. Fix any carpet that is loose or worn.  What can I do on the outside of my home? Use bright outdoor lighting. Regularly repair the edges of walkways and driveways and fix any cracks. Remove high doorway thresholds. Trim any shrubbery on the main path into your home. Regularly check that handrails are securely fastened and in good repair. Both sides of all steps should have handrails. Install guardrails along the edges of any raised decks or porches. Clear walkways of debris and clutter, including tools and rocks. Have leaves, snow, and ice cleared regularly. Use sand or salt on walkways during winter months. In the garage, clean up any spills right away, including grease or oil spills. What other actions can I take? Wear closed-toe shoes  that fit well and support your feet. Wear shoes that have rubber soles or low heels. Use mobility aids as needed, such as canes, walkers, scooters, and crutches. Review your medicines with your health care provider. Some medicines can cause dizziness or changes in blood pressure, which increase your risk of falling. Talk with your health care provider about other ways that you can decrease your risk of falls. This may include working with a physical therapist or trainer to improve your strength, balance, and endurance. Where to find more information Centers for Disease Control and Prevention, [employer]: http://www.wolf.info/ National Institute on Aging: http://kim-miller.com/ Contact a health care provider if: You are afraid of falling at home. You feel weak, drowsy, or dizzy at home. You fall at home. Summary There are many simple things that you can do to make your home safe and to help prevent falls. Ways to make your home safe include removing tripping hazards and installing grab bars in the bathroom. Ask for help when making these changes in your home. This information is not intended to replace advice given to you by your health care provider. Make sure you discuss any questions you have with your health care provider. Document Revised: 05/28/2020 Document Reviewed: 05/28/2020 Elsevier Patient Education  [place].

## 2021-10-06 ENCOUNTER — Encounter: Payer: Self-pay | Admitting: Internal Medicine

## 2021-10-06 NOTE — Assessment & Plan Note (Signed)
-  Td 2017 - s/p zostavax and  Shingrix   -PNM 23: 2015;  prevnar 09-2016.  PNM 23 booster 09-2021 - s/p covid : re bivalent vax at his convenience  - had a flu shot  -[facility]: Colonoscopy:  05/08/2004, neg--no polyps   Colonoscopy: 12-2009-- tics Cscope : 05-03-11, polyp Cscope 11-2016 C-scope12/2021.  Next per GI. -Prostate ca screening:  PSA per urology.   -Has lost some weight, he remains active, practicing portion control , praised  -Labs reviewed: CMP, FLP, CBC, A1c, vitamin D, B12 -Had one fall, prevention discussed - Advance care planning package provided

## 2021-10-06 NOTE — Assessment & Plan Note (Signed)
Assessment Hyperglycemia Hypertension High cholesterol OSA on CPAP GI: GERD, Fatty liver Asthma, previously RAD (occ wheezing w/ URIs, inhalers) DJD: lumbar spondylosis, back surgery 2003, knee replacements Morbid obesity  GU: --LUTS  Dr Venia Minks ([facility]) --h/o prostatitis admitted 2012 [facility] Glaucoma Osteopenia per DEXA 2008, multiple attempts to redo bone density test failed.  h/o iron deficiency  H/o  B12 and vit D def, 2016 levels wnl  PLAN: Here for CPX HTN: BP is very good today, at home is within normal, continue metoprolol. High cholesterol: On Lipitor, checking labs Hyperglycemia: Check A1c OSA: On CPAP Asthma: Has not used albuterol in a while. Vitamin deficiencies: Checking labs RTC 1 year

## 2021-10-18 ENCOUNTER — Other Ambulatory Visit: Payer: Self-pay | Admitting: Internal Medicine

## 2021-11-02 ENCOUNTER — Emergency Department (HOSPITAL_BASED_OUTPATIENT_CLINIC_OR_DEPARTMENT_OTHER)
Admission: EM | Admit: 2021-11-02 | Discharge: 2021-11-02 | Disposition: A | Discharge status: Home / Self Care | Payer: Medicare Other | Attending: Emergency Medicine | Admitting: Emergency Medicine

## 2021-11-02 ENCOUNTER — Emergency Department (HOSPITAL_BASED_OUTPATIENT_CLINIC_OR_DEPARTMENT_OTHER): Payer: Medicare Other

## 2021-11-02 ENCOUNTER — Encounter (HOSPITAL_BASED_OUTPATIENT_CLINIC_OR_DEPARTMENT_OTHER): Payer: Self-pay | Admitting: *Deleted

## 2021-11-02 ENCOUNTER — Other Ambulatory Visit: Payer: Self-pay

## 2021-11-02 DIAGNOSIS — J45909 Unspecified asthma, uncomplicated: Secondary | ICD-10-CM | POA: Diagnosis not present

## 2021-11-02 DIAGNOSIS — I10 Essential (primary) hypertension: Secondary | ICD-10-CM | POA: Insufficient documentation

## 2021-11-02 DIAGNOSIS — M25531 Pain in right wrist: Secondary | ICD-10-CM

## 2021-11-02 DIAGNOSIS — Z79899 Other long term (current) drug therapy: Secondary | ICD-10-CM | POA: Insufficient documentation

## 2021-11-02 DIAGNOSIS — Z96653 Presence of artificial knee joint, bilateral: Secondary | ICD-10-CM | POA: Insufficient documentation

## 2021-11-02 NOTE — ED Notes (Signed)
Patient discharged to home.  All discharge instructions reviewed.  Patient verbalized understanding via teachback method.  VS WDL.  Respirations even and unlabored.  Ambulatory out of ED.   °

## 2021-11-02 NOTE — Discharge Instructions (Signed)
Continue to wear wrist brace, as needed, for comfort.  Follow-up with your orthopedic doctor for reevaluation.

## 2021-11-02 NOTE — ED Triage Notes (Signed)
Right wrist swelling x 2 days.

## 2021-11-02 NOTE — ED Provider Notes (Signed)
[facility] Provider Note   CSN: [id number] Arrival date & time: 11/02/21  1446     History Chief Complaint  Patient presents with   Wrist Pain    Right    Cameron [name] is a 75 y.o. male.  HPI Patient presents for right wrist swelling.  He has had pain in his wrist for several weeks.  Pain is worsened with activities and movements.  He has been wearing a wrist brace while playing tennis to minimize the associated discomfort.  On Saturday, he was out of a ball game and give another family high-five.  At that time, he felt a shooting pain down his arm.  He has since had what he feels is a deformity to his right wrist.  Associated pain has not worsened since before Saturday.  He denies any other areas of discomfort.  He denies any systemic symptoms.    Past Medical History:  Diagnosis Date   Arthritis    degenerative joint disease, knees    Asthma    Constipation due to pain medication    Diverticulosis of colon    Fatty liver    GERD (gastroesophageal reflux disease)    takes Prevacid   Glaucoma (increased eye pressure)    High cholesterol    HTN (hypertension)    Iron deficiency anemia    h/o   Keloid of skin    Lumbar spondylosis    possible right sided radiculopathy with hx of surgery by Dr. Cyndy Freeze in 2003   Osteopenia    DEXA 12-2006     Personal history of colonic polyps 2002   TUBULAR ADENOMA   Prostatitis, acute 04-2011   admitted to [facility]   Sleep apnea    on CPAP, last study - [facility]   Tick bite     Patient Active Problem List   Diagnosis Date Noted   Healthcare maintenance 09/26/2019   Asthma 01/10/2018   Lumbar stenosis 08/22/2015   PCP NOTES >>>>> 07/28/2015   Primary osteoarthritis of knee 10/08/2014   Abnormal prostate specific antigen 08/16/2014   Detrusor muscle hypertonia 08/16/2014   Right knee DJD 06/11/2014   Morbid obesity ([place]) 06/11/2014   Cubital tunnel syndrome 05/19/2014   Annual physical exam  03/30/2012   B12 deficiency 03/30/2012   Osteopenia, low vit D 03/30/2012   Fatty liver 04/16/2011   Esophageal reflux 04/16/2011   OSA on CPAP 03/13/2009   Essential hypertension 08/02/2007   DIVERTICULOSIS, COLON 08/02/2007    Past Surgical History:  Procedure Laterality Date   APPENDECTOMY  2017   BACK SURGERY  2003   CHOLECYSTECTOMY  2003   COLONOSCOPY  2018   LAPAROSCOPIC APPENDECTOMY N/A 01/16/2016   Procedure: APPENDECTOMY LAPAROSCOPIC;  Surgeon: Fanny Skates, MD;  Location: [place];  Service: General;  Laterality: N/A;   LUMBAR LAMINECTOMY/DECOMPRESSION MICRODISCECTOMY N/A 08/22/2015   Procedure: Lumbar two to lumbar three lumbar three to lumbar four microdiskectomy;  Surgeon: Ashok Pall, MD;  Location: [place] [facility];  Service: Neurosurgery;  Laterality: N/A;  L23 L34 microdiskectomy   TOTAL KNEE ARTHROPLASTY Right 06/11/2014   Procedure: TOTAL KNEE ARTHROPLASTY;  Surgeon: Hessie Dibble, MD;  Location: [facility];  Service: Orthopedics;  Laterality: Right;   TOTAL KNEE ARTHROPLASTY Left 10/08/2014   DR DALLDORF   TOTAL KNEE ARTHROPLASTY Left 10/08/2014   Procedure: TOTAL LEFT KNEE ARTHROPLASTY;  Surgeon: Hessie Dibble, MD;  Location: [place];  Service: Orthopedics;  Laterality: Left;  Family History  Problem Relation Age of Onset   Dementia Mother    Diverticulitis Father    Colon cancer Maternal Uncle        dx age 72s   Colon cancer Other        grandfather, dx age 33   Stroke Other        maternal aunt in her 55s   Breast cancer Maternal Aunt    Prostate cancer Neg Hx    Heart attack Neg Hx    Hypertension Neg Hx    Diabetes Neg Hx    Rectal cancer Neg Hx    Stomach cancer Neg Hx     Social History   Tobacco Use   Smoking status: Never   Smokeless tobacco: Never  Vaping Use   Vaping Use: Never used  Substance Use Topics   Alcohol use: Yes    Comment: most at 1/month   Drug use: No    Home Medications Prior to Admission medications    Medication Sig Start Date End Date Taking? Authorizing Provider  albuterol (VENTOLIN HFA) 108 (90 Base) MCG/ACT inhaler Inhale 2 puffs into the lungs every 6 (six) hours as needed for wheezing or shortness of breath. 10/07/19   Evelina Dun A, FNP  Ascorbic Acid (VITAMIN C) 1000 MG tablet Take 1,000 mg by mouth 2 (two) times daily.     [provider]  atorvastatin (LIPITOR) 10 MG tablet TAKE 1 TABLET BY MOUTH AT  BEDTIME 10/19/21   Colon Branch, MD  brimonidine [facility]) 0.2 % ophthalmic solution 3 drops daily.  06/05/18   [provider]  folic acid (FOLVITE) 1 MG tablet TAKE 1 TABLET BY MOUTH  DAILY 09/28/21   Colon Branch, MD  latanoprost (XALATAN) 0.005 % ophthalmic solution Place 1 drop into both eyes at bedtime.  05/13/14   [provider]  metoprolol succinate (TOPROL-XL) 25 MG 24 hr tablet TAKE ONE-HALF TABLET BY  MOUTH DAILY 10/19/21   Colon Branch, MD  MYRBETRIQ 50 MG TB24 tablet Take 50 mg by mouth daily.  04/15/14   [provider]  sildenafil (VIAGRA) 100 MG tablet Take 1/2 to 1 tablet as needed.  Do not use if taking nitrates for heart disease. 09/12/20   [provider]    Allergies    Chlorhexidine gluconate, Ciprofloxacin hcl, and Clindamycin  Review of Systems   Review of Systems  Constitutional:  Negative for activity change, appetite change, chills, fatigue and fever.  HENT:  Negative for ear pain and sore throat.   Eyes:  Negative for pain and visual disturbance.  Respiratory:  Negative for cough and shortness of breath.   Cardiovascular:  Negative for chest pain and palpitations.  Gastrointestinal:  Negative for abdominal pain and vomiting.  Genitourinary:  Negative for dysuria and hematuria.  Musculoskeletal:  Positive for arthralgias and joint swelling. Negative for back pain, myalgias and neck pain.  Skin:  Negative for color change, rash and wound.  Neurological:  Negative for dizziness, seizures, syncope, weakness,  light-headedness and numbness.  All other systems reviewed and are negative.  Physical Exam Updated Vital Signs BP 135/87 (BP Location: Right Arm)    Pulse 72    Temp 98 F (36.7 C) (Oral)    Resp 16    Ht 5\' 11"  (1.803 m)    Wt (!) 139.9 kg    SpO2 98%    BMI 43.02 kg/m   Physical Exam Vitals and nursing note reviewed.  Constitutional:      General: He is not in acute distress.    Appearance: Normal appearance. He is well-developed. He is not ill-appearing, toxic-appearing or diaphoretic.  HENT:     Head: Normocephalic and atraumatic.     Right Ear: External ear normal.     Left Ear: External ear normal.  Eyes:     General: No scleral icterus.    Extraocular Movements: Extraocular movements intact.     Conjunctiva/sclera: Conjunctivae normal.  Cardiovascular:     Rate and Rhythm: Normal rate and regular rhythm.     Heart sounds: No murmur heard. Pulmonary:     Effort: Pulmonary effort is normal. No respiratory distress.     Breath sounds: Normal breath sounds. No wheezing or rales.  Abdominal:     Palpations: Abdomen is soft.     Tenderness: There is no abdominal tenderness.  Musculoskeletal:        General: Swelling present. No tenderness or deformity. Normal range of motion.     Cervical back: Normal range of motion and neck supple. No rigidity.  Skin:    General: Skin is warm and dry.     Capillary Refill: Capillary refill takes less than 2 seconds.     Coloration: Skin is not jaundiced or pale.  Neurological:     General: No focal deficit present.     Mental Status: He is alert and oriented to person, place, and time.     Cranial Nerves: No cranial nerve deficit.     Sensory: No sensory deficit.     Motor: No weakness.  Psychiatric:        Mood and Affect: Mood normal.        Behavior: Behavior normal.        Thought Content: Thought content normal.        Judgment: Judgment normal.    ED Results / Procedures / Treatments   Labs (all labs ordered are listed,  but only abnormal results are displayed) Labs Reviewed - No data to display  EKG None  Radiology DG Wrist Complete Right  Result Date: 11/02/2021 CLINICAL DATA:  Pain and swelling EXAM: RIGHT WRIST - COMPLETE 3+ VIEW COMPARISON:  None. FINDINGS: No recent fracture or dislocation is seen. In the lateral view, there is a 5 mm smooth marginated calcification along the palmar aspect of lunate which may be residual from previous injury. Degenerative changes are noted in the radial lunate joint. Degenerative changes are noted with joint space narrowing and bony spurs in the radiocarpal joints. Bony spurs are also noted in the intercarpal joints and first carpometacarpal joint. Bony spurs seen in the inferior radioulnar joint. There is soft tissue swelling around the wrist. There are no radiopaque foreign bodies. There is deformity in the shaft of right radius with lateral angulation which may be residual change from previous trauma. There is no break in the cortical margin in the visualized shaft of radius. IMPRESSION: No recent fracture is seen. 5 mm smooth marginated calcification seen along the palmar aspect of lunate may be residual from previous injury. Degenerative changes are noted in multiple joints as described in the body of the report. Electronically Signed   By: Elmer Picker M.D.   On: 11/02/2021 15:16   CT Wrist Right Wo Contrast  Result Date: 11/02/2021 CLINICAL DATA:  Recurrent right wrist pain for 1 month, swelling, no history of trauma EXAM: CT OF THE RIGHT WRIST WITHOUT CONTRAST TECHNIQUE: Multidetector CT imaging of the right wrist  was performed according to the standard protocol. Multiplanar CT image reconstructions were also generated. COMPARISON:  11/02/2021 FINDINGS: Bones/Joint/Cartilage There are no acute displaced fractures. Small osteophyte is identified at the articulation between the radius and the lunate, corresponding to the x-ray finding. There is severe diffuse  osteoarthritis greatest at the radiocarpal joint, primarily between the articulation between the radius and the lunate. There is slight volar subluxation of the lunate relative to the radius, without frank dislocation. Prominent joint space narrowing and osteophyte formation of the distal radioulnar joint as well. Moderate wrist effusion. Ligaments Suboptimally assessed by CT. Muscles and Tendons No gross abnormalities. Soft tissues Soft tissues are grossly normal.  No fluid collection. IMPRESSION: 1. No acute bony abnormality. 2. Severe osteoarthritis throughout the right wrist, greatest at the articulation between the radius and the lunate. Prominent osteophyte in this location corresponds to the ossific density seen on corresponding x-ray. 3. Moderate wrist effusion. Electronically Signed   By: Randa Ngo M.D.   On: 11/02/2021 19:54    Procedures Procedures   Medications Ordered in ED Medications - No data to display  ED Course  I have reviewed the triage vital signs and the nursing notes.  Pertinent labs & imaging results that were available during my care of the patient were reviewed by me and considered in my medical decision making (see chart for details).    MDM Rules/Calculators/A&P                         Healthy 75 year old male presenting for concern of right wrist deformity.  He suspects he may have injured it 2 days ago while getting another person a high-five.  He is well-appearing on exam.  He does have an area of bony swelling in the region of distal ulna.  This area is not significantly tender.  There is no overlying erythema.  Prior to being bedded in the ED, he did undergo x-ray imaging of this wrist.  X-rays showed chronic changes only, including evidence of previous injuries.  Patient feels quite certain that the body swelling is new.  CT imaging of wrist was ordered to further characterize the area.  CT scan showed severe osteoarthritis, particularly in areas of radiocarpal  joints.  There is a moderate wrist effusion.  Patient was advised to continue wearing wrist brace, as needed, for comfort.  He is also advised to follow-up with his orthopedic doctor.  Patient does have an established doctor that he sees.  He was discharged in good condition.   Final Clinical Impression(s) / ED Diagnoses Final diagnoses:  Right wrist pain    Rx / DC Orders ED Discharge Orders     None        Godfrey Pick, MD 11/03/21 [phone]

## 2021-11-16 DIAGNOSIS — M25531 Pain in right wrist: Secondary | ICD-10-CM | POA: Diagnosis not present

## 2021-12-01 DIAGNOSIS — G4733 Obstructive sleep apnea (adult) (pediatric): Secondary | ICD-10-CM | POA: Diagnosis not present

## 2022-01-04 NOTE — Progress Notes (Addendum)
Subjective:   Cameron [name] is a 76 y.o. male who presents for Medicare Annual/Subsequent preventive examination.  I connected with Cameron Taison today by telephone and verified that I am speaking with the correct person using two identifiers. Location patient: home Location provider: work Persons participating in the virtual visit: patient, Marine scientist.    I discussed the limitations, risks, security and privacy concerns of performing an evaluation and management service by telephone and the availability of in person appointments. I also discussed with the patient that there may be a patient responsible charge related to this service. The patient expressed understanding and verbally consented to this telephonic visit.    Interactive audio and video telecommunications were attempted between this provider and patient, however failed, due to patient having technical difficulties OR patient did not have access to video capability.  We continued and completed visit with audio only.  Some vital signs may be absent or patient reported.   Time Spent with patient on telephone encounter: 20 minutes   Review of Systems     Cardiac Risk Factors include: advanced age (>89men, >65 women);male gender;hypertension;dyslipidemia;obesity (BMI >30kg/m2)     Objective:    Today's Vitals   01/05/22 0940  Weight: (!) 308 lb (139.7 kg)  Height: 5\' 11"  (1.803 m)   Body mass index is 42.96 kg/m.  Advanced Directives 01/05/2022 11/02/2021 04/28/2020 10/16/2019 12/05/2017 10/30/2017 01/16/2016  Does Patient Have a Medical Advance Directive? No No Yes No No No No  Does patient want to make changes to medical advance directive? - - - No - Patient declined - - -  Would patient like information on creating a medical advance directive? No - Patient declined No - Patient declined - - No - Patient declined - -    Current Medications (verified) Outpatient Encounter Medications as of 01/05/2022  Medication Sig    albuterol (VENTOLIN HFA) 108 (90 Base) MCG/ACT inhaler Inhale 2 puffs into the lungs every 6 (six) hours as needed for wheezing or shortness of breath.   Ascorbic Acid (VITAMIN C) 1000 MG tablet Take 1,000 mg by mouth 2 (two) times daily.    atorvastatin (LIPITOR) 10 MG tablet TAKE 1 TABLET BY MOUTH AT  BEDTIME   brimonidine (ALPHAGAN) 0.2 % ophthalmic solution 3 drops daily.    folic acid (FOLVITE) 1 MG tablet TAKE 1 TABLET BY MOUTH  DAILY   latanoprost (XALATAN) 0.005 % ophthalmic solution Place 1 drop into both eyes at bedtime.    metoprolol succinate (TOPROL-XL) 25 MG 24 hr tablet TAKE ONE-HALF TABLET BY  MOUTH DAILY   MYRBETRIQ 50 MG TB24 tablet Take 50 mg by mouth daily.    sildenafil (VIAGRA) 100 MG tablet Take 1/2 to 1 tablet as needed.  Do not use if taking nitrates for heart disease.   No facility-administered encounter medications on file as of 01/05/2022.    Allergies (verified) Chlorhexidine gluconate, Ciprofloxacin hcl, and Clindamycin   History: Past Medical History:  Diagnosis Date   Arthritis    degenerative joint disease, knees    Asthma    Constipation due to pain medication    Diverticulosis of colon    Fatty liver    GERD (gastroesophageal reflux disease)    takes Prevacid   Glaucoma (increased eye pressure)    High cholesterol    HTN (hypertension)    Iron deficiency anemia    h/o   Keloid of skin    Lumbar spondylosis    possible right sided radiculopathy with hx  of surgery by Dr. Cyndy Freeze in 2003   Osteopenia    DEXA 12-2006     Personal history of colonic polyps 2002   TUBULAR ADENOMA   Prostatitis, acute 04-2011   admitted to [facility]   Sleep apnea    on CPAP, last study - [facility]   Tick bite    Past Surgical History:  Procedure Laterality Date   APPENDECTOMY  2017   [place]  2003   CHOLECYSTECTOMY  2003   COLONOSCOPY  2018   LAPAROSCOPIC APPENDECTOMY N/A 01/16/2016   Procedure: APPENDECTOMY LAPAROSCOPIC;  Surgeon: Fanny Skates, MD;  Location: [place];  Service: General;  Laterality: N/A;   LUMBAR LAMINECTOMY/DECOMPRESSION MICRODISCECTOMY N/A 08/22/2015   Procedure: Lumbar two to lumbar three lumbar three to lumbar four microdiskectomy;  Surgeon: Ashok Pall, MD;  Location: [place] [facility];  Service: Neurosurgery;  Laterality: N/A;  L23 L34 microdiskectomy   TOTAL KNEE ARTHROPLASTY Right 06/11/2014   Procedure: TOTAL KNEE ARTHROPLASTY;  Surgeon: Hessie Dibble, MD;  Location: [place];  Service: Orthopedics;  Laterality: Right;   TOTAL KNEE ARTHROPLASTY Left 10/08/2014   DR DALLDORF   TOTAL KNEE ARTHROPLASTY Left 10/08/2014   Procedure: TOTAL LEFT KNEE ARTHROPLASTY;  Surgeon: Hessie Dibble, MD;  Location: [place];  Service: Orthopedics;  Laterality: Left;   Family History  Problem Relation Age of Onset   Dementia Mother    Diverticulitis Father    Colon cancer Maternal Uncle        dx age 57s   Colon cancer Other        grandfather, dx age 40   Stroke Other        maternal aunt in her 44s   Breast cancer Maternal Aunt    Prostate cancer Neg Hx    Heart attack Neg Hx    Hypertension Neg Hx    Diabetes Neg Hx    Rectal cancer Neg Hx    Stomach cancer Neg Hx    Social History   Socioeconomic History   Marital status: Married    Spouse name: Not on file   Number of children: 2   Years of education: Not on file   Highest education level: Not on file  Occupational History   Occupation: retired--works in HR  Tobacco Use   Smoking status: Never   Smokeless tobacco: Never  Vaping Use   Vaping Use: Never used  Substance and Sexual Activity   Alcohol use: Yes    Comment: most at 1/month   Drug use: No   Sexual activity: Not on file  Other Topics Concern   Not on file  Social History Narrative   Household- pt and wife   plays tennis sometimes    Social Determinants of Radio broadcast assistant Strain: Low Risk    Difficulty of Paying Living Expenses: Not hard at all  Food Insecurity: No Food Insecurity   Worried  About Charity fundraiser in the Last Year: Never true   Arboriculturist in the Last Year: Never true  Transportation Needs: No Transportation Needs   Lack of Transportation (Medical): No   Lack of Transportation (Non-Medical): No  Physical Activity: Insufficiently Active   Days of Exercise per Week: 1 day   Minutes of Exercise per Session: 90 min  Stress: No Stress Concern Present   Feeling of Stress : Not at all  Social Connections: Socially Integrated   Frequency of Communication with Friends and Family: More than  three times a week   Frequency of Social Gatherings with Friends and Family: More than three times a week   Attends Religious Services: More than 4 times per year   Active Member of Clubs or Organizations: No   Attends Music therapist: More than 4 times per year   Marital Status: Married    Tobacco Counseling Counseling given: Not Answered   Clinical Intake:  Pre-visit preparation completed: Yes  Pain : No/denies pain     BMI - recorded: 42.96 Nutritional Status: BMI > 30  Obese Nutritional Risks: None Diabetes: No  How often do you need to have someone help you when you read instructions, pamphlets, or other written materials from your doctor or pharmacy?: 1 - Never  Diabetic?No  Interpreter Needed?: No  Information entered by :: Caroleen Hamman LPN   Activities of Daily Living In your present state of health, do you have any difficulty performing the following activities: 01/05/2022 04/21/2021  Hearing? N N  Vision? N N  Difficulty concentrating or making decisions? N N  Walking or climbing stairs? N N  Dressing or bathing? N N  Doing errands, shopping? N N  Preparing Food and eating ? N -  Using the Toilet? N -  In the past six months, have you accidently leaked urine? N -  Do you have problems with loss of bowel control? N -  Managing your Medications? N -  Managing your Finances? N -  Housekeeping or managing your Housekeeping? N  -  Some recent data might be hidden    Patient Care Team: Colon Branch, MD as PCP - General Melrose Nakayama, MD as Consulting Physician (Orthopedic Surgery) Ulyses Southward., MD (Urology) Rigoberto Noel, MD as Consulting Physician (Pulmonary Disease)  Indicate any recent Medical Services you may have received from other than [facility] providers in the past year (date may be approximate).     Assessment:   This is a routine wellness examination for Cameron [name].  Hearing/Vision screen Hearing Screening - Comments:: No issues Vision Screening - Comments:: Last eye exam-09/2021-Dr. Jerline Pain  Dietary issues and exercise activities discussed: Current Exercise Habits: Structured exercise class, Type of exercise: Other - see comments (tennis), Time (Minutes): 60, Frequency (Times/Week): 1, Weekly Exercise (Minutes/Week): 60, Intensity: Moderate   Goals Addressed             This Visit's Progress    Patient Stated   On track    Maintain healthy lifestyle.       Depression Screen PHQ 2/9 Scores 01/05/2022 10/05/2021 04/21/2021 10/16/2019 09/25/2019 09/19/2018 09/14/2017  PHQ - 2 Score 0 0 0 0 0 0 0    Fall Risk Fall Risk  01/05/2022 10/05/2021 04/21/2021 10/16/2019 09/25/2019  Falls in the past year? 1 1 0 0 1  Number falls in past yr: 0 0 0 - 0  Injury with Fall? 0 0 0 - 0  Follow up Falls prevention discussed Falls evaluation completed - Education provided;Falls prevention discussed Falls evaluation completed    FALL RISK PREVENTION PERTAINING TO THE HOME:  Any stairs in or around the home? Yes  If so, are there any without handrails? No  Home free of loose throw rugs in walkways, pet beds, electrical cords, etc? Yes  Adequate lighting in your home to reduce risk of falls? Yes   ASSISTIVE DEVICES UTILIZED TO PREVENT FALLS:  Life alert? No  Use of a cane, walker or w/c? No  Grab bars in the bathroom? No  Shower chair or bench in shower? Yes  Elevated toilet seat or a handicapped  toilet? Yes   TIMED UP AND GO:  Was the test performed? No . Phone visit   Cognitive Function:Normal cognitive status assessed by this Nurse Health Advisor. No abnormalities found.          Immunizations Immunization History  Administered Date(s) Administered   Fluad Quad(high Dose 65+) 07/24/2019   Influenza Split 07/09/2014, 09/08/2021   Influenza Whole 08/09/2008, 07/10/2011   Influenza, High Dose Seasonal PF 09/19/2018   Influenza,inj,Quad PF,6+ Mos 07/08/2016   Influenza-Unspecified 08/30/2017, 08/08/2020   PFIZER(Purple Top)SARS-COV-2 Vaccination 11/27/2019, 12/19/2019, 08/06/2020, 05/31/2021   Pfizer Covid-19 Vaccine Bivalent Booster 64yrs & up 10/13/2021   Pneumococcal Conjugate-13 09/10/2016   Pneumococcal Polysaccharide-23 07/23/2014, 10/05/2021   Td 06/08/2006, 09/10/2016   Zoster Recombinat (Shingrix) 07/03/2021, 09/08/2021   Zoster, Live 04/06/2013    TDAP status: Up to date  Flu Vaccine status: Up to date  Pneumococcal vaccine status: Up to date  Covid-19 vaccine status: Completed vaccines  Qualifies for Shingles Vaccine? No   Zostavax completed Yes   Shingrix Completed?: Yes  Screening Tests Health Maintenance  Topic Date Due   TETANUS/TDAP  09/10/2026   Pneumonia Vaccine 25+ Years old  Completed   INFLUENZA VACCINE  Completed   COVID-19 Vaccine  Completed   Hepatitis C Screening  Completed   Zoster Vaccines- Shingrix  Completed   HPV VACCINES  Aged Out   COLONOSCOPY (Pts 45-50yrs Insurance coverage will need to be confirmed)  Discontinued    Health Maintenance  There are no preventive care reminders to display for this patient.  Colorectal cancer screening: No longer required.   Lung Cancer Screening: (Low Dose CT Chest recommended if Age 43-80 years, 30 pack-year currently smoking OR have quit w/in 15years.) does not qualify.     Additional Screening:  Hepatitis C Screening: Completed 04/16/2011  Vision Screening: Recommended annual  ophthalmology exams for early detection of glaucoma and other disorders of the eye. Is the patient up to date with their annual eye exam?  Yes  Who is the provider or what is the name of the office in which the patient attends annual eye exams? Dr. Jerline Pain   Dental Screening: Recommended annual dental exams for proper oral hygiene  Community Resource Referral / Chronic Care Management: CRR required this visit?  No   CCM required this visit?  No      Plan:     I have personally reviewed and noted the following in the patients chart:   Medical and social history Use of alcohol, tobacco or illicit drugs  Current medications and supplements including opioid prescriptions. Patient is not currently taking opioid prescriptions. Functional ability and status Nutritional status Physical activity Advanced directives List of other physicians Hospitalizations, surgeries, and ER visits in previous 12 months Vitals Screenings to include cognitive, depression, and falls Referrals and appointments  In addition, I have reviewed and discussed with patient certain preventive protocols, quality metrics, and best practice recommendations. A written personalized care plan for preventive services as well as general preventive health recommendations were provided to patient.   Due to this being a telephonic visit, the after visit summary with patients personalized plan was offered to patient via mail or my-chart. Patient would like to access on my-chart.  Marta Antu, LPN   [id number]  Nurse health Advisor  Nurse Notes: None  I have reviewed and agree with Health Coaches documentation.  Kathlene November, MD

## 2022-01-05 ENCOUNTER — Ambulatory Visit (INDEPENDENT_AMBULATORY_CARE_PROVIDER_SITE_OTHER): Payer: Medicare Other

## 2022-01-05 VITALS — Ht 71.0 in | Wt 308.0 lb

## 2022-01-05 DIAGNOSIS — Z0000 Encounter for general adult medical examination without abnormal findings: Secondary | ICD-10-CM | POA: Diagnosis not present

## 2022-01-05 NOTE — Patient Instructions (Signed)
[name] , Thank you for taking time to complete your Medicare Wellness Visit. I appreciate your ongoing commitment to your health goals. Please review the following plan we discussed and let me know if I can assist you in the future.   Screening recommendations/referrals: Colonoscopy: No longer required Recommended yearly ophthalmology/optometry visit for glaucoma screening and checkup Recommended yearly dental visit for hygiene and checkup  Vaccinations: Influenza vaccine: Up to date Pneumococcal vaccine: Up to date Tdap vaccine: Up to date Shingles vaccine: Up to date   Covid-19: Up to date  Advanced directives: Declined information today  Conditions/risks identified: See problem list  Next appointment: Follow up in one year for your annual wellness visit.   Preventive Care 74 Years and Older, Male Preventive care refers to lifestyle choices and visits with your health care provider that can promote health and wellness. What does preventive care include? A yearly physical exam. This is also called an annual well check. Dental exams once or twice a year. Routine eye exams. Ask your health care provider how often you should have your eyes checked. Personal lifestyle choices, including: Daily care of your teeth and gums. Regular physical activity. Eating a healthy diet. Avoiding tobacco and drug use. Limiting alcohol use. Practicing safe sex. Taking low doses of aspirin every day. Taking vitamin and mineral supplements as recommended by your health care provider. What happens during an annual well check? The services and screenings done by your health care provider during your annual well check will depend on your age, overall health, lifestyle risk factors, and family history of disease. Counseling  Your health care provider may ask you questions about your: Alcohol use. Tobacco use. Drug use. Emotional well-being. Home and relationship well-being. Sexual  activity. Eating habits. History of falls. Memory and ability to understand (cognition). Work and work Statistician. Screening  You may have the following tests or measurements: Height, weight, and BMI. Blood pressure. Lipid and cholesterol levels. These may be checked every 5 years, or more frequently if you are over 74 years old. Skin check. Lung cancer screening. You may have this screening every year starting at age 74 if you have a 30-pack-year history of smoking and currently smoke or have quit within the past 15 years. Fecal occult blood test (FOBT) of the stool. You may have this test every year starting at age 74. Flexible sigmoidoscopy or colonoscopy. You may have a sigmoidoscopy every 5 years or a colonoscopy every 10 years starting at age 74. Prostate cancer screening. Recommendations will vary depending on your family history and other risks. Hepatitis C blood test. Hepatitis B blood test. Sexually transmitted disease (STD) testing. Diabetes screening. This is done by checking your blood sugar (glucose) after you have not eaten for a while (fasting). You may have this done every 1-3 years. Abdominal aortic aneurysm (AAA) screening. You may need this if you are a current or former smoker. Osteoporosis. You may be screened starting at age 74 if you are at high risk. Talk with your health care provider about your test results, treatment options, and if necessary, the need for more tests. Vaccines  Your health care provider may recommend certain vaccines, such as: Influenza vaccine. This is recommended every year. Tetanus, diphtheria, and acellular pertussis (Tdap, Td) vaccine. You may need a Td booster every 10 years. Zoster vaccine. You may need this after age 74. Pneumococcal 13-valent conjugate (PCV13) vaccine. One dose is recommended after age 74. Pneumococcal polysaccharide (PPSV23) vaccine. One dose is recommended  after age 74. Talk to your health care provider about which  screenings and vaccines you need and how often you need them. This information is not intended to replace advice given to you by your health care provider. Make sure you discuss any questions you have with your health care provider. Document Released: 11/21/2015 Document Revised: 07/14/2016 Document Reviewed: 08/26/2015 Elsevier Interactive Patient Education  2017 [place] Prevention in the Home Falls can cause injuries. They can happen to people of all ages. There are many things you can do to make your home safe and to help prevent falls. What can I do on the outside of my home? Regularly fix the edges of walkways and driveways and fix any cracks. Remove anything that might make you trip as you walk through a door, such as a raised step or threshold. Trim any bushes or trees on the path to your home. Use bright outdoor lighting. Clear any walking paths of anything that might make someone trip, such as rocks or tools. Regularly check to see if handrails are loose or broken. Make sure that both sides of any steps have handrails. Any raised decks and porches should have guardrails on the edges. Have any leaves, snow, or ice cleared regularly. Use sand or salt on walking paths during winter. Clean up any spills in your garage right away. This includes oil or grease spills. What can I do in the bathroom? Use night lights. Install grab bars by the toilet and in the tub and shower. Do not use towel bars as grab bars. Use non-skid mats or decals in the tub or shower. If you need to sit down in the shower, use a plastic, non-slip stool. Keep the floor dry. Clean up any water that spills on the floor as soon as it happens. Remove soap buildup in the tub or shower regularly. Attach bath mats securely with double-sided non-slip rug tape. Do not have throw rugs and other things on the floor that can make you trip. What can I do in the bedroom? Use night lights. Make sure that you have a  light by your bed that is easy to reach. Do not use any sheets or blankets that are too big for your bed. They should not hang down onto the floor. Have a firm chair that has side arms. You can use this for support while you get dressed. Do not have throw rugs and other things on the floor that can make you trip. What can I do in the kitchen? Clean up any spills right away. Avoid walking on wet floors. Keep items that you use a lot in easy-to-reach places. If you need to reach something above you, use a strong step stool that has a grab bar. Keep electrical cords out of the way. Do not use floor polish or wax that makes floors slippery. If you must use wax, use non-skid floor wax. Do not have throw rugs and other things on the floor that can make you trip. What can I do with my stairs? Do not leave any items on the stairs. Make sure that there are handrails on both sides of the stairs and use them. Fix handrails that are broken or loose. Make sure that handrails are as long as the stairways. Check any carpeting to make sure that it is firmly attached to the stairs. Fix any carpet that is loose or worn. Avoid having throw rugs at the top or bottom of the stairs. If you  do have throw rugs, attach them to the floor with carpet tape. Make sure that you have a light switch at the top of the stairs and the bottom of the stairs. If you do not have them, ask someone to add them for you. What else can I do to help prevent falls? Wear shoes that: Do not have high heels. Have rubber bottoms. Are comfortable and fit you well. Are closed at the toe. Do not wear sandals. If you use a stepladder: Make sure that it is fully opened. Do not climb a closed stepladder. Make sure that both sides of the stepladder are locked into place. Ask someone to hold it for you, if possible. Clearly mark and make sure that you can see: Any grab bars or handrails. First and last steps. Where the edge of each step  is. Use tools that help you move around (mobility aids) if they are needed. These include: Canes. Walkers. Scooters. Crutches. Turn on the lights when you go into a dark area. Replace any light bulbs as soon as they burn out. Set up your furniture so you have a clear path. Avoid moving your furniture around. If any of your floors are uneven, fix them. If there are any pets around you, be aware of where they are. Review your medicines with your doctor. Some medicines can make you feel dizzy. This can increase your chance of falling. Ask your doctor what other things that you can do to help prevent falls. This information is not intended to replace advice given to you by your health care provider. Make sure you discuss any questions you have with your health care provider. Document Released: 08/21/2009 Document Revised: 04/01/2016 Document Reviewed: 11/29/2014 Elsevier Interactive Patient Education  2017 [employer].

## 2022-01-07 DIAGNOSIS — N138 Other obstructive and reflux uropathy: Secondary | ICD-10-CM | POA: Diagnosis not present

## 2022-01-07 DIAGNOSIS — N3281 Overactive bladder: Secondary | ICD-10-CM | POA: Diagnosis not present

## 2022-02-12 DIAGNOSIS — N138 Other obstructive and reflux uropathy: Secondary | ICD-10-CM | POA: Diagnosis not present

## 2022-02-18 DIAGNOSIS — M1612 Unilateral primary osteoarthritis, left hip: Secondary | ICD-10-CM | POA: Diagnosis not present

## 2022-03-01 DIAGNOSIS — G4733 Obstructive sleep apnea (adult) (pediatric): Secondary | ICD-10-CM | POA: Diagnosis not present

## 2022-03-02 ENCOUNTER — Other Ambulatory Visit: Payer: Self-pay | Admitting: Internal Medicine

## 2022-03-18 DIAGNOSIS — K4020 Bilateral inguinal hernia, without obstruction or gangrene, not specified as recurrent: Secondary | ICD-10-CM | POA: Diagnosis not present

## 2022-03-18 DIAGNOSIS — N3281 Overactive bladder: Secondary | ICD-10-CM | POA: Diagnosis not present

## 2022-03-18 DIAGNOSIS — K5730 Diverticulosis of large intestine without perforation or abscess without bleeding: Secondary | ICD-10-CM | POA: Diagnosis not present

## 2022-03-18 DIAGNOSIS — Z9049 Acquired absence of other specified parts of digestive tract: Secondary | ICD-10-CM | POA: Diagnosis not present

## 2022-03-18 DIAGNOSIS — N200 Calculus of kidney: Secondary | ICD-10-CM | POA: Diagnosis not present

## 2022-04-21 DIAGNOSIS — H401131 Primary open-angle glaucoma, bilateral, mild stage: Secondary | ICD-10-CM | POA: Diagnosis not present

## 2022-04-21 DIAGNOSIS — H2513 Age-related nuclear cataract, bilateral: Secondary | ICD-10-CM | POA: Diagnosis not present

## 2022-04-21 DIAGNOSIS — H04123 Dry eye syndrome of bilateral lacrimal glands: Secondary | ICD-10-CM | POA: Diagnosis not present

## 2022-04-21 DIAGNOSIS — H35033 Hypertensive retinopathy, bilateral: Secondary | ICD-10-CM | POA: Diagnosis not present

## 2022-05-05 DIAGNOSIS — M1612 Unilateral primary osteoarthritis, left hip: Secondary | ICD-10-CM | POA: Diagnosis not present

## 2022-06-03 DIAGNOSIS — G4733 Obstructive sleep apnea (adult) (pediatric): Secondary | ICD-10-CM | POA: Diagnosis not present

## 2022-06-07 DIAGNOSIS — M1612 Unilateral primary osteoarthritis, left hip: Secondary | ICD-10-CM | POA: Diagnosis not present

## 2022-06-09 ENCOUNTER — Telehealth: Payer: Self-pay

## 2022-06-09 ENCOUNTER — Ambulatory Visit (INDEPENDENT_AMBULATORY_CARE_PROVIDER_SITE_OTHER): Payer: Medicare Other | Admitting: Internal Medicine

## 2022-06-09 ENCOUNTER — Other Ambulatory Visit: Payer: Self-pay | Admitting: Internal Medicine

## 2022-06-09 ENCOUNTER — Encounter: Payer: Self-pay | Admitting: Internal Medicine

## 2022-06-09 VITALS — BP 126/84 | HR 76 | Temp 97.8°F | Resp 18 | Ht 71.0 in | Wt 308.0 lb

## 2022-06-09 DIAGNOSIS — I10 Essential (primary) hypertension: Secondary | ICD-10-CM

## 2022-06-09 DIAGNOSIS — M159 Polyosteoarthritis, unspecified: Secondary | ICD-10-CM | POA: Diagnosis not present

## 2022-06-09 DIAGNOSIS — M1990 Unspecified osteoarthritis, unspecified site: Secondary | ICD-10-CM | POA: Insufficient documentation

## 2022-06-09 HISTORY — DX: Unspecified osteoarthritis, unspecified site: M19.90

## 2022-06-09 MED ORDER — WEGOVY 0.25 MG/0.5ML ~~LOC~~ SOAJ: 0.2500 mg | SUBCUTANEOUS | 0 refills | Status: DC

## 2022-06-09 MED ORDER — POTASSIUM CHLORIDE CRYS ER 10 MEQ PO TBCR: 10.0000 meq | EXTENDED_RELEASE_TABLET | Freq: Every day | ORAL | 0 refills | Status: DC

## 2022-06-09 MED ORDER — HYDROCHLOROTHIAZIDE 25 MG PO TABS: 25.0000 mg | ORAL_TABLET | Freq: Every day | ORAL | 0 refills | Status: DC

## 2022-06-09 NOTE — Telephone Encounter (Signed)
PA initiated via Covermymeds; [id number]. Awaiting determination.

## 2022-06-09 NOTE — Assessment & Plan Note (Signed)
DJD: Needs a L hip replacement, has not achieve the weight goal that we will allow him to proceed with the surgery Morbid obesity: Has been morbidly obese for many years, started to lose weight few months ago, in his own scales he has lost 11 pounds.  He still needs to lose 10 additional pounds to reach the threshold that would allow hip replacement. Requested diuretics due to mild edema. Plan: - Start Maxide which he has used before along with KCl.  BMP in 2 weeks.  Monitor BPs, may need to decrease metoprolol dose. - Continue with his healthy diet. - Request weight loss medication, Rx for [facility] sent, he is aware that is likely not covered by his insurance. HTN: See above, were starting Maxide to help with the edema, monitor BPs closely, if needed hold metoprolol.  See AVS.

## 2022-06-09 NOTE — Telephone Encounter (Signed)
PA denied. Drugs, when used for anorexia, weight loss or weight gain are excluded from coverage under Medicare rules.

## 2022-06-09 NOTE — Patient Instructions (Addendum)
Start hydrochlorothiazide 25 mg 1 tablet every morning  Also start potassium 10 mEq 1 tablet daily.  Check the  blood pressure regularly BP GOAL is between 110/65 and  135/85. If it is consistently higher or lower, let me know  If your blood pressure is in the low side consistently or you get dizzy: Hold metoprolol.  We will try to prescribe you Wegovy.  If you are able to obtain the medication let us know for teaching.  GO TO THE FRONT DESK, PLEASE SCHEDULE YOUR APPOINTMENTS Come back for   blood work in 2 weeks.  Recommend to proceed with covid booster (bivalent) at your pharmacy. Flu shot this fall.

## 2022-06-09 NOTE — Progress Notes (Signed)
Subjective:    Patient ID: Cameron [name], male    DOB: 10/04/1946, 76 y.o.   MRN: [id number]  DOS:  06/09/2022 Type of visit - description: To discuss weight loss  In need of L hip replacement. He is morbidly obese, has been for years, has been losing some weight but has not been able to get to the threshold that will allow the surgery to happen.  Requests help.  Complaining of lower extremity edema, sometimes is swelling enough that he cannot put his shoes on   Wt Readings from Last 3 Encounters:  06/09/22 (!) 308 lb (139.7 kg)  01/05/22 (!) 308 lb (139.7 kg)  11/02/21 (!) 308 lb 6.8 oz (139.9 kg)     Review of Systems See above   Past Medical History:  Diagnosis Date   Arthritis    degenerative joint disease, knees    Asthma    Constipation due to pain medication    Diverticulosis of colon    Fatty liver    GERD (gastroesophageal reflux disease)    takes Prevacid   Glaucoma (increased eye pressure)    High cholesterol    HTN (hypertension)    Iron deficiency anemia    h/o   Keloid of skin    Lumbar spondylosis    possible right sided radiculopathy with hx of surgery by Dr. Cyndy Freeze in 2003   Osteopenia    DEXA 12-2006     Personal history of colonic polyps 2002   TUBULAR ADENOMA   Prostatitis, acute 04-2011   admitted to [facility]   Sleep apnea    on CPAP, last study - [facility]   Tick bite     Past Surgical History:  Procedure Laterality Date   APPENDECTOMY  2017   [place]  2003   CHOLECYSTECTOMY  2003   COLONOSCOPY  2018   LAPAROSCOPIC APPENDECTOMY N/A 01/16/2016   Procedure: APPENDECTOMY LAPAROSCOPIC;  Surgeon: Fanny Skates, MD;  Location: [place];  Service: General;  Laterality: N/A;   LUMBAR LAMINECTOMY/DECOMPRESSION MICRODISCECTOMY N/A 08/22/2015   Procedure: Lumbar two to lumbar three lumbar three to lumbar four microdiskectomy;  Surgeon: Ashok Pall, MD;  Location: [place] [facility];  Service: Neurosurgery;  Laterality: N/A;  L23 L34 microdiskectomy    TOTAL KNEE ARTHROPLASTY Right 06/11/2014   Procedure: TOTAL KNEE ARTHROPLASTY;  Surgeon: Hessie Dibble, MD;  Location: [place];  Service: Orthopedics;  Laterality: Right;   TOTAL KNEE ARTHROPLASTY Left 10/08/2014   DR DALLDORF   TOTAL KNEE ARTHROPLASTY Left 10/08/2014   Procedure: TOTAL LEFT KNEE ARTHROPLASTY;  Surgeon: Hessie Dibble, MD;  Location: [place];  Service: Orthopedics;  Laterality: Left;    Current Outpatient Medications  Medication Instructions   albuterol (VENTOLIN HFA) 108 (90 Base) MCG/ACT inhaler 2 puffs, Inhalation, Every 6 hours PRN   atorvastatin (LIPITOR) 10 MG tablet TAKE 1 TABLET BY MOUTH AT  BEDTIME   brimonidine (ALPHAGAN) 0.2 % ophthalmic solution 3 drops, Daily   folic acid (FOLVITE) 1 MG tablet TAKE 1 TABLET BY MOUTH DAILY   latanoprost (XALATAN) 0.005 % ophthalmic solution 1 drop, Daily at bedtime   metoprolol succinate (TOPROL-XL) 25 MG 24 hr tablet TAKE ONE-HALF TABLET BY  MOUTH DAILY   Myrbetriq 50 mg, Daily   sildenafil (VIAGRA) 100 MG tablet Take 1/2 to 1 tablet as needed.  Do not use if taking nitrates for heart disease.   vitamin C 1,000 mg, Oral, 2 times daily       Objective:  Physical Exam BP 126/84   Pulse 76   Temp 97.8 F (36.6 C) (Oral)   Resp 18   Ht '5\' 11"'$  (1.803 m)   Wt (!) 308 lb (139.7 kg)   SpO2 96%   BMI 42.96 kg/m  General:   Well developed, NAD, BMI noted. HEENT:  Normocephalic . Face symmetric, atraumatic Lungs:  CTA B Normal respiratory effort, no intercostal retractions, no accessory muscle use. Heart: RRR,  no murmur.  Lower extremities: Trace pedal, peri ankle and distal pretibial edema Skin: Not pale. Not jaundice Neurologic:  alert & oriented X3.  Speech normal, gait appropriate for age and unassisted Psych--  Cognition and judgment appear intact.  Cooperative with normal attention span and concentration.  Behavior appropriate. No anxious or depressed appearing.      Assessment      Assessment Hyperglycemia Hypertension High cholesterol OSA on CPAP GI: GERD, Fatty liver Asthma, previously RAD (occ wheezing w/ URIs, inhalers) DJD: lumbar spondylosis, back surgery 2003, knee replacements Morbid obesity  GU: --LUTS  Dr Venia Minks ([facility]) --h/o prostatitis admitted 2012 [facility] Glaucoma Osteopenia per DEXA 2008, multiple attempts to redo bone density test failed.  h/o iron deficiency  H/o  B12 and vit D def, 2016 levels wnl  PLAN: DJD: Needs a L hip replacement, has not achieve the weight goal that we will allow him to proceed with the surgery Morbid obesity: Has been morbidly obese for many years, started to lose weight few months ago, in his own scales he has lost 11 pounds.  He still needs to lose 10 additional pounds to reach the threshold that would allow hip replacement. Requested diuretics due to mild edema. Plan: - Start Maxide which he has used before along with KCl.  BMP in 2 weeks.  Monitor BPs, may need to decrease metoprolol dose. - Continue with his healthy diet. - Request weight loss medication, Rx for [facility] sent, he is aware that is likely not covered by his insurance. HTN: See above, were starting Maxide to help with the edema, monitor BPs closely, if needed hold metoprolol.  See AVS.

## 2022-06-23 ENCOUNTER — Other Ambulatory Visit (INDEPENDENT_AMBULATORY_CARE_PROVIDER_SITE_OTHER): Payer: Medicare Other

## 2022-06-23 DIAGNOSIS — I10 Essential (primary) hypertension: Secondary | ICD-10-CM | POA: Diagnosis not present

## 2022-06-23 LAB — BASIC METABOLIC PANEL
BUN: 16 mg/dL (ref 6–23)
CO2: 32 mEq/L (ref 19–32)
Calcium: 9.4 mg/dL (ref 8.4–10.5)
Chloride: 101 mEq/L (ref 96–112)
Creatinine, Ser: 0.98 mg/dL (ref 0.40–1.50)
GFR: 76.38 mL/min (ref >60.00)
Glucose, Bld: 104 mg/dL — ABNORMAL HIGH (ref 70–99)
Potassium: 3.9 mEq/L (ref 3.5–5.1)
Sodium: 140 mEq/L (ref 135–145)

## 2022-06-24 MED ORDER — HYDROCHLOROTHIAZIDE 25 MG PO TABS: 25.0000 mg | ORAL_TABLET | Freq: Every day | ORAL | 1 refills | Status: DC

## 2022-06-24 MED ORDER — POTASSIUM CHLORIDE CRYS ER 10 MEQ PO TBCR: 10.0000 meq | EXTENDED_RELEASE_TABLET | Freq: Every day | ORAL | 1 refills | Status: DC

## 2022-06-24 NOTE — Addendum Note (Signed)
Addended byDamita Dunnings D on: 06/24/2022 01:30 PM   Modules accepted: Orders

## 2022-07-02 ENCOUNTER — Telehealth: Payer: Self-pay | Admitting: Internal Medicine

## 2022-07-02 DIAGNOSIS — M1612 Unilateral primary osteoarthritis, left hip: Secondary | ICD-10-CM | POA: Diagnosis not present

## 2022-07-02 NOTE — Telephone Encounter (Signed)
Appt scheduled w. Dr. Nani Ravens 07/06/22- needs EKG.

## 2022-07-02 NOTE — Telephone Encounter (Signed)
Received- Pt needing L total hip arthroplasty with Dr. Rhona Raider. Surgery date TBD. Will hold until PCP has returned to office.

## 2022-07-02 NOTE — Telephone Encounter (Signed)
Patient's wife dropped off surgical clearance forms and requested for pt summary and labs from last visit to be faxed with it. Forms placed on provider's box.

## 2022-07-02 NOTE — Telephone Encounter (Signed)
Opened in error

## 2022-07-06 ENCOUNTER — Ambulatory Visit (INDEPENDENT_AMBULATORY_CARE_PROVIDER_SITE_OTHER): Payer: Medicare Other | Admitting: Family Medicine

## 2022-07-06 ENCOUNTER — Encounter: Payer: Self-pay | Admitting: Family Medicine

## 2022-07-06 VITALS — BP 120/80 | HR 89 | Temp 98.2°F | Ht 72.0 in | Wt 300.2 lb

## 2022-07-06 DIAGNOSIS — Z01818 Encounter for other preprocedural examination: Secondary | ICD-10-CM | POA: Diagnosis not present

## 2022-07-06 NOTE — Telephone Encounter (Addendum)
Pt cleared for surgery per Dr. Nani Ravens. Form completed and faxed to [phone] w/ today's OV note and EKG. Form sent for scanning.

## 2022-07-06 NOTE — Telephone Encounter (Signed)
Received fax confirmation

## 2022-07-06 NOTE — Progress Notes (Signed)
Subjective:   Chief Complaint  Patient presents with   Medical Clearance    Cameron [name]  is here for a Pre-operative physical at the request of [facility].  He  is having left hip arthroplasty surgery for arthritis.  Surgery date TBD based off of today's evaluation.  Personal or family hx of adverse outcome to anesthesia? No  Chipped, cracked, missing, or loose teeth? No  Decreased ROM of neck? No  Able to walk up 2 flights of stairs without becoming significantly short of breath or having chest pain? Yes  No chest pain or shortness of breath in general, he denies any urinary symptoms.  He is not on any anticoagulation or antiplatelets.  No history of arrhythmia, coronary artery disease, CVA, or angina.  Revised Goldman Criteria: High Risk Surgery (intraperitoneal, intrathoracic, aortic): No  Ischemic heart disease (Prior MI, +excercise stress test, angina, nitrate use, Qwave): No  History of heart failure: No  History of cerebrovascular disease: No  History of diabetes: No  Insulin therapy for DM: No  Preoperative Cr >2.0: No   Patient Active Problem List   Diagnosis Date Noted   DJD (degenerative joint disease) 06/09/2022   Healthcare maintenance 09/26/2019   Asthma 01/10/2018   Lumbar stenosis 08/22/2015   PCP NOTES >>>>> 07/28/2015   Abnormal prostate specific antigen 08/16/2014   Detrusor muscle hypertonia 08/16/2014   Morbid obesity ([place]) 06/11/2014   Cubital tunnel syndrome 05/19/2014   Annual physical exam 03/30/2012   B12 deficiency 03/30/2012   Osteopenia, low vit D 03/30/2012   Fatty liver 04/16/2011   Esophageal reflux 04/16/2011   OSA on CPAP 03/13/2009   Essential hypertension 08/02/2007   DIVERTICULOSIS, COLON 08/02/2007   Past Medical History:  Diagnosis Date   Arthritis    degenerative joint disease, knees    Asthma    Constipation due to pain medication    Diverticulosis of colon    Fatty liver    GERD (gastroesophageal reflux disease)    takes  Prevacid   Glaucoma (increased eye pressure)    High cholesterol    HTN (hypertension)    Iron deficiency anemia    h/o   Keloid of skin    Lumbar spondylosis    possible right sided radiculopathy with hx of surgery by Dr. Cyndy Freeze in 2003   Osteopenia    DEXA 12-2006     Personal history of colonic polyps 2002   TUBULAR ADENOMA   Prostatitis, acute 04-2011   admitted [facility]   Sleep apnea    on CPAP, last study - [facility]   Tick bite     Past Surgical History:  Procedure Laterality Date   APPENDECTOMY  2017   [place]  2003   CHOLECYSTECTOMY  2003   COLONOSCOPY  2018   LAPAROSCOPIC APPENDECTOMY N/A 01/16/2016   Procedure: APPENDECTOMY LAPAROSCOPIC;  Surgeon: Fanny Skates, MD;  Location: [place];  Service: General;  Laterality: N/A;   LUMBAR LAMINECTOMY/DECOMPRESSION MICRODISCECTOMY N/A 08/22/2015   Procedure: Lumbar two to lumbar three lumbar three to lumbar four microdiskectomy;  Surgeon: Ashok Pall, MD;  Location: [place] [facility];  Service: Neurosurgery;  Laterality: N/A;  L23 L34 microdiskectomy   TOTAL KNEE ARTHROPLASTY Right 06/11/2014   Procedure: TOTAL KNEE ARTHROPLASTY;  Surgeon: Hessie Dibble, MD;  Location: [place];  Service: Orthopedics;  Laterality: Right;   TOTAL KNEE ARTHROPLASTY Left 10/08/2014   DR DALLDORF   TOTAL KNEE ARTHROPLASTY Left 10/08/2014   Procedure: TOTAL LEFT KNEE ARTHROPLASTY;  Surgeon:  Hessie Dibble, MD;  Location: [place];  Service: Orthopedics;  Laterality: Left;    Current Outpatient Medications  Medication Sig Dispense Refill   albuterol (VENTOLIN HFA) 108 (90 Base) MCG/ACT inhaler Inhale 2 puffs into the lungs every 6 (six) hours as needed for wheezing or shortness of breath. (Patient not taking: Reported on 06/09/2022) 8 g 0   Ascorbic Acid (VITAMIN C) 1000 MG tablet Take 1,000 mg by mouth 2 (two) times daily.      atorvastatin (LIPITOR) 10 MG tablet TAKE 1 TABLET BY MOUTH AT  BEDTIME 90 tablet 3   brimonidine (ALPHAGAN) 0.2 % ophthalmic solution 3  drops daily.      folic acid (FOLVITE) 1 MG tablet TAKE 1 TABLET BY MOUTH DAILY 90 tablet 2   hydrochlorothiazide (HYDRODIURIL) 25 MG tablet Take 1 tablet (25 mg total) by mouth daily. 90 tablet 1   latanoprost (XALATAN) 0.005 % ophthalmic solution Place 1 drop into both eyes at bedtime.      metoprolol succinate (TOPROL-XL) 25 MG 24 hr tablet TAKE ONE-HALF TABLET BY  MOUTH DAILY 45 tablet 3   MYRBETRIQ 50 MG TB24 tablet Take 50 mg by mouth daily.      potassium chloride (KLOR-CON M) 10 MEQ tablet Take 1 tablet (10 mEq total) by mouth daily. 90 tablet 1   Semaglutide-Weight Management (WEGOVY) 0.25 MG/0.5ML SOAJ Inject 0.25 mg into the skin once a week. 2 mL 0   sildenafil (VIAGRA) 100 MG tablet Take 1/2 to 1 tablet as needed.  Do not use if taking nitrates for heart disease.     Allergies  Allergen Reactions   Chlorhexidine Gluconate Hives and Other (See Comments)    Red rash developed on upper chest    Ciprofloxacin Hcl Itching   Clindamycin Itching and Rash    Family History  Problem Relation Age of Onset   Dementia Mother    Diverticulitis Father    Colon cancer Maternal Uncle        dx age 31s   Colon cancer Other        grandfather, dx age 71   Stroke Other        maternal aunt in her 67s   Breast cancer Maternal Aunt    Prostate cancer Neg Hx    Heart attack Neg Hx    Hypertension Neg Hx    Diabetes Neg Hx    Rectal cancer Neg Hx    Stomach cancer Neg Hx      Review of Systems:  Constitutional:  no fevers Eye:  no recent significant change in vision Ear:  no hearing loss Nose/Mouth/Throat:  No dental complaints Neck/Thyroid:  no lumps or masses Pulmonary:  No shortness of breath Cardiovascular:  no chest pain Gastrointestinal:  no abdominal pain GU:  negative for dysuria Musculoskeletal/Extremities:  no new pain Skin/Integumentary ROS:  no abnormal skin lesions reported Neurologic:  no HA   Objective:   Vitals:   07/06/22 0939  BP: 120/80  Pulse: 89   Temp: 98.2 F (36.8 C)  TempSrc: Oral  SpO2: 95%  Weight: (!) 300 lb 4 oz (136.2 kg)  Height: 6' (1.829 m)   Body mass index is 40.72 kg/m.  General:  well developed, well nourished, in no apparent distress Skin:  warm, no pallor or diaphoresis Head:  normocephalic, atraumatic Eyes:  pupils equal and round, sclera anicteric without injection Ears:  canals without lesions, TMs shiny without retraction, no obvious effusion, no erythema Throat/Pharynx:  lips  and gingiva without lesion; tongue and uvula midline; non-inflamed pharynx; no exudates or postnasal drainage Neck: neck supple without adenopathy, thyromegaly, or masses, no bruits, no jugular venous distention Lungs:  clear to auscultation, breath sounds equal bilaterally, no respiratory distress Cardio:  regular rate and rhythm without murmurs Abdomen:  abdomen soft, nontender; bowel sounds normal; no masses, hepatomegaly or splenomegaly Musculoskeletal:  symmetrical muscle groups noted without atrophy or deformity Extremities:  no clubbing, cyanosis, or edema, no deformities, no skin discoloration Neuro:  gait normal Psych: Age appropriate judgment and insight; normal mood   Assessment:   Preoperative clearance - Plan: EKG 12-Lead   Plan:   EKG - EKG shows NSR, normal axis, no interval abnormalities, no ST segment or T wave changes, good R wave progression. 2 ventricular beats noted.  No labs required for this pre op.  Pending the above workup, the patient is deemed low cardiac risk (0.4% on revised Goldman) for the proposed procedure. He is medically optimized from our standpoint.   The patient voiced understanding and agreement to the plan.  [place], DO 07/06/22  10:51 AM

## 2022-07-06 NOTE — Patient Instructions (Signed)
Should be no issues with medications and surgery.   No issues from our end.   Let us know if you need anything.

## 2022-07-14 DIAGNOSIS — M1612 Unilateral primary osteoarthritis, left hip: Secondary | ICD-10-CM | POA: Diagnosis not present

## 2022-08-05 DIAGNOSIS — M25652 Stiffness of left hip, not elsewhere classified: Secondary | ICD-10-CM | POA: Diagnosis not present

## 2022-08-05 DIAGNOSIS — M1612 Unilateral primary osteoarthritis, left hip: Secondary | ICD-10-CM | POA: Diagnosis not present

## 2022-08-05 DIAGNOSIS — R262 Difficulty in walking, not elsewhere classified: Secondary | ICD-10-CM | POA: Diagnosis not present

## 2022-08-16 DIAGNOSIS — M1612 Unilateral primary osteoarthritis, left hip: Secondary | ICD-10-CM | POA: Diagnosis not present

## 2022-08-16 DIAGNOSIS — M25552 Pain in left hip: Secondary | ICD-10-CM | POA: Diagnosis not present

## 2022-08-26 DIAGNOSIS — Z96642 Presence of left artificial hip joint: Secondary | ICD-10-CM | POA: Diagnosis not present

## 2022-08-26 DIAGNOSIS — M1612 Unilateral primary osteoarthritis, left hip: Secondary | ICD-10-CM | POA: Diagnosis not present

## 2022-08-26 HISTORY — PX: HIP ARTHROSCOPY: SUR88

## 2022-08-31 DIAGNOSIS — M25652 Stiffness of left hip, not elsewhere classified: Secondary | ICD-10-CM | POA: Diagnosis not present

## 2022-08-31 DIAGNOSIS — Z96642 Presence of left artificial hip joint: Secondary | ICD-10-CM | POA: Diagnosis not present

## 2022-09-03 DIAGNOSIS — M25652 Stiffness of left hip, not elsewhere classified: Secondary | ICD-10-CM | POA: Diagnosis not present

## 2022-09-03 DIAGNOSIS — Z96642 Presence of left artificial hip joint: Secondary | ICD-10-CM | POA: Diagnosis not present

## 2022-09-05 DIAGNOSIS — G4733 Obstructive sleep apnea (adult) (pediatric): Secondary | ICD-10-CM | POA: Diagnosis not present

## 2022-09-06 DIAGNOSIS — M25652 Stiffness of left hip, not elsewhere classified: Secondary | ICD-10-CM | POA: Diagnosis not present

## 2022-09-06 DIAGNOSIS — Z96642 Presence of left artificial hip joint: Secondary | ICD-10-CM | POA: Diagnosis not present

## 2022-09-07 ENCOUNTER — Other Ambulatory Visit: Payer: Self-pay | Admitting: Internal Medicine

## 2022-09-08 DIAGNOSIS — Z96642 Presence of left artificial hip joint: Secondary | ICD-10-CM | POA: Diagnosis not present

## 2022-09-08 DIAGNOSIS — M25652 Stiffness of left hip, not elsewhere classified: Secondary | ICD-10-CM | POA: Diagnosis not present

## 2022-09-13 DIAGNOSIS — M25652 Stiffness of left hip, not elsewhere classified: Secondary | ICD-10-CM | POA: Diagnosis not present

## 2022-09-13 DIAGNOSIS — Z96642 Presence of left artificial hip joint: Secondary | ICD-10-CM | POA: Diagnosis not present

## 2022-09-15 DIAGNOSIS — Z96642 Presence of left artificial hip joint: Secondary | ICD-10-CM | POA: Diagnosis not present

## 2022-09-15 DIAGNOSIS — M25652 Stiffness of left hip, not elsewhere classified: Secondary | ICD-10-CM | POA: Diagnosis not present

## 2022-09-20 DIAGNOSIS — Z96642 Presence of left artificial hip joint: Secondary | ICD-10-CM | POA: Diagnosis not present

## 2022-09-20 DIAGNOSIS — M25652 Stiffness of left hip, not elsewhere classified: Secondary | ICD-10-CM | POA: Diagnosis not present

## 2022-09-22 DIAGNOSIS — Z96642 Presence of left artificial hip joint: Secondary | ICD-10-CM | POA: Diagnosis not present

## 2022-09-22 DIAGNOSIS — M25652 Stiffness of left hip, not elsewhere classified: Secondary | ICD-10-CM | POA: Diagnosis not present

## 2022-09-28 DIAGNOSIS — M25652 Stiffness of left hip, not elsewhere classified: Secondary | ICD-10-CM | POA: Diagnosis not present

## 2022-09-28 DIAGNOSIS — Z96642 Presence of left artificial hip joint: Secondary | ICD-10-CM | POA: Diagnosis not present

## 2022-10-05 ENCOUNTER — Other Ambulatory Visit: Payer: Self-pay

## 2022-10-06 ENCOUNTER — Ambulatory Visit (INDEPENDENT_AMBULATORY_CARE_PROVIDER_SITE_OTHER): Payer: Medicare Other | Admitting: Internal Medicine

## 2022-10-06 ENCOUNTER — Other Ambulatory Visit: Payer: Self-pay

## 2022-10-06 ENCOUNTER — Encounter: Payer: Self-pay | Admitting: Internal Medicine

## 2022-10-06 VITALS — BP 122/74 | HR 71 | Temp 98.1°F | Resp 18 | Ht 72.0 in | Wt 305.2 lb

## 2022-10-06 DIAGNOSIS — I10 Essential (primary) hypertension: Secondary | ICD-10-CM | POA: Diagnosis not present

## 2022-10-06 DIAGNOSIS — E785 Hyperlipidemia, unspecified: Secondary | ICD-10-CM | POA: Diagnosis not present

## 2022-10-06 DIAGNOSIS — R739 Hyperglycemia, unspecified: Secondary | ICD-10-CM

## 2022-10-06 DIAGNOSIS — Z0000 Encounter for general adult medical examination without abnormal findings: Secondary | ICD-10-CM | POA: Diagnosis not present

## 2022-10-06 LAB — COMPREHENSIVE METABOLIC PANEL
ALT: 15 U/L (ref 0–53)
AST: 16 U/L (ref 0–37)
Albumin: 4 g/dL (ref 3.5–5.2)
Alkaline Phosphatase: 107 U/L (ref 39–117)
BUN: 13 mg/dL (ref 6–23)
CO2: 34 mEq/L — ABNORMAL HIGH (ref 19–32)
Calcium: 9 mg/dL (ref 8.4–10.5)
Chloride: 103 mEq/L (ref 96–112)
Creatinine, Ser: 0.93 mg/dL (ref 0.40–1.50)
GFR: 81.17 mL/min (ref >60.00)
Glucose, Bld: 95 mg/dL (ref 70–99)
Potassium: 4.3 mEq/L (ref 3.5–5.1)
Sodium: 141 mEq/L (ref 135–145)
Total Bilirubin: 0.7 mg/dL (ref 0.2–1.2)
Total Protein: 6.5 g/dL (ref 6.0–8.3)

## 2022-10-06 LAB — CBC WITH DIFFERENTIAL/PLATELET
Basophils Absolute: 0 10*3/uL (ref 0.0–0.1)
Basophils Relative: 0.4 % (ref 0.0–3.0)
Eosinophils Absolute: 0.2 10*3/uL (ref 0.0–0.7)
Eosinophils Relative: 5 % (ref 0.0–5.0)
HCT: 37.5 % — ABNORMAL LOW (ref 39.0–52.0)
Hemoglobin: 12.6 g/dL — ABNORMAL LOW (ref 13.0–17.0)
Lymphocytes Relative: 31.7 % (ref 12.0–46.0)
Lymphs Abs: 1.2 10*3/uL (ref 0.7–4.0)
MCHC: 33.5 g/dL (ref 30.0–36.0)
MCV: 90.5 fl (ref 78.0–100.0)
Monocytes Absolute: 0.6 10*3/uL (ref 0.1–1.0)
Monocytes Relative: 16.2 % — ABNORMAL HIGH (ref 3.0–12.0)
Neutro Abs: 1.7 10*3/uL (ref 1.4–7.7)
Neutrophils Relative %: 46.7 % (ref 43.0–77.0)
Platelets: 223 10*3/uL (ref 150.0–400.0)
RBC: 4.15 Mil/uL — ABNORMAL LOW (ref 4.22–5.81)
RDW: 14 % (ref 11.5–15.5)
WBC: 3.7 10*3/uL — ABNORMAL LOW (ref 4.0–10.5)

## 2022-10-06 LAB — LIPID PANEL
Cholesterol: 121 mg/dL (ref 0–200)
HDL: 43.1 mg/dL (ref >39.00)
LDL Cholesterol: 70 mg/dL (ref 0–99)
NonHDL: 77.57
Total CHOL/HDL Ratio: 3
Triglycerides: 36 mg/dL (ref 0.0–149.0)
VLDL: 7.2 mg/dL (ref 0.0–40.0)

## 2022-10-06 LAB — HEMOGLOBIN A1C: Hgb A1c MFr Bld: 5.6 % (ref 4.6–6.5)

## 2022-10-06 MED ORDER — METOPROLOL SUCCINATE ER 25 MG PO TB24: 12.5000 mg | ORAL_TABLET | Freq: Every day | ORAL | 1 refills | Status: DC

## 2022-10-06 MED ORDER — ATORVASTATIN CALCIUM 10 MG PO TABS: 10.0000 mg | ORAL_TABLET | Freq: Every day | ORAL | 1 refills | Status: DC

## 2022-10-06 NOTE — Assessment & Plan Note (Signed)
-  Td 2017 - s/p zostavax and  Shingrix   -PNM 23: 2015;  prevnar 09-2016.  PNM 23 booster 09-2021 - Covid  vax 08-2022, thus UTD  - had a flu shot  -CCS: Colonoscopy:  05/08/2004, neg--no polyps   Colonoscopy: 12-2009-- tics Cscope : 05-03-11, polyp Cscope 11-2016 C-scope12/2021.  Negative, no further screenings per colonoscopy report. -Prostate ca screening:   per urology.   -Lifestyle: Discussed - Labs: CMP FLP CBC A1c - Healthcare POA: Info provided

## 2022-10-06 NOTE — Patient Instructions (Addendum)
Please read information about healthcare power of attorney    Vaccines I recommend:  RSV vaccine   GO TO THE LAB : Get the blood work     [place], [place] back for   a checkup in 6 months     Do you have a "Living will" or "[place] of attorney"? (Advance care planning documents)  If you already have a living will or healthcare power of attorney, is recommended you bring the copy to be scanned in your chart. The document will be available to all the doctors you see in the system.     Advance directives can be documented in many types of formats,  documents have names such as:  Lliving will  Durable power of attorney for healthcare (healthcare proxy or healthcare power of attorney)  Combined directives  Physician orders for life-sustaining treatment    More information at:  [email or web address]

## 2022-10-06 NOTE — Progress Notes (Signed)
Subjective:    Patient ID: Cameron [name], male    DOB: Jan 23, 1946, 76 y.o.   MRN: [id number]  DOS:  10/06/2022 Type of visit - description: CPX Here for CPX. Had a L hip replacement in October 2023.  Recuperating well. He denies chest pain or difficulty breathing Edema is better than before, he decided to continue taking potassium but stopped diuretics. GU symptoms controlled currently.   Wt Readings from Last 3 Encounters:  10/06/22 (!) 305 lb 4 oz (138.5 kg)  07/06/22 (!) 300 lb 4 oz (136.2 kg)  06/09/22 (!) 308 lb (139.7 kg)    Review of Systems  Other than above, a 14 point review of systems is negative    Past Medical History:  Diagnosis Date   Arthritis    degenerative joint disease, knees    Asthma    Constipation due to pain medication    Diverticulosis of colon    Fatty liver    GERD (gastroesophageal reflux disease)    takes Prevacid   Glaucoma (increased eye pressure)    High cholesterol    HTN (hypertension)    Iron deficiency anemia    h/o   Keloid of skin    Lumbar spondylosis    possible right sided radiculopathy with hx of surgery by Dr. Cyndy Freeze in 2003   Osteopenia    DEXA 12-2006     Personal history of colonic polyps 2002   TUBULAR ADENOMA   Prostatitis, acute 04-2011   [facility]   Sleep apnea    on CPAP, last study - [facility]   Tick bite     Past Surgical History:  Procedure Laterality Date   APPENDECTOMY  2017   BACK SURGERY  2003   CHOLECYSTECTOMY  2003   COLONOSCOPY  2018   HIP ARTHROSCOPY Left 08/26/2022   LAPAROSCOPIC APPENDECTOMY N/A 01/16/2016   Procedure: APPENDECTOMY LAPAROSCOPIC;  Surgeon: Fanny Skates, MD;  Location: [place];  Service: General;  Laterality: N/A;   LUMBAR LAMINECTOMY/DECOMPRESSION MICRODISCECTOMY N/A 08/22/2015   Procedure: Lumbar two to lumbar three lumbar three to lumbar four microdiskectomy;  Surgeon: Ashok Pall, MD;  Location: [place] [facility];  Service: Neurosurgery;  Laterality: N/A;  L23 L34  microdiskectomy   TOTAL KNEE ARTHROPLASTY Right 06/11/2014   Procedure: TOTAL KNEE ARTHROPLASTY;  Surgeon: Hessie Dibble, MD;  Location: [place];  Service: Orthopedics;  Laterality: Right;   TOTAL KNEE ARTHROPLASTY Left 10/08/2014   DR DALLDORF   TOTAL KNEE ARTHROPLASTY Left 10/08/2014   Procedure: TOTAL LEFT KNEE ARTHROPLASTY;  Surgeon: Hessie Dibble, MD;  Location: [place];  Service: Orthopedics;  Laterality: Left;   Social History   Socioeconomic History   Marital status: Married    Spouse name: Not on file   Number of children: 2   Years of education: Not on file   Highest education level: Not on file  Occupational History   Occupation: retired--works in HR  Tobacco Use   Smoking status: Never   Smokeless tobacco: Never  Vaping Use   Vaping Use: Never used  Substance and Sexual Activity   Alcohol use: Yes    Comment: most at 1/month   Drug use: No   Sexual activity: Not on file  Other Topics Concern   Not on file  Social History Narrative   Household- pt and wife   Used to plays tennis sometimes    Social Determinants of Health   Financial Resource Strain: Low Risk  (01/05/2022)  Overall Financial Resource Strain (CARDIA)    Difficulty of Paying Living Expenses: Not hard at all  Food Insecurity: No Food Insecurity (01/05/2022)   Hunger Vital Sign    Worried About Running Out of Food in the Last Year: Never true    Ran Out of Food in the Last Year: Never true  Transportation Needs: No Transportation Needs (01/05/2022)   PRAPARE - Hydrologist (Medical): No    Lack of Transportation (Non-Medical): No  Physical Activity: Insufficiently Active (01/05/2022)   Exercise Vital Sign    Days of Exercise per Week: 1 day    Minutes of Exercise per Session: 90 min  Stress: No Stress Concern Present (01/05/2022)   [place]    Feeling of Stress : Not at all  Social Connections:  [place] (01/05/2022)   Social Connection and Isolation Panel [NHANES]    Frequency of Communication with Friends and Family: More than three times a week    Frequency of Social Gatherings with Friends and Family: More than three times a week    Attends Religious Services: More than 4 times per year    Active Member of [employer] or Organizations: No    Attends Music therapist: More than 4 times per year    Marital Status: Married  Human resources officer Violence: Not At Risk (01/05/2022)   Humiliation, Afraid, Rape, and Kick questionnaire    Fear of Current or Ex-Partner: No    Emotionally Abused: No    Physically Abused: No    Sexually Abused: No    Current Outpatient Medications  Medication Instructions   albuterol (VENTOLIN HFA) 108 (90 Base) MCG/ACT inhaler 2 puffs, Inhalation, Every 6 hours PRN   atorvastatin (LIPITOR) 10 mg, Oral, Daily at bedtime   brimonidine (ALPHAGAN) 0.2 % ophthalmic solution 3 drops, Daily   folic acid (FOLVITE) 1 MG tablet TAKE 1 TABLET BY MOUTH DAILY   hydrochlorothiazide (HYDRODIURIL) 25 mg, Oral, Daily   latanoprost (XALATAN) 0.005 % ophthalmic solution 1 drop, Daily at bedtime   metoprolol succinate (TOPROL-XL) 12.5 mg, Oral, Daily   Myrbetriq 50 mg, Daily   potassium chloride (KLOR-CON M) 10 MEQ tablet 10 mEq, Oral, Daily   sildenafil (VIAGRA) 100 MG tablet Take 1/2 to 1 tablet as needed.  Do not use if taking nitrates for heart disease.   vitamin C 1,000 mg, Oral, 2 times daily       Objective:   Physical Exam BP 122/74   Pulse 71   Temp 98.1 F (36.7 C) (Oral)   Resp 18   Ht 6' (1.829 m)   Wt (!) 305 lb 4 oz (138.5 kg)   SpO2 97%   BMI 41.40 kg/m  General: Well developed, NAD, BMI noted Neck: No  thyromegaly  HEENT:  Normocephalic . Face symmetric, atraumatic Lungs:  CTA B Normal respiratory effort, no intercostal retractions, no accessory muscle use. Heart: RRR,  no murmur.  Abdomen:  Not distended, soft,  non-tender. No rebound or rigidity.   Lower extremities: Trace pretibial edema bilaterally  Skin: Exposed areas without rash. Not pale. Not jaundice Neurologic:  alert & oriented X3.  Speech normal, gait appropriate for age and unassisted Strength symmetric and appropriate for age.  Psych: Cognition and judgment appear intact.  Cooperative with normal attention span and concentration.  Behavior appropriate. No anxious or depressed appearing.     Assessment     Assessment Hyperglycemia Hypertension High  cholesterol OSA on CPAP GI: GERD, Fatty liver Asthma, previously RAD (occ wheezing w/ URIs, inhalers) DJD: lumbar spondylosis, back surgery 2003, knee replacements Morbid obesity  GU: --LUTS  Dr Venia Minks ([facility]) --h/o prostatitis admitted 2012 [facility] Glaucoma Osteopenia per DEXA 2008, multiple attempts to redo bone density test failed.  h/o iron deficiency  H/o  B12 and vit D def, 2016 levels wnl  PLAN: Here for CPX Hyperglycemia: Explained patient he has hyperglycemia, management is stay active and eat healthy.  Check A1c HTN: Seems controlled, continue metoprolol.  See next Edema: Was recommended HCTZ and KCl as needed for edema, he stopped HCTZ but continue with potassium supplements because it helped with cramps.  Check BMP. High cholesterol, on atorvastatin, checking labs DJD: Had a successful L hip replacement few weeks ago.  Seems to be recuperating well. Morbid obesity: Has gained weight back, encouraged healthy lifestyle, was prescribed Wegovy, was not covered by insurance.  Will reassess in 6 months.  Reconsider medications. RTC 6 months

## 2022-10-06 NOTE — Assessment & Plan Note (Signed)
Here for CPX Hyperglycemia: Explained patient he has hyperglycemia, management is stay active and eat healthy.  Check A1c HTN: Seems controlled, continue metoprolol.  See next Edema: Was recommended HCTZ and KCl as needed for edema, he stopped HCTZ but continue with potassium supplements because it helped with cramps.  Check BMP. High cholesterol, on atorvastatin, checking labs DJD: Had a successful L hip replacement few weeks ago.  Seems to be recuperating well. Morbid obesity: Has gained weight back, encouraged healthy lifestyle, was prescribed Wegovy, was not covered by insurance.  Will reassess in 6 months.  Reconsider medications. RTC 6 months

## 2022-10-07 ENCOUNTER — Other Ambulatory Visit: Payer: Self-pay

## 2022-10-07 ENCOUNTER — Other Ambulatory Visit (INDEPENDENT_AMBULATORY_CARE_PROVIDER_SITE_OTHER): Payer: Medicare Other

## 2022-10-07 DIAGNOSIS — D649 Anemia, unspecified: Secondary | ICD-10-CM

## 2022-10-07 LAB — IBC + FERRITIN
Ferritin: 44 ng/mL (ref 22.0–322.0)
Iron: 59 ug/dL (ref 42–165)
Saturation Ratios: 19.7 % — ABNORMAL LOW (ref 20.0–50.0)
TIBC: 299.6 ug/dL (ref 250.0–450.0)
Transferrin: 214 mg/dL (ref 212.0–360.0)

## 2022-10-11 DIAGNOSIS — H02834 Dermatochalasis of left upper eyelid: Secondary | ICD-10-CM | POA: Diagnosis not present

## 2022-10-11 DIAGNOSIS — H2513 Age-related nuclear cataract, bilateral: Secondary | ICD-10-CM | POA: Diagnosis not present

## 2022-10-11 DIAGNOSIS — H401131 Primary open-angle glaucoma, bilateral, mild stage: Secondary | ICD-10-CM | POA: Diagnosis not present

## 2022-10-11 DIAGNOSIS — H04123 Dry eye syndrome of bilateral lacrimal glands: Secondary | ICD-10-CM | POA: Diagnosis not present

## 2022-10-11 DIAGNOSIS — H35033 Hypertensive retinopathy, bilateral: Secondary | ICD-10-CM | POA: Diagnosis not present

## 2022-11-10 DIAGNOSIS — M542 Cervicalgia: Secondary | ICD-10-CM | POA: Diagnosis not present

## 2022-11-10 DIAGNOSIS — M25511 Pain in right shoulder: Secondary | ICD-10-CM | POA: Diagnosis not present

## 2022-11-16 DIAGNOSIS — M542 Cervicalgia: Secondary | ICD-10-CM | POA: Diagnosis not present

## 2022-11-16 DIAGNOSIS — R293 Abnormal posture: Secondary | ICD-10-CM | POA: Diagnosis not present

## 2022-11-19 DIAGNOSIS — R293 Abnormal posture: Secondary | ICD-10-CM | POA: Diagnosis not present

## 2022-11-19 DIAGNOSIS — M542 Cervicalgia: Secondary | ICD-10-CM | POA: Diagnosis not present

## 2022-11-26 DIAGNOSIS — R293 Abnormal posture: Secondary | ICD-10-CM | POA: Diagnosis not present

## 2022-11-26 DIAGNOSIS — Z96642 Presence of left artificial hip joint: Secondary | ICD-10-CM | POA: Diagnosis not present

## 2022-11-26 DIAGNOSIS — M542 Cervicalgia: Secondary | ICD-10-CM | POA: Diagnosis not present

## 2022-12-06 DIAGNOSIS — G4733 Obstructive sleep apnea (adult) (pediatric): Secondary | ICD-10-CM | POA: Diagnosis not present

## 2022-12-13 ENCOUNTER — Other Ambulatory Visit: Payer: Self-pay | Admitting: Internal Medicine

## 2022-12-14 ENCOUNTER — Other Ambulatory Visit: Payer: Self-pay | Admitting: Internal Medicine

## 2023-02-07 ENCOUNTER — Ambulatory Visit (INDEPENDENT_AMBULATORY_CARE_PROVIDER_SITE_OTHER): Payer: Medicare Other | Admitting: *Deleted

## 2023-02-07 VITALS — BP 136/80 | HR 70 | Ht 72.0 in | Wt 308.8 lb

## 2023-02-07 DIAGNOSIS — Z0000 Encounter for general adult medical examination without abnormal findings: Secondary | ICD-10-CM

## 2023-02-07 NOTE — Progress Notes (Signed)
Subjective:  Pt completed ADLs, Fall risk, and SDOH during e-check in on 02/02/23.  Answers verified with pt.    Cameron [name] is a 77 y.o. male who presents for Medicare Annual/Subsequent preventive examination.  Review of Systems     Cardiac Risk Factors include: advanced age (>27men, >51 women);dyslipidemia;hypertension;male gender;obesity (BMI >30kg/m2)     Objective:    Today's Vitals   02/07/23 1300  BP: 136/80  Pulse: 70  Weight: (!) 308 lb 12.8 oz (140.1 kg)  Height: 6' (1.829 m)   Body mass index is 41.88 kg/m.     02/07/2023    1:01 PM 01/05/2022    9:43 AM 11/02/2021    2:56 PM 04/28/2020   11:32 AM 10/16/2019    8:13 AM 12/05/2017    7:27 AM 10/30/2017    5:25 PM  Advanced Directives  Does Patient Have a Medical Advance Directive? No No No Yes No No No  Does patient want to make changes to medical advance directive?     No - Patient declined    Would patient like information on creating a medical advance directive? No - Patient declined No - Patient declined No - Patient declined   No - Patient declined     Current Medications (verified) Outpatient Encounter Medications as of 02/07/2023  Medication Sig   albuterol (VENTOLIN HFA) 108 (90 Base) MCG/ACT inhaler Inhale 2 puffs into the lungs every 6 (six) hours as needed for wheezing or shortness of breath. (Patient not taking: Reported on 06/09/2022)   Ascorbic Acid (VITAMIN C) 1000 MG tablet Take 1,000 mg by mouth 2 (two) times daily.    atorvastatin (LIPITOR) 10 MG tablet Take 1 tablet (10 mg total) by mouth at bedtime.   brimonidine (ALPHAGAN) 0.2 % ophthalmic solution 3 drops daily.    folic acid (FOLVITE) 1 MG tablet Take 1 tablet (1 mg total) by mouth daily.   hydrochlorothiazide (HYDRODIURIL) 25 MG tablet Take 1 tablet (25 mg total) by mouth daily.   latanoprost (XALATAN) 0.005 % ophthalmic solution Place 1 drop into both eyes at bedtime.    metoprolol succinate (TOPROL-XL) 25 MG 24 hr tablet Take 0.5  tablets (12.5 mg total) by mouth daily.   MYRBETRIQ 50 MG TB24 tablet Take 50 mg by mouth daily.    potassium chloride (KLOR-CON M) 10 MEQ tablet Take 1 tablet (10 mEq total) by mouth daily.   sildenafil (VIAGRA) 100 MG tablet Take 1/2 to 1 tablet as needed.  Do not use if taking nitrates for heart disease. (Patient not taking: Reported on 10/06/2022)   No facility-administered encounter medications on file as of 02/07/2023.    Allergies (verified) Chlorhexidine gluconate, Ciprofloxacin hcl, and Clindamycin   History: Past Medical History:  Diagnosis Date   Allergy 2013   Arthritis    degenerative joint disease, knees    Asthma    Constipation due to pain medication    Diverticulosis of colon    Fatty liver    GERD (gastroesophageal reflux disease)    takes Prevacid   Glaucoma (increased eye pressure)    High cholesterol    HTN (hypertension)    Iron deficiency anemia    h/o   Keloid of skin    Lumbar spondylosis    possible right sided radiculopathy with hx of surgery by Dr. Cyndy Freeze in 2003   Osteopenia    DEXA 12-2006     Personal history of colonic polyps 2002   TUBULAR ADENOMA   Prostatitis,  acute 04/2011   admitted to [facility]   Sleep apnea    on CPAP, last study - [facility]   Tick bite    Past Surgical History:  Procedure Laterality Date   APPENDECTOMY  2017   BACK SURGERY  2003   CHOLECYSTECTOMY  2003   COLONOSCOPY  2018   HIP ARTHROSCOPY Left 08/26/2022   JOINT REPLACEMENT  06/2014. 10/2014. 08/2022   Both knees. Left hip   LAPAROSCOPIC APPENDECTOMY N/A 01/16/2016   Procedure: APPENDECTOMY LAPAROSCOPIC;  Surgeon: Fanny Skates, MD;  Location: [place];  Service: General;  Laterality: N/A;   LUMBAR LAMINECTOMY/DECOMPRESSION MICRODISCECTOMY N/A 08/22/2015   Procedure: Lumbar two to lumbar three lumbar three to lumbar four microdiskectomy;  Surgeon: Ashok Pall, MD;  Location: [place] [facility];  Service: Neurosurgery;  Laterality: N/A;  L23 L34 microdiskectomy   SPINE SURGERY   02/2002. 07/2015   TOTAL KNEE ARTHROPLASTY Right 06/11/2014   Procedure: TOTAL KNEE ARTHROPLASTY;  Surgeon: Hessie Dibble, MD;  Location: [place];  Service: Orthopedics;  Laterality: Right;   TOTAL KNEE ARTHROPLASTY Left 10/08/2014   DR DALLDORF   TOTAL KNEE ARTHROPLASTY Left 10/08/2014   Procedure: TOTAL LEFT KNEE ARTHROPLASTY;  Surgeon: Hessie Dibble, MD;  Location: [place];  Service: Orthopedics;  Laterality: Left;   Family History  Problem Relation Age of Onset   Dementia Mother    Diverticulitis Father    Colon cancer Maternal Uncle        dx age 63s   Colon cancer Other        grandfather, dx age 63   Stroke Other        maternal aunt in her 83s   Breast cancer Maternal Aunt    Prostate cancer Neg Hx    Heart attack Neg Hx    Hypertension Neg Hx    Diabetes Neg Hx    Rectal cancer Neg Hx    Stomach cancer Neg Hx    Social History   Socioeconomic History   Marital status: Married    Spouse name: Not on file   Number of children: 2   Years of education: Not on file   Highest education level: Not on file  Occupational History   Occupation: retired--works in HR  Tobacco Use   Smoking status: Never   Smokeless tobacco: Never  Vaping Use   Vaping Use: Never used  Substance and Sexual Activity   Alcohol use: Yes    Comment: most at 1/month   Drug use: No   Sexual activity: Not on file  Other Topics Concern   Not on file  Social History Narrative   Household- pt and wife   Used to plays tennis sometimes    Social Determinants of Health   Financial Resource Strain: Low Risk  (02/02/2023)   Overall Financial Resource Strain (CARDIA)    Difficulty of Paying Living Expenses: Not hard at all  Food Insecurity: No Food Insecurity (02/02/2023)   Hunger Vital Sign    Worried About Running Out of Food in the Last Year: Never true    Ran Out of Food in the Last Year: Never true  Transportation Needs: No Transportation Needs (02/02/2023)   PRAPARE - Armed forces logistics/support/administrative officer (Medical): No    Lack of Transportation (Non-Medical): No  Physical Activity: Insufficiently Active (02/02/2023)   Exercise Vital Sign    Days of Exercise per Week: 2 days    Minutes of Exercise per Session: 30 min  Stress: No Stress Concern Present (02/02/2023)   [place]    Feeling of Stress : Not at all  Social Connections: Unknown (02/02/2023)   Social Connection and Isolation Panel [NHANES]    Frequency of Communication with Friends and Family: Once a week    Frequency of Social Gatherings with Friends and Family: Once a week    Attends Religious Services: Not on Advertising copywriter or Organizations: Yes    Attends Music therapist: More than 4 times per year    Marital Status: Married    Tobacco Counseling Counseling given: Not Answered   Clinical Intake:  Pre-visit preparation completed: Yes  Pain : No/denies pain  Nutritional Risks: None Diabetes: No  How often do you need to have someone help you when you read instructions, pamphlets, or other written materials from your doctor or pharmacy?: 1 - Never   Activities of Daily Living    02/02/2023   10:39 AM  In your present state of health, do you have any difficulty performing the following activities:  Hearing? 0  Vision? 0  Difficulty concentrating or making decisions? 0  Walking or climbing stairs? 0  Dressing or bathing? 0  Doing errands, shopping? 0  Preparing Food and eating ? N  Using the Toilet? N  In the past six months, have you accidently leaked urine? Y  Do you have problems with loss of bowel control? N  Managing your Medications? N  Managing your Finances? N  Housekeeping or managing your Housekeeping? N    Patient Care Team: Colon Branch, MD as PCP - General [place]rose Nakayama, MD as Consulting Physician (Orthopedic Surgery) Ulyses Southward., MD (Urology) Rigoberto Noel, MD as  Consulting Physician (Pulmonary Disease)  Indicate any recent Medical Services you may have received from other than [facility] providers in the past year (date may be approximate).     Assessment:   This is a routine wellness examination for Delrae Herndon.  Hearing/Vision screen No results found.  Dietary issues and exercise activities discussed: Current Exercise Habits: Home exercise routine, Type of exercise: calisthenics;Other - see comments (stationary bike), Time (Minutes): 40, Frequency (Times/Week): 2, Weekly Exercise (Minutes/Week): 80, Intensity: Mild, Exercise limited by: None identified   Goals Addressed   None    Depression Screen    02/07/2023    1:03 PM 10/06/2022   10:07 AM 06/09/2022    9:39 AM 01/05/2022    9:46 AM 10/05/2021    8:58 AM 04/21/2021    4:21 PM 10/16/2019    8:18 AM  PHQ 2/9 Scores  PHQ - 2 Score 0 0 0 0 0 0 0    Fall Risk    02/02/2023   10:39 AM 10/06/2022   10:07 AM 06/09/2022    9:39 AM 01/05/2022    9:44 AM 10/05/2021    8:58 AM  Fall Risk   Falls in the past year? 0 0 0 1 1  Number falls in past yr: 0 0 0 0 0  Injury with Fall? 0 0 0 0 0  Risk for fall due to : No Fall Risks      Follow up Falls evaluation completed Falls evaluation completed Falls evaluation completed Falls prevention discussed Falls evaluation completed    [name]:  Any stairs in or around the home? Yes  If so, are there any without handrails? No  Home free  of loose throw rugs in walkways, pet beds, electrical cords, etc? Yes  Adequate lighting in your home to reduce risk of falls? Yes   ASSISTIVE DEVICES UTILIZED TO PREVENT FALLS:  Life alert? No  Use of a cane, walker or w/c? No  Grab bars in the bathroom? No  Shower chair or bench in shower?  Built in seat Elevated toilet seat or a handicapped toilet?  Comfort height  TIMED UP AND GO:  Was the test performed? Yes .  Length of time to ambulate 10 feet: 6 sec.   Gait steady and  fast without use of assistive device  Cognitive Function:        02/07/2023    1:08 PM  6CIT Screen  What Year? 0 points  What month? 0 points  What time? 0 points  Count back from 20 4 points  Months in reverse 0 points  Repeat phrase 0 points  Total Score 4 points    Immunizations Immunization History  Administered Date(s) Administered   Fluad Quad(high Dose 65+) 07/24/2019   Influenza Split 07/09/2014, 09/08/2021, 08/08/2022   Influenza Whole 08/09/2008, 07/10/2011   Influenza, High Dose Seasonal PF 09/19/2018   Influenza,inj,Quad PF,6+ Mos 07/08/2016   Influenza-Unspecified 08/30/2017, 08/08/2020   PFIZER(Purple Top)SARS-COV-2 Vaccination 11/27/2019, 12/19/2019, 08/06/2020, 05/31/2021   Pfizer Covid-19 Vaccine Bivalent Booster 32yrs & up 10/13/2021, 09/14/2022   Pneumococcal Conjugate-13 09/10/2016   Pneumococcal Polysaccharide-23 07/23/2014, 10/05/2021   RSV,unspecified 10/24/2022   Td 06/08/2006, 09/10/2016   Zoster Recombinat (Shingrix) 07/03/2021, 09/08/2021   Zoster, Live 04/06/2013    TDAP status: Up to date  Flu Vaccine status: Up to date  Pneumococcal vaccine status: Up to date  Covid-19 vaccine status: Information provided on how to obtain vaccines.   Qualifies for Shingles Vaccine? Yes   Zostavax completed Yes   Shingrix Completed?: Yes  Screening Tests Health Maintenance  Topic Date Due   COVID-19 Vaccine (7 - 2023-24 season) 11/09/2022   Medicare Annual Wellness (AWV)  01/05/2023   INFLUENZA VACCINE  06/09/2023   DTaP/Tdap/Td (3 - Tdap) 09/10/2026   Pneumonia Vaccine 87+ Years old  Completed   Hepatitis C Screening  Completed   Zoster Vaccines- Shingrix  Completed   HPV VACCINES  Aged Out   COLONOSCOPY (Pts 45-35yrs Insurance coverage will need to be confirmed)  [place] Maintenance Due  Topic Date Due   COVID-19 Vaccine (7 - 2023-24 season) 11/09/2022   Medicare Annual Wellness (AWV)  01/05/2023     Colorectal cancer screening: Type of screening: Colonoscopy. Completed 10/13/20. Repeat every N/a years  Lung Cancer Screening: (Low Dose CT Chest recommended if Age 70-80 years, 30 pack-year currently smoking OR have quit w/in 15years.) does not qualify.   Additional Screening:  Hepatitis C Screening: does qualify; Completed 04/16/11  Vision Screening: Recommended annual ophthalmology exams for early detection of glaucoma and other disorders of the eye. Is the patient up to date with their annual eye exam?  Yes  Who is the provider or what is the name of the office in which the patient attends annual eye exams? [facility] If pt is not established with a provider, would they like to be referred to a provider to establish care? No .   Dental Screening: Recommended annual dental exams for proper oral hygiene  Community Resource Referral / Chronic Care Management: CRR required this visit?  No   CCM required this visit?  No  Plan:     I have personally reviewed and noted the following in the patient's chart:   Medical and social history Use of alcohol, tobacco or illicit drugs  Current medications and supplements including opioid prescriptions. Patient is not currently taking opioid prescriptions. Functional ability and status Nutritional status Physical activity Advanced directives List of other physicians Hospitalizations, surgeries, and ER visits in previous 12 months Vitals Screenings to include cognitive, depression, and falls Referrals and appointments  In addition, I have reviewed and discussed with patient certain preventive protocols, quality metrics, and best practice recommendations. A written personalized care plan for preventive services as well as general preventive health recommendations were provided to patient.     Beatris Ship, [place]   02/07/2023   Nurse Notes: None

## 2023-02-07 NOTE — Patient Instructions (Signed)
[name] , Thank you for taking time to come for your Medicare Wellness Visit. I appreciate your ongoing commitment to your health goals. Please review the following plan we discussed and let me know if I can assist you in the future.   These are the goals we discussed:  Goals      Patient Stated     Maintain healthy lifestyle.        This is a list of the screening recommended for you and due dates:  Health Maintenance  Topic Date Due   COVID-19 Vaccine (7 - 2023-24 season) 11/09/2022   Flu Shot  06/09/2023   Medicare Annual Wellness Visit  02/07/2024   DTaP/Tdap/Td vaccine (3 - Tdap) 09/10/2026   Pneumonia Vaccine  Completed   Hepatitis C Screening: USPSTF Recommendation to screen - Ages 65-79 yo.  Completed   Zoster (Shingles) Vaccine  Completed   HPV Vaccine  Aged Out   Colon Cancer Screening  Discontinued     Next appointment: Follow up in one year for your annual wellness visit.   Preventive Care 21 Years and Older, Male Preventive care refers to lifestyle choices and visits with your health care provider that can promote health and wellness. What does preventive care include? A yearly physical exam. This is also called an annual well check. Dental exams once or twice a year. Routine eye exams. Ask your health care provider how often you should have your eyes checked. Personal lifestyle choices, including: Daily care of your teeth and gums. Regular physical activity. Eating a healthy diet. Avoiding tobacco and drug use. Limiting alcohol use. Practicing safe sex. Taking low doses of aspirin every day. Taking vitamin and mineral supplements as recommended by your health care provider. What happens during an annual well check? The services and screenings done by your health care provider during your annual well check will depend on your age, overall health, lifestyle risk factors, and family history of disease. Counseling  Your health care provider may ask you  questions about your: Alcohol use. Tobacco use. Drug use. Emotional well-being. Home and relationship well-being. Sexual activity. Eating habits. History of falls. Memory and ability to understand (cognition). Work and work Statistician. Screening  You may have the following tests or measurements: Height, weight, and BMI. Blood pressure. Lipid and cholesterol levels. These may be checked every 5 years, or more frequently if you are over 33 years old. Skin check. Lung cancer screening. You may have this screening every year starting at age 17 if you have a 30-pack-year history of smoking and currently smoke or have quit within the past 15 years. Fecal occult blood test (FOBT) of the stool. You may have this test every year starting at age 8. Flexible sigmoidoscopy or colonoscopy. You may have a sigmoidoscopy every 5 years or a colonoscopy every 10 years starting at age 16. Prostate cancer screening. Recommendations will vary depending on your family history and other risks. Hepatitis C blood test. Hepatitis B blood test. Sexually transmitted disease (STD) testing. Diabetes screening. This is done by checking your blood sugar (glucose) after you have not eaten for a while (fasting). You may have this done every 1-3 years. Abdominal aortic aneurysm (AAA) screening. You may need this if you are a current or former smoker. Osteoporosis. You may be screened starting at age 17 if you are at high risk. Talk with your health care provider about your test results, treatment options, and if necessary, the need for more tests. Vaccines  Your health care provider may recommend certain vaccines, such as: Influenza vaccine. This is recommended every year. Tetanus, diphtheria, and acellular pertussis (Tdap, Td) vaccine. You may need a Td booster every 10 years. Zoster vaccine. You may need this after age 33. Pneumococcal 13-valent conjugate (PCV13) vaccine. One dose is recommended after age  70. Pneumococcal polysaccharide (PPSV23) vaccine. One dose is recommended after age 43. Talk to your health care provider about which screenings and vaccines you need and how often you need them. This information is not intended to replace advice given to you by your health care provider. Make sure you discuss any questions you have with your health care provider. Document Released: 11/21/2015 Document Revised: 07/14/2016 Document Reviewed: 08/26/2015 Elsevier Interactive Patient Education  2017 [employer] Prevention in the Home Falls can cause injuries. They can happen to people of all ages. There are many things you can do to make your home safe and to help prevent falls. What can I do on the outside of my home? Regularly fix the edges of walkways and driveways and fix any cracks. Remove anything that might make you trip as you walk through a door, such as a raised step or threshold. Trim any bushes or trees on the path to your home. Use bright outdoor lighting. Clear any walking paths of anything that might make someone trip, such as rocks or tools. Regularly check to see if handrails are loose or broken. Make sure that both sides of any steps have handrails. Any raised decks and porches should have guardrails on the edges. Have any leaves, snow, or ice cleared regularly. Use sand or salt on walking paths during winter. Clean up any spills in your garage right away. This includes oil or grease spills. What can I do in the bathroom? Use night lights. Install grab bars by the toilet and in the tub and shower. Do not use towel bars as grab bars. Use non-skid mats or decals in the tub or shower. If you need to sit down in the shower, use a plastic, non-slip stool. Keep the floor dry. Clean up any water that spills on the floor as soon as it happens. Remove soap buildup in the tub or shower regularly. Attach bath mats securely with double-sided non-slip rug tape. Do not have throw  rugs and other things on the floor that can make you trip. What can I do in the bedroom? Use night lights. Make sure that you have a light by your bed that is easy to reach. Do not use any sheets or blankets that are too big for your bed. They should not hang down onto the floor. Have a firm chair that has side arms. You can use this for support while you get dressed. Do not have throw rugs and other things on the floor that can make you trip. What can I do in the kitchen? Clean up any spills right away. Avoid walking on wet floors. Keep items that you use a lot in easy-to-reach places. If you need to reach something above you, use a strong step stool that has a grab bar. Keep electrical cords out of the way. Do not use floor polish or wax that makes floors slippery. If you must use wax, use non-skid floor wax. Do not have throw rugs and other things on the floor that can make you trip. What can I do with my stairs? Do not leave any items on the stairs. Make sure that there are  handrails on both sides of the stairs and use them. Fix handrails that are broken or loose. Make sure that handrails are as long as the stairways. Check any carpeting to make sure that it is firmly attached to the stairs. Fix any carpet that is loose or worn. Avoid having throw rugs at the top or bottom of the stairs. If you do have throw rugs, attach them to the floor with carpet tape. Make sure that you have a light switch at the top of the stairs and the bottom of the stairs. If you do not have them, ask someone to add them for you. What else can I do to help prevent falls? Wear shoes that: Do not have high heels. Have rubber bottoms. Are comfortable and fit you well. Are closed at the toe. Do not wear sandals. If you use a stepladder: Make sure that it is fully opened. Do not climb a closed stepladder. Make sure that both sides of the stepladder are locked into place. Ask someone to hold it for you, if  possible. Clearly mark and make sure that you can see: Any grab bars or handrails. First and last steps. Where the edge of each step is. Use tools that help you move around (mobility aids) if they are needed. These include: Canes. Walkers. Scooters. Crutches. Turn on the lights when you go into a dark area. Replace any light bulbs as soon as they burn out. Set up your furniture so you have a clear path. Avoid moving your furniture around. If any of your floors are uneven, fix them. If there are any pets around you, be aware of where they are. Review your medicines with your doctor. Some medicines can make you feel dizzy. This can increase your chance of falling. Ask your doctor what other things that you can do to help prevent falls. This information is not intended to replace advice given to you by your health care provider. Make sure you discuss any questions you have with your health care provider. Document Released: 08/21/2009 Document Revised: 04/01/2016 Document Reviewed: 11/29/2014 Elsevier Interactive Patient Education  2017 [employer].

## 2023-03-07 DIAGNOSIS — G4733 Obstructive sleep apnea (adult) (pediatric): Secondary | ICD-10-CM | POA: Diagnosis not present

## 2023-04-06 ENCOUNTER — Encounter: Payer: Self-pay | Admitting: Internal Medicine

## 2023-04-06 ENCOUNTER — Ambulatory Visit (INDEPENDENT_AMBULATORY_CARE_PROVIDER_SITE_OTHER): Payer: Medicare Other | Admitting: Internal Medicine

## 2023-04-06 VITALS — BP 138/78 | HR 69 | Temp 98.0°F | Resp 18 | Ht 72.0 in | Wt 310.5 lb

## 2023-04-06 DIAGNOSIS — I10 Essential (primary) hypertension: Secondary | ICD-10-CM

## 2023-04-06 LAB — BASIC METABOLIC PANEL
BUN: 14 mg/dL (ref 6–23)
CO2: 31 mEq/L (ref 19–32)
Calcium: 9 mg/dL (ref 8.4–10.5)
Chloride: 104 mEq/L (ref 96–112)
Creatinine, Ser: 0.99 mg/dL (ref 0.40–1.50)
GFR: 75.04 mL/min (ref >60.00)
Glucose, Bld: 95 mg/dL (ref 70–99)
Potassium: 3.9 mEq/L (ref 3.5–5.1)
Sodium: 142 mEq/L (ref 135–145)

## 2023-04-06 MED ORDER — ALBUTEROL SULFATE HFA 108 (90 BASE) MCG/ACT IN AERS: 2.0000 | INHALATION_SPRAY | Freq: Four times a day (QID) | RESPIRATORY_TRACT | 5 refills | Status: DC | PRN

## 2023-04-06 NOTE — Progress Notes (Signed)
Subjective:    Patient ID: Cameron [name], male    DOB: Jan 23, 1946, 77 y.o.   MRN: [id number]  DOS:  04/06/2023 Type of visit - description: Routine checkup  Today we review his chronic medical problems. In general feels well.  Wt Readings from Last 3 Encounters:  04/06/23 (!) 310 lb 8 oz (140.8 kg)  02/07/23 (!) 308 lb 12.8 oz (140.1 kg)  10/06/22 (!) 305 lb 4 oz (138.5 kg)     Review of Systems See above   Past Medical History:  Diagnosis Date   Allergy 2013   Arthritis    degenerative joint disease, knees    Asthma    Constipation due to pain medication    Diverticulosis of colon    Fatty liver    GERD (gastroesophageal reflux disease)    takes Prevacid   Glaucoma (increased eye pressure)    High cholesterol    HTN (hypertension)    Iron deficiency anemia    h/o   Keloid of skin    Lumbar spondylosis    possible right sided radiculopathy with hx of surgery by Dr. Mikal Plane in 2003   Osteopenia    DEXA 12-2006     Personal history of colonic polyps 2002   TUBULAR ADENOMA   Prostatitis, acute 04/2011   admitted to [facility]   Sleep apnea    on CPAP, last study - [facility]   Tick bite     Past Surgical History:  Procedure Laterality Date   APPENDECTOMY  2017   BACK SURGERY  2003   CHOLECYSTECTOMY  2003   COLONOSCOPY  2018   HIP ARTHROSCOPY Left 08/26/2022   JOINT REPLACEMENT  06/2014. 10/2014. 08/2022   Both knees. Left hip   LAPAROSCOPIC APPENDECTOMY N/A 01/16/2016   Procedure: APPENDECTOMY LAPAROSCOPIC;  Surgeon: Claud Kelp, MD;  Location: [facility] OR;  Service: General;  Laterality: N/A;   LUMBAR LAMINECTOMY/DECOMPRESSION MICRODISCECTOMY N/A 08/22/2015   Procedure: Lumbar two to lumbar three lumbar three to lumbar four microdiskectomy;  Surgeon: Coletta Memos, MD;  Location: [facility];  Service: Neurosurgery;  Laterality: N/A;  L23 L34 microdiskectomy   SPINE SURGERY  02/2002. 07/2015   TOTAL KNEE ARTHROPLASTY Right 06/11/2014   Procedure: TOTAL KNEE  ARTHROPLASTY;  Surgeon: Velna Ochs, MD;  Location: [facility] OR;  Service: Orthopedics;  Laterality: Right;   TOTAL KNEE ARTHROPLASTY Left 10/08/2014   DR DALLDORF   TOTAL KNEE ARTHROPLASTY Left 10/08/2014   Procedure: TOTAL LEFT KNEE ARTHROPLASTY;  Surgeon: Velna Ochs, MD;  Location: [facility] OR;  Service: Orthopedics;  Laterality: Left;    Current Outpatient Medications  Medication Instructions   albuterol (VENTOLIN HFA) 108 (90 Base) MCG/ACT inhaler 2 puffs, Inhalation, Every 6 hours PRN   atorvastatin (LIPITOR) 10 mg, Oral, Daily at bedtime   brimonidine (ALPHAGAN) 0.2 % ophthalmic solution 3 drops, Daily   folic acid (FOLVITE) 1 mg, Oral, Daily   hydrochlorothiazide (HYDRODIURIL) 25 mg, Oral, Daily   latanoprost (XALATAN) 0.005 % ophthalmic solution 1 drop, Daily at bedtime   metoprolol succinate (TOPROL-XL) 12.5 mg, Oral, Daily   Myrbetriq 50 mg, Daily   potassium chloride (KLOR-CON M) 10 MEQ tablet 10 mEq, Oral, Daily   sildenafil (VIAGRA) 100 MG tablet Take 1/2 to 1 tablet as needed.  Do not use if taking nitrates for heart disease.   vitamin C 1,000 mg, Oral, 2 times daily       Objective:   Physical Exam BP 138/78   Pulse 69  Temp 98 F (36.7 C) (Oral)   Resp 18   Ht 6' (1.829 m)   Wt (!) 310 lb 8 oz (140.8 kg)   SpO2 95%   BMI 42.11 kg/m  General:   Well developed, NAD, BMI noted. HEENT:  Normocephalic . Face symmetric, atraumatic Lungs:  CTA B Normal respiratory effort, no intercostal retractions, no accessory muscle use. Heart: RRR,  no murmur.  Lower extremities: trace  pretibial edema bilaterally  Skin: Not pale. Not jaundice Neurologic:  alert & oriented X3.  Speech normal, gait appropriate for age and unassisted Psych--  Cognition and judgment appear intact.  Cooperative with normal attention span and concentration.  Behavior appropriate. No anxious or depressed appearing.      Assessment    Assessment Hyperglycemia Hypertension High  cholesterol OSA on CPAP GI: GERD, Fatty liver Asthma, previously RAD (occ wheezing w/ URIs, inhalers) DJD: lumbar spondylosis, back surgery 2003, knee replacements Morbid obesity  GU: --LUTS  Dr Sabino Gasser ([facility]) --h/o prostatitis admitted 2012 [facility] Glaucoma Osteopenia per DEXA 2008, multiple attempts to redo bone density test failed.  h/o iron deficiency  H/o  B12 and vit D def, 2016 levels wnl  PLAN: Hyperglycemia: Last A1c satisfactory.  No change HTN: Reports normal ambulatory BPs, on metoprolol.  takes HCTZ/K+ as needed for swelling.  Check BMP. Anemia: Mild anemia noted during LOV, iron and ferritin normal.  Reassess on RTC High cholesterol: On Lipitor, controlled. Asthma: Well-controlled, uses albuterol infrequently, RF sent. RTC CPX November 2024

## 2023-04-06 NOTE — Patient Instructions (Signed)
Continue checking your blood pressures BP GOAL is between 110/65 and  135/85. If it is consistently higher or lower, let me know    GO TO THE LAB : Get the blood work     GO TO THE FRONT DESK, PLEASE SCHEDULE YOUR APPOINTMENTS Come back for a physical exam by November 2024

## 2023-04-08 NOTE — Assessment & Plan Note (Signed)
Hyperglycemia: Last A1c satisfactory.  No change HTN: Reports normal ambulatory BPs, on metoprolol.  takes HCTZ/K+ as needed for swelling.  Check BMP. Anemia: Mild anemia noted during LOV, iron and ferritin normal.  Reassess on RTC High cholesterol: On Lipitor, controlled. Asthma: Well-controlled, uses albuterol infrequently, RF sent. RTC CPX November 2024

## 2023-07-12 ENCOUNTER — Other Ambulatory Visit: Payer: Self-pay | Admitting: Internal Medicine

## 2023-08-22 DIAGNOSIS — M25552 Pain in left hip: Secondary | ICD-10-CM | POA: Diagnosis not present

## 2023-09-11 ENCOUNTER — Other Ambulatory Visit: Payer: Self-pay | Admitting: Internal Medicine

## 2023-10-10 ENCOUNTER — Other Ambulatory Visit: Payer: Self-pay | Admitting: Internal Medicine

## 2023-10-11 ENCOUNTER — Encounter: Payer: Self-pay | Admitting: Internal Medicine

## 2023-10-11 ENCOUNTER — Ambulatory Visit (INDEPENDENT_AMBULATORY_CARE_PROVIDER_SITE_OTHER): Payer: Medicare Other | Admitting: Internal Medicine

## 2023-10-11 VITALS — BP 134/86 | HR 73 | Temp 98.2°F | Resp 18 | Ht 72.0 in | Wt 313.5 lb

## 2023-10-11 DIAGNOSIS — J4531 Mild persistent asthma with (acute) exacerbation: Secondary | ICD-10-CM

## 2023-10-11 MED ORDER — ALBUTEROL SULFATE HFA 108 (90 BASE) MCG/ACT IN AERS: 2.0000 | INHALATION_SPRAY | Freq: Four times a day (QID) | RESPIRATORY_TRACT | 5 refills | Status: AC | PRN

## 2023-10-11 MED ORDER — PREDNISONE 10 MG PO TABS: ORAL_TABLET | ORAL | 0 refills | Status: DC

## 2023-10-11 MED ORDER — AZITHROMYCIN 250 MG PO TABS: ORAL_TABLET | ORAL | 0 refills | Status: DC

## 2023-10-11 NOTE — Progress Notes (Signed)
Subjective:    Patient ID: Cameron [name], male    DOB: August 14, 1946, 77 y.o.   MRN: [id number]  DOS:  10/11/2023 Type of visit - description: acute  Symptoms started 4 weeks ago Cough, chest congestion, wheezing.  Mild production of "cloudy" sputum. Symptoms started after he blew leaves from the yard and rode his motorcycle for few hours.  Has used Coricidin and albuterol.  Albuterol typically help with the symptoms temporarily.  He never felt sick with fever or chills.  No nausea vomiting.  No headaches.  No myalgias. No chest pain or difficulty breathing, he played tennis last night.  Review of Systems See above   Past Medical History:  Diagnosis Date   Allergy 2013   Arthritis    degenerative joint disease, knees    Asthma    Constipation due to pain medication    Diverticulosis of colon    Fatty liver    GERD (gastroesophageal reflux disease)    takes Prevacid   Glaucoma (increased eye pressure)    High cholesterol    HTN (hypertension)    Iron deficiency anemia    h/o   Keloid of skin    Lumbar spondylosis    possible right sided radiculopathy with hx of surgery by Dr. Mikal Plane in 2003   Osteopenia    DEXA 12-2006     Personal history of colonic polyps 2002   TUBULAR ADENOMA   Prostatitis, acute 04/2011   admitted to [facility]   Sleep apnea    on CPAP, last study - [facility]   Tick bite     Past Surgical History:  Procedure Laterality Date   APPENDECTOMY  2017   BACK SURGERY  2003   CHOLECYSTECTOMY  2003   COLONOSCOPY  2018   HIP ARTHROSCOPY Left 08/26/2022   JOINT REPLACEMENT  06/2014. 10/2014. 08/2022   Both knees. Left hip   LAPAROSCOPIC APPENDECTOMY N/A 01/16/2016   Procedure: APPENDECTOMY LAPAROSCOPIC;  Surgeon: Claud Kelp, MD;  [facility];  Service: General;  Laterality: N/A;   LUMBAR LAMINECTOMY/DECOMPRESSION MICRODISCECTOMY N/A 08/22/2015   Procedure: Lumbar two to lumbar three lumbar three to lumbar four microdiskectomy;  Surgeon: Coletta Memos,  MD;  [facility]: [facility];  Service: Neurosurgery;  Laterality: N/A;  L23 L34 microdiskectomy   SPINE SURGERY  02/2002. 07/2015   TOTAL KNEE ARTHROPLASTY Right 06/11/2014   Procedure: TOTAL KNEE ARTHROPLASTY;  Surgeon: Velna Ochs, MD;  [facility];  Service: Orthopedics;  Laterality: Right;   TOTAL KNEE ARTHROPLASTY Left 10/08/2014   DR DALLDORF   TOTAL KNEE ARTHROPLASTY Left 10/08/2014   Procedure: TOTAL LEFT KNEE ARTHROPLASTY;  Surgeon: Velna Ochs, MD;  [facility]: [facility];  Service: Orthopedics;  Laterality: Left;    Current Outpatient Medications  Medication Instructions   albuterol (VENTOLIN HFA) 108 (90 Base) MCG/ACT inhaler 2 puffs, Inhalation, Every 6 hours PRN   atorvastatin (LIPITOR) 10 mg, Oral, Daily at bedtime   brimonidine (ALPHAGAN) 0.2 % ophthalmic solution 3 drops, Daily   folic acid (FOLVITE) 1 mg, Oral, Daily   hydrochlorothiazide (HYDRODIURIL) 25 mg, Oral, Daily   latanoprost (XALATAN) 0.005 % ophthalmic solution 1 drop, Daily at bedtime   metoprolol succinate (TOPROL-XL) 12.5 mg, Oral, Daily, Take with or immediately following a meal   Myrbetriq 50 mg, Daily   potassium chloride (KLOR-CON M) 10 MEQ tablet 10 mEq, Oral, Daily   vitamin C 1,000 mg, Oral, 2 times daily       Objective:  Physical Exam BP 134/86   Pulse 73   Temp 98.2 F (36.8 C) (Oral)   Resp 18   Ht 6' (1.829 m)   Wt (!) 313 lb 8 oz (142.2 kg)   SpO2 99%   BMI 42.52 kg/m  General:   Well developed, NAD, BMI noted. HEENT:  Normocephalic . Face symmetric, atraumatic Ears: TMs obscured by wax Lungs:  Slightly prolonged expiratory time and few rhonchi.  No crackles. Normal respiratory effort, no intercostal retractions, no accessory muscle use. Heart: RRR,  no murmur.  Lower extremities: no pretibial edema bilaterally  Skin: Not pale. Not jaundice Neurologic:  alert & oriented X3.  Speech normal, gait appropriate for age and unassisted Psych--  Cognition and judgment  appear intact.  Cooperative with normal attention span and concentration.  Behavior appropriate. No anxious or depressed appearing.      Assessment   Assessment Hyperglycemia Hypertension High cholesterol OSA on CPAP GI: GERD, Fatty liver Asthma, previously RAD (occ wheezing w/ URIs, inhalers) DJD: lumbar spondylosis, back surgery 2003, knee replacements Morbid obesity  GU: --LUTS  Dr Sabino Gasser ([facility]) --h/o prostatitis admitted 2012 [facility] Glaucoma Osteopenia per DEXA 2008, multiple attempts to redo bone density test failed.  h/o iron deficiency  H/o  B12 and vit D def, 2016 levels wnl  PLAN: Mild/persistent asthma exacerbation. Prior to this exacerbation he was using albuterol very seldom. Plan: Prednisone, RF albuterol, Mucinex DM. If no gradually better or if symptoms resurface: Zithromax. Seek medical attention if symptoms severe. RTC for CPX: Recommend to reschedule an appointment.

## 2023-10-11 NOTE — Patient Instructions (Signed)
Take prednisone as prescribed  Use albuterol 2 puffs every 6 hours as needed.  Mucinex DM or Robitussin DM to help with cough.  If you are not gradually better or if your symptoms come back: Start an an antibiotic ---Zithromax.  Please call for questions or concerns If you have severe symptoms seek medical attention  Please reschedule your physical exam

## 2023-10-14 ENCOUNTER — Encounter: Payer: Medicare Other | Admitting: Internal Medicine

## 2023-10-14 NOTE — Assessment & Plan Note (Signed)
Mild/persistent asthma exacerbation. Prior to this exacerbation he was using albuterol very seldom. Plan: Prednisone, RF albuterol, Mucinex DM. If no gradually better or if symptoms resurface: Zithromax. Seek medical attention if symptoms severe. RTC for CPX: Recommend to reschedule an appointment.

## 2023-11-07 DIAGNOSIS — H43813 Vitreous degeneration, bilateral: Secondary | ICD-10-CM | POA: Diagnosis not present

## 2023-11-07 DIAGNOSIS — H401131 Primary open-angle glaucoma, bilateral, mild stage: Secondary | ICD-10-CM | POA: Diagnosis not present

## 2023-11-07 DIAGNOSIS — H25813 Combined forms of age-related cataract, bilateral: Secondary | ICD-10-CM | POA: Diagnosis not present

## 2023-11-07 DIAGNOSIS — H5203 Hypermetropia, bilateral: Secondary | ICD-10-CM | POA: Diagnosis not present

## 2023-11-07 DIAGNOSIS — H35033 Hypertensive retinopathy, bilateral: Secondary | ICD-10-CM | POA: Diagnosis not present

## 2023-11-07 DIAGNOSIS — H52223 Regular astigmatism, bilateral: Secondary | ICD-10-CM | POA: Diagnosis not present

## 2023-11-07 DIAGNOSIS — H524 Presbyopia: Secondary | ICD-10-CM | POA: Diagnosis not present

## 2023-12-08 ENCOUNTER — Ambulatory Visit (HOSPITAL_BASED_OUTPATIENT_CLINIC_OR_DEPARTMENT_OTHER): Payer: Medicare Other | Admitting: Pulmonary Disease

## 2023-12-08 ENCOUNTER — Encounter (HOSPITAL_BASED_OUTPATIENT_CLINIC_OR_DEPARTMENT_OTHER): Payer: Self-pay | Admitting: Pulmonary Disease

## 2023-12-08 VITALS — BP 122/86 | HR 76 | Ht 72.0 in | Wt 323.7 lb

## 2023-12-08 DIAGNOSIS — G4733 Obstructive sleep apnea (adult) (pediatric): Secondary | ICD-10-CM

## 2023-12-08 DIAGNOSIS — J4520 Mild intermittent asthma, uncomplicated: Secondary | ICD-10-CM | POA: Diagnosis not present

## 2023-12-08 NOTE — Addendum Note (Signed)
Addended byClyda Greener M on: 12/08/2023 11:41 AM   Modules accepted: Orders

## 2023-12-08 NOTE — Addendum Note (Signed)
Addended byClyda Greener M on: 12/08/2023 11:40 AM   Modules accepted: Orders

## 2023-12-08 NOTE — Progress Notes (Signed)
Subjective:    Patient ID: Cameron [name], male    DOB: 1946/03/09, 78 y.o.   MRN: [id number]  HPI  78 yo obese man presents to reestablish care for OSA   OSA was diagnosed in 1996 with PSG showing severe degree with AHI 87/hour. He was maintained on CPAP at 20 cm with nasal mask for many years and then switched to nasal pillows, pressure was dropped to 18 cm.    He obtain a new machine in 2017 Last seen 2020  He has been very compliant with his machine and denies any problems with mask or pressure.  He is settled down with nasal pillows.  In the interim he underwent both knee replacement and hip replacement and appendectomy.  He tries to stay active and play some tennis. Weight is unchanged and remains high at 324 pounds There is no history suggestive of cataplexy, sleep paralysis or parasomnias  CPAP download was reviewed which shows excellent control of events on auto settings 5 to 15 cm with average pressure of 14.2 and maximum pressure of 14.8 cm.  He has mild leak and is very compliant without a single missed night.  CPAP is only helped improve his daytime somnolence and fatigue.  Lately he feels that the pressure is not as high. He was diagnosed with mild intermittent asthma and given an albuterol inhaler. He denies excessive use of MDI, no nocturnal symptoms  Significant tests/ events   NPSG 2001:  AHI 87/hr  Past Medical History:  Diagnosis Date   Allergy 2013   Arthritis    degenerative joint disease, knees    Asthma    Constipation due to pain medication    Diverticulosis of colon    Fatty liver    GERD (gastroesophageal reflux disease)    takes Prevacid   Glaucoma (increased eye pressure)    High cholesterol    HTN (hypertension)    Iron deficiency anemia    h/o   Keloid of skin    Lumbar spondylosis    possible right sided radiculopathy with hx of surgery by Dr. Mikal Plane in 2003   Osteopenia    DEXA 12-2006     Personal history of colonic polyps 2002    TUBULAR ADENOMA   Prostatitis, acute 04/2011   admitted to [facility]   Sleep apnea    on CPAP, last study - [facility]   Tick bite     Past Surgical History:  Procedure Laterality Date   APPENDECTOMY  2017   BACK SURGERY  2003   CHOLECYSTECTOMY  2003   COLONOSCOPY  2018   HIP ARTHROSCOPY Left 08/26/2022   JOINT REPLACEMENT  06/2014. 10/2014. 08/2022   Both knees. Left hip   LAPAROSCOPIC APPENDECTOMY N/A 01/16/2016   Procedure: APPENDECTOMY LAPAROSCOPIC;  Surgeon: Claud Kelp, MD;  Location: [facility];  Service: General;  Laterality: N/A;   LUMBAR LAMINECTOMY/DECOMPRESSION MICRODISCECTOMY N/A 08/22/2015   Procedure: Lumbar two to lumbar three lumbar three to lumbar four microdiskectomy;  Surgeon: Coletta Memos, MD;  Location: [facility];  Service: Neurosurgery;  Laterality: N/A;  L23 L34 microdiskectomy   SPINE SURGERY  02/2002. 07/2015   TOTAL KNEE ARTHROPLASTY Right 06/11/2014   Procedure: TOTAL KNEE ARTHROPLASTY;  Surgeon: Velna Ochs, MD;  Location: [facility];  Service: Orthopedics;  Laterality: Right;   TOTAL KNEE ARTHROPLASTY Left 10/08/2014   DR DALLDORF   TOTAL KNEE ARTHROPLASTY Left 10/08/2014   Procedure: TOTAL LEFT KNEE ARTHROPLASTY;  Surgeon: Velna Ochs, MD;  Location: [facility];  Service: Orthopedics;  Laterality: Left;    Allergies  Allergen Reactions   Chlorhexidine Gluconate Hives and Other (See Comments)    Red rash developed on upper chest    Ciprofloxacin Hcl Itching   Clindamycin Itching and Rash    Social History   Socioeconomic History   Marital status: Married    Spouse name: Not on file   Number of children: 2   Years of education: Not on file   Highest education level: Bachelor's degree (e.g., BA, AB, BS)  Occupational History   Occupation: retired--works in HR  Tobacco Use   Smoking status: Never   Smokeless tobacco: Never  Vaping Use   Vaping status: Never Used  Substance and Sexual Activity   Alcohol use: Yes    Comment: most at 1/month   Drug  use: No   Sexual activity: Not on file  Other Topics Concern   Not on file  Social History Narrative   Household- pt and wife   Used to plays tennis sometimes    Social Drivers of Corporate investment banker Strain: Low Risk  (10/10/2023)   Overall Financial Resource Strain (CARDIA)    Difficulty of Paying Living Expenses: Not hard at all  Food Insecurity: No Food Insecurity (10/10/2023)   Hunger Vital Sign    Worried About Running Out of Food in the Last Year: Never true    Ran Out of Food in the Last Year: Never true  Transportation Needs: No Transportation Needs (10/10/2023)   PRAPARE - Administrator, Civil Service (Medical): No    Lack of Transportation (Non-Medical): No  Physical Activity: Sufficiently Active (10/10/2023)   Exercise Vital Sign    Days of Exercise per Week: 2 days    Minutes of Exercise per Session: 90 min  Stress: No Stress Concern Present (10/10/2023)   [employer] of Occupational Health - Occupational Stress Questionnaire    Feeling of Stress : Not at all  Social Connections: Socially Integrated (10/10/2023)   Social Connection and Isolation Panel [NHANES]    Frequency of Communication with Friends and Family: Twice a week    Frequency of Social Gatherings with Friends and Family: Once a week    Attends Religious Services: More than 4 times per year    Active Member of [employer] or Organizations: Yes    Attends Engineer, structural: More than 4 times per year    Marital Status: Married  Catering manager Violence: Not At Risk (02/07/2023)   Humiliation, Afraid, Rape, and Kick questionnaire    Fear of Current or Ex-Partner: No    Emotionally Abused: No    Physically Abused: No    Sexually Abused: No    Family History  Problem Relation Age of Onset   Dementia Mother    Diverticulitis Father    Colon cancer Maternal Uncle        dx age 21s   Colon cancer Other        grandfather, dx age 11   Stroke Other        maternal aunt in  her 50s   Breast cancer Maternal Aunt    Prostate cancer Neg Hx    Heart attack Neg Hx    Hypertension Neg Hx    Diabetes Neg Hx    Rectal cancer Neg Hx    Stomach cancer Neg Hx      Review of Systems Constitutional: negative for anorexia, fevers and sweats  Eyes: negative for irritation, redness and visual disturbance  Ears, nose, mouth, throat, and face: negative for earaches, epistaxis, nasal congestion and sore throat  Respiratory: negative for cough, dyspnea on exertion, sputum and wheezing  Cardiovascular: negative for chest pain, dyspnea, lower extremity edema, orthopnea, palpitations and syncope  Gastrointestinal: negative for abdominal pain, constipation, diarrhea, melena, nausea and vomiting  Genitourinary:negative for dysuria, frequency and hematuria  Hematologic/lymphatic: negative for bleeding, easy bruising and lymphadenopathy  Musculoskeletal:negative for arthralgias, muscle weakness and stiff joints  Neurological: negative for coordination problems, gait problems, headaches and weakness  Endocrine: negative for diabetic symptoms including polydipsia, polyuria and weight loss     Objective:   Physical Exam  Gen. Pleasant, obese, in no distress, normal affect ENT - no pallor,icterus, no post nasal drip, class 2-3 airway Neck: No JVD, no thyromegaly, no carotid bruits Lungs: no use of accessory muscles, no dullness to percussion, decreased without rales or rhonchi  Cardiovascular: Rhythm regular, heart sounds  normal, no murmurs or gallops, no peripheral edema Abdomen: soft and non-tender, no hepatosplenomegaly, BS normal. Musculoskeletal: No deformities, no cyanosis or clubbing Neuro:  alert, non focal, no tremors       Assessment & Plan:    OSA -he is very compliant.  CPAP is definitely helped improve his daytime somnolence and fatigue.  He reports decrease in pressure lately. Will change him to auto settings 10 to 15 cm.  He would be eligible for a new  machine his current machine is 78 years old.  We will send a prescription to DME. Weight loss encouraged, compliance with goal of at least 4-6 hrs every night is the expectation. Advised against medications with sedative side effects Cautioned against driving when sleepy - understanding that sleepiness will vary on a day to day basis   Mild intermittent asthma -uses albuterol on an as-needed basis.  Dyspnea mostly related to obesity

## 2023-12-08 NOTE — Patient Instructions (Signed)
X Rx for replacement autoCPAP 10-15 cm to adapt

## 2023-12-09 ENCOUNTER — Encounter: Payer: Medicare Other | Admitting: Internal Medicine

## 2023-12-13 ENCOUNTER — Ambulatory Visit (INDEPENDENT_AMBULATORY_CARE_PROVIDER_SITE_OTHER): Payer: Medicare Other | Admitting: Medical

## 2023-12-13 ENCOUNTER — Encounter: Payer: Self-pay | Admitting: Medical

## 2023-12-13 VITALS — BP 120/70 | HR 72 | Temp 97.9°F | Resp 16 | Ht 73.0 in | Wt 321.0 lb

## 2023-12-13 DIAGNOSIS — I10 Essential (primary) hypertension: Secondary | ICD-10-CM

## 2023-12-13 DIAGNOSIS — E785 Hyperlipidemia, unspecified: Secondary | ICD-10-CM

## 2023-12-13 DIAGNOSIS — R739 Hyperglycemia, unspecified: Secondary | ICD-10-CM

## 2023-12-13 DIAGNOSIS — Z0000 Encounter for general adult medical examination without abnormal findings: Secondary | ICD-10-CM

## 2023-12-13 NOTE — Patient Instructions (Addendum)
 For you wellness exam today I have ordered cbc, cmp, A1c and  lipid panel.  Vaccine appear up to date.   Recommend exercise and healthy diet.  We will let you know lab results as they come in.  Follow up date appointment will be determined after lab review.     Preventive Care 38 Years and Older, Male Preventive care refers to lifestyle choices and visits with your health care provider that can promote health and wellness. Preventive care visits are also called wellness exams. What can I expect for my preventive care visit? Counseling During your preventive care visit, your health care provider may ask about your: Medical history, including: Past medical problems. Family medical history. History of falls. Current health, including: Emotional well-being. Home life and relationship well-being. Sexual activity. Memory and ability to understand (cognition). Lifestyle, including: Alcohol, nicotine or tobacco, and drug use. Access to firearms. Diet, exercise, and sleep habits. Work and work astronomer. Sunscreen use. Safety issues such as seatbelt and bike helmet use. Physical exam Your health care provider will check your: Height and weight. These may be used to calculate your BMI (body mass index). BMI is a measurement that tells if you are at a healthy weight. Waist circumference. This measures the distance around your waistline. This measurement also tells if you are at a healthy weight and may help predict your risk of certain diseases, such as type 2 diabetes and high blood pressure. Heart rate and blood pressure. Body temperature. Skin for abnormal spots. What immunizations do I need?  Vaccines are usually given at various ages, according to a schedule. Your health care provider will recommend vaccines for you based on your age, medical history, and lifestyle or other factors, such as travel or where you work. What tests do I need? Screening Your health care provider may  recommend screening tests for certain conditions. This may include: Lipid and cholesterol levels. Diabetes screening. This is done by checking your blood sugar (glucose) after you have not eaten for a while (fasting). Hepatitis C test. Hepatitis B test. HIV (human immunodeficiency virus) test. STI (sexually transmitted infection) testing, if you are at risk. Lung cancer screening. Colorectal cancer screening. Prostate cancer screening. Abdominal aortic aneurysm (AAA) screening. You may need this if you are a current or former smoker. Talk with your health care provider about your test results, treatment options, and if necessary, the need for more tests. Follow these instructions at home: Eating and drinking  Eat a diet that includes fresh fruits and vegetables, whole grains, lean protein, and low-fat dairy products. Limit your intake of foods with high amounts of sugar, saturated fats, and salt. Take vitamin and mineral supplements as recommended by your health care provider. Do not drink alcohol if your health care provider tells you not to drink. If you drink alcohol: Limit how much you have to 0-2 drinks a day. Know how much alcohol is in your drink. In the [place] one drink equals one 12 oz bottle of beer (355 mL), one 5 oz glass of wine (148 mL), or one 1 oz glass of hard liquor (44 mL). Lifestyle Brush your teeth every morning and night with fluoride toothpaste. Floss one time each day. Exercise for at least 30 minutes 5 or more days each week. Do not use any products that contain nicotine or tobacco. These products include cigarettes, chewing tobacco, and vaping devices, such as e-cigarettes. If you need help quitting, ask your health care provider. Do not use drugs. If  you are sexually active, practice safe sex. Use a condom or other form of protection to prevent STIs. Take aspirin  only as told by your health care provider. Make sure that you understand how much to take and what  form to take. Work with your health care provider to find out whether it is safe and beneficial for you to take aspirin  daily. Ask your health care provider if you need to take a cholesterol-lowering medicine (statin). Find healthy ways to manage stress, such as: Meditation, yoga, or listening to music. Journaling. Talking to a trusted person. Spending time with friends and family. Safety Always wear your seat belt while driving or riding in a vehicle. Do not drive: If you have been drinking alcohol. Do not ride with someone who has been drinking. When you are tired or distracted. While texting. If you have been using any mind-altering substances or drugs. Wear a helmet and other protective equipment during sports activities. If you have firearms in your house, make sure you follow all gun safety procedures. Minimize exposure to UV radiation to reduce your risk of skin cancer. What's next? Visit your health care provider once a year for an annual wellness visit. Ask your health care provider how often you should have your eyes and teeth checked. Stay up to date on all vaccines. This information is not intended to replace advice given to you by your health care provider. Make sure you discuss any questions you have with your health care provider. Document Revised: 04/22/2021 Document Reviewed: 04/22/2021 Elsevier Patient Education  2024 [employer].

## 2023-12-13 NOTE — Progress Notes (Signed)
 Subjective:    Patient ID: Cameron [name], male    DOB: [birth year], 78 y.o.   MRN: [id number]  HPI  Pt in for wellness/physical. Last physical was in 2024. Pt is fasting  Pt used to Work in HR. Pt plays Tennis on Monday nights. Pt admit to not eating healthy. 1-2 cups of coffee a day. Non smoker. Very rare alcohol use.  No Vaccine recommended on review of care gaps.   Htn- on review bp controlled on second check.  Pt had colonscopy in 2018.   Reports hip and knees doing well.    Review of Systems  Constitutional:  Negative for chills, fatigue and fever.  HENT:  Negative for congestion and drooling.   Respiratory:  Negative for cough, chest tightness, shortness of breath and wheezing.   Cardiovascular:  Negative for chest pain and palpitations.  Gastrointestinal:  Negative for abdominal pain and blood in stool.  Genitourinary:  Negative for dysuria and flank pain.  Musculoskeletal:  Negative for back pain, myalgias and neck stiffness.  Skin:  Negative for rash.  Neurological:  Negative for dizziness and light-headedness.  Hematological:  Negative for adenopathy. Does not bruise/bleed easily.  Psychiatric/Behavioral:  Negative for behavioral problems and decreased concentration.    Past Medical History:  Diagnosis Date   Allergy 2013   Arthritis    degenerative joint disease, knees    Asthma    Constipation due to pain medication    Diverticulosis of colon    Fatty liver    GERD (gastroesophageal reflux disease)    takes Prevacid   Glaucoma (increased eye pressure)    High cholesterol    HTN (hypertension)    Iron deficiency anemia    h/o   Keloid of skin    Lumbar spondylosis    possible right sided radiculopathy with hx of surgery by Dr. Lindalee in 2003   Osteopenia    DEXA 12-2006     Personal history of colonic polyps 2002   TUBULAR ADENOMA   Prostatitis, acute 04/2011   admitted to [facility]   Sleep apnea    on CPAP, last study - [facility]   Tick bite       Social History   Socioeconomic History   Marital status: Married    Spouse name: Not on file   Number of children: 2   Years of education: Not on file   Highest education level: Bachelor's degree (e.g., BA, AB, BS)  Occupational History   Occupation: retired--works in HR  Tobacco Use   Smoking status: Never   Smokeless tobacco: Never  Vaping Use   Vaping status: Never Used  Substance and Sexual Activity   Alcohol use: Yes    Comment: most at 1/month   Drug use: No   Sexual activity: Not on file  Other Topics Concern   Not on file  Social History Narrative   Household- pt and wife   Used to plays tennis sometimes    Social Drivers of Corporate Investment Banker Strain: Low Risk  (12/10/2023)   Overall Financial Resource Strain (CARDIA)    Difficulty of Paying Living Expenses: Not hard at all  Food Insecurity: No Food Insecurity (12/10/2023)   Hunger Vital Sign    Worried About Running Out of Food in the Last Year: Never true    Ran Out of Food in the Last Year: Never true  Transportation Needs: No Transportation Needs (12/10/2023)   PRAPARE - Transportation    Lack of  Transportation (Medical): No    Lack of Transportation (Non-Medical): No  Physical Activity: Insufficiently Active (12/10/2023)   Exercise Vital Sign    Days of Exercise per Week: 1 day    Minutes of Exercise per Session: 90 min  Stress: No Stress Concern Present (12/10/2023)   [employer] of Occupational Health - Occupational Stress Questionnaire    Feeling of Stress : Not at all  Social Connections: Socially Integrated (12/10/2023)   Social Connection and Isolation Panel [NHANES]    Frequency of Communication with Friends and Family: Twice a week    Frequency of Social Gatherings with Friends and Family: Once a week    Attends Religious Services: More than 4 times per year    Active Member of [employer] or Organizations: Yes    Attends Banker Meetings: More than 4 times per year    Marital  Status: Married  Catering Manager Violence: Not At Risk (02/07/2023)   Humiliation, Afraid, Rape, and Kick questionnaire    Fear of Current or Ex-Partner: No    Emotionally Abused: No    Physically Abused: No    Sexually Abused: No    Past Surgical History:  Procedure Laterality Date   APPENDECTOMY  2017   BACK SURGERY  2003   CHOLECYSTECTOMY  2003   COLONOSCOPY  2018   HIP ARTHROSCOPY Left 08/26/2022   JOINT REPLACEMENT  06/2014. 10/2014. 08/2022   Both knees. Left hip   LAPAROSCOPIC APPENDECTOMY N/A 01/16/2016   Procedure: APPENDECTOMY LAPAROSCOPIC;  Surgeon: Elon Pacini, MD;  Location: [facility] OR;  Service: General;  Laterality: N/A;   LUMBAR LAMINECTOMY/DECOMPRESSION MICRODISCECTOMY N/A 08/22/2015   Procedure: Lumbar two to lumbar three lumbar three to lumbar four microdiskectomy;  Surgeon: Rockey Peru, MD;  Location: [facility] NEURO ORS;  Service: Neurosurgery;  Laterality: N/A;  L23 L34 microdiskectomy   SPINE SURGERY  02/2002. 07/2015   TOTAL KNEE ARTHROPLASTY Right 06/11/2014   Procedure: TOTAL KNEE ARTHROPLASTY;  Surgeon: Maude KANDICE Herald, MD;  Location: [facility] OR;  Service: Orthopedics;  Laterality: Right;   TOTAL KNEE ARTHROPLASTY Left 10/08/2014   DR DALLDORF   TOTAL KNEE ARTHROPLASTY Left 10/08/2014   Procedure: TOTAL LEFT KNEE ARTHROPLASTY;  Surgeon: Maude KANDICE Herald, MD;  Location: [facility] OR;  Service: Orthopedics;  Laterality: Left;    Family History  Problem Relation Age of Onset   Dementia Mother    Diverticulitis Father    Colon cancer Maternal Uncle        dx age 31s   Colon cancer Other        grandfather, dx age 77   Stroke Other        maternal aunt in her 38s   Breast cancer Maternal Aunt    Prostate cancer Neg Hx    Heart attack Neg Hx    Hypertension Neg Hx    Diabetes Neg Hx    Rectal cancer Neg Hx    Stomach cancer Neg Hx     Allergies  Allergen Reactions   Chlorhexidine  Gluconate Hives and Other (See Comments)    Red rash developed on upper chest     Ciprofloxacin  Hcl Itching   Clindamycin Itching and Rash    Current Outpatient Medications on File Prior to Visit  Medication Sig Dispense Refill   albuterol  (VENTOLIN  HFA) 108 (90 Base) MCG/ACT inhaler Inhale 2 puffs into the lungs every 6 (six) hours as needed for wheezing or shortness of breath. 18 g 5   Ascorbic Acid  (VITAMIN C ) 1000  MG tablet Take 1,000 mg by mouth 2 (two) times daily.      atorvastatin  (LIPITOR) 10 MG tablet Take 1 tablet (10 mg total) by mouth at bedtime. 90 tablet 1   brimonidine (ALPHAGAN) 0.2 % ophthalmic solution 3 drops daily.      folic acid  (FOLVITE ) 1 MG tablet Take 1 tablet (1 mg total) by mouth daily. 90 tablet 1   hydrochlorothiazide  (HYDRODIURIL ) 25 MG tablet Take 1 tablet (25 mg total) by mouth daily. 90 tablet 1   latanoprost  (XALATAN ) 0.005 % ophthalmic solution Place 1 drop into both eyes at bedtime.      metoprolol  succinate (TOPROL -XL) 25 MG 24 hr tablet Take 0.5 tablets (12.5 mg total) by mouth daily. Take with or immediately following a meal 45 tablet 1   MYRBETRIQ  50 MG TB24 tablet Take 50 mg by mouth daily.      potassium chloride  (KLOR-CON  M) 10 MEQ tablet Take 1 tablet (10 mEq total) by mouth daily. 90 tablet 1   predniSONE  (DELTASONE ) 10 MG tablet 4 tablets x 2 days, 3 tabs x 2 days, 2 tabs x 2 days, 1 tab x 2 days 20 tablet 0   azithromycin  (ZITHROMAX  Z-PAK) 250 MG tablet 2 tabs a day the first day, then 1 tab a day x 4 days 6 tablet 0   No current facility-administered medications on file prior to visit.    BP 120/70   Pulse 72   Temp 97.9 F (36.6 C) (Oral)   Resp 16   Ht 6' 1 (1.854 m)   Wt (!) 321 lb (145.6 kg)   SpO2 97%   BMI 42.35 kg/m        Objective:   Physical Exam  General Mental Status- Alert. General Appearance- Not in acute distress.   Skin General: Color- Normal Color. Moisture- Normal Moisture.  Neck Carotid Arteries- Normal color. Moisture- Normal Moisture. No carotid bruits. No JVD.  Chest and Lung  Exam Auscultation: Breath Sounds:-CTA  Cardiovascular Auscultation:Rythm- RRR Murmurs & Other Heart Sounds:Auscultation of the heart reveals- No Murmurs.  Abdomen Inspection:-Inspeection Normal. Palpation/Percussion:Note:No mass. Palpation and Percussion of the abdomen reveal- Non Tender, Non Distended + BS, no rebound or guarding.  Neurologic Cranial Nerve exam:- CN III-XII intact(No nystagmus), symmetric smile. Strength:- 5/5 equal and symmetric strength both upper and lower extremities.       Assessment & Plan:   Patient Instructions  For you wellness exam today I have ordered cbc, cmp, A1c and  lipid panel.  Vaccine appear up to date.   Recommend exercise and healthy diet.  We will let you know lab results as they come in.  Follow up date appointment will be determined after lab review.      Loriann Bosserman, PA-C

## 2023-12-14 LAB — COMPREHENSIVE METABOLIC PANEL
ALT: 18 U/L (ref 0–53)
AST: 19 U/L (ref 0–37)
Albumin: 4.1 g/dL (ref 3.5–5.2)
Alkaline Phosphatase: 80 U/L (ref 39–117)
BUN: 11 mg/dL (ref 6–23)
CO2: 30 meq/L (ref 19–32)
Calcium: 9 mg/dL (ref 8.4–10.5)
Chloride: 102 meq/L (ref 96–112)
Creatinine, Ser: 0.89 mg/dL (ref 0.40–1.50)
GFR: 84.01 mL/min (ref >60.00)
Glucose, Bld: 85 mg/dL (ref 70–99)
Potassium: 4 meq/L (ref 3.5–5.1)
Sodium: 140 meq/L (ref 135–145)
Total Bilirubin: 1.1 mg/dL (ref 0.2–1.2)
Total Protein: 6.6 g/dL (ref 6.0–8.3)

## 2023-12-14 LAB — LIPID PANEL
Cholesterol: 114 mg/dL (ref 0–200)
HDL: 42.5 mg/dL (ref >39.00)
LDL Cholesterol: 64 mg/dL (ref 0–99)
NonHDL: 71.62
Total CHOL/HDL Ratio: 3
Triglycerides: 40 mg/dL (ref 0.0–149.0)
VLDL: 8 mg/dL (ref 0.0–40.0)

## 2023-12-14 LAB — CBC WITH DIFFERENTIAL/PLATELET
Basophils Absolute: 0 10*3/uL (ref 0.0–0.1)
Basophils Relative: 0.6 % (ref 0.0–3.0)
Eosinophils Absolute: 0.2 10*3/uL (ref 0.0–0.7)
Eosinophils Relative: 6 % — ABNORMAL HIGH (ref 0.0–5.0)
HCT: 41.1 % (ref 39.0–52.0)
Hemoglobin: 13.9 g/dL (ref 13.0–17.0)
Lymphocytes Relative: 33.7 % (ref 12.0–46.0)
Lymphs Abs: 1.4 10*3/uL (ref 0.7–4.0)
MCHC: 33.9 g/dL (ref 30.0–36.0)
MCV: 90.8 fL (ref 78.0–100.0)
Monocytes Absolute: 0.6 10*3/uL (ref 0.1–1.0)
Monocytes Relative: 15.2 % — ABNORMAL HIGH (ref 3.0–12.0)
Neutro Abs: 1.8 10*3/uL (ref 1.4–7.7)
Neutrophils Relative %: 44.5 % (ref 43.0–77.0)
Platelets: 201 10*3/uL (ref 150.0–400.0)
RBC: 4.53 Mil/uL (ref 4.22–5.81)
RDW: 14.1 % (ref 11.5–15.5)
WBC: 4 10*3/uL (ref 4.0–10.5)

## 2023-12-14 LAB — HEMOGLOBIN A1C: Hgb A1c MFr Bld: 5.9 % (ref 4.6–6.5)

## 2023-12-15 ENCOUNTER — Encounter: Payer: Self-pay | Admitting: Medical

## 2024-01-25 ENCOUNTER — Other Ambulatory Visit: Payer: Self-pay | Admitting: Internal Medicine

## 2024-03-06 ENCOUNTER — Telehealth: Payer: Self-pay

## 2024-03-06 DIAGNOSIS — G4733 Obstructive sleep apnea (adult) (pediatric): Secondary | ICD-10-CM

## 2024-03-06 NOTE — Telephone Encounter (Signed)
 Looking at order which was sent by Amy on 12/08/2023 for the following: Can someone check on this to see what we need to do?   Copied from CRM [id number]. Topic: General - Other >> Mar 05, 2024  4:17 PM Hilton Lucky wrote: Reason for CRM: [place] with [name] is calling to states that they need a detailed written and signed order as well as face to face notes regarding CPAP Machine and supplies.  States if it is a new machine, they will need settings as well.   Fax Number provided.

## 2024-03-07 ENCOUNTER — Telehealth: Payer: Self-pay | Admitting: Internal Medicine

## 2024-03-07 NOTE — Telephone Encounter (Unsigned)
 Copied from CRM [id number]. Topic: Clinical - Prescription Issue >> Mar 06, 2024  9:42 AM Juliana Ocean wrote: Reason for CRM: [place] w/ [facility] following up on fax they have sent several times.  They need more detailed information. Jan is re faxing now, if you could complete and  have the dr sign, and return asap Then they can get pt's CPAP to h[facility] In questions? Call Cb [phone] Fax  [phone]

## 2024-03-07 NOTE — Telephone Encounter (Signed)
 Copied from CRM [id number]. Topic: Medicare AWV >> Mar 07, 2024  2:49 PM Juliana Ocean wrote: Reason for CRM: LVM 03/06/2024 to schedule AWV. Please schedule Virtual or Telehealth visits ONLY.   Rosalee Collins; Care Guide Ambulatory Clinical Support [place] [facility] Direct Dial: [phone]

## 2024-03-07 NOTE — Telephone Encounter (Addendum)
 anna from [facility] sent a prescription to be signed  and still havent received yet will send a new fax for signed prescription . For cpap supplies . Will send to be signed and faxed back asap.  Fax number [phone]

## 2024-03-14 NOTE — Telephone Encounter (Signed)
 Dr Alva or Jamie,   Has anything been received from one of you yet? If a fax hasn't been received, it may be best to just electronically send over a new order if Dr Alva is okay with that.

## 2024-03-15 NOTE — Telephone Encounter (Signed)
 New order placed

## 2024-03-15 NOTE — Addendum Note (Signed)
 Addended by: Kary Pages on: 03/15/2024 09:48 AM   Modules accepted: Orders

## 2024-04-13 ENCOUNTER — Encounter: Payer: Self-pay | Admitting: Internal Medicine

## 2024-05-16 DIAGNOSIS — H04123 Dry eye syndrome of bilateral lacrimal glands: Secondary | ICD-10-CM | POA: Diagnosis not present

## 2024-05-16 DIAGNOSIS — H401131 Primary open-angle glaucoma, bilateral, mild stage: Secondary | ICD-10-CM | POA: Diagnosis not present

## 2024-05-16 DIAGNOSIS — H35033 Hypertensive retinopathy, bilateral: Secondary | ICD-10-CM | POA: Diagnosis not present

## 2024-05-16 DIAGNOSIS — H25813 Combined forms of age-related cataract, bilateral: Secondary | ICD-10-CM | POA: Diagnosis not present

## 2024-05-22 ENCOUNTER — Ambulatory Visit (INDEPENDENT_AMBULATORY_CARE_PROVIDER_SITE_OTHER)

## 2024-05-22 VITALS — Ht 73.0 in | Wt 321.0 lb

## 2024-05-22 DIAGNOSIS — Z0000 Encounter for general adult medical examination without abnormal findings: Secondary | ICD-10-CM | POA: Diagnosis not present

## 2024-05-22 NOTE — Progress Notes (Signed)
 Subjective:   Cameron [name] is a 78 y.o. who presents for a Medicare Wellness preventive visit.  As a reminder, Annual Wellness Visits don't include a physical exam, and some assessments may be limited, especially if this visit is performed virtually. We may recommend an in-person follow-up visit with your provider if needed.  Visit Complete: Virtual I connected with  [name] on 05/22/24 by a audio enabled telemedicine application and verified that I am speaking with the correct person using two identifiers.  Patient Location: Home  Provider Location: Home Office  I discussed the limitations of evaluation and management by telemedicine. The patient expressed understanding and agreed to proceed.  Vital Signs: Because this visit was a virtual/telehealth visit, some criteria may be missing or patient reported. Any vitals not documented were not able to be obtained and vitals that have been documented are patient reported.  VideoDeclined- This patient declined Librarian, academic. Therefore the visit was completed with audio only.  Persons Participating in Visit: Patient.  AWV Questionnaire: Yes: Patient Medicare AWV questionnaire was completed by the patient on 05/21/24; I have confirmed that all information answered by patient is correct and no changes since this date.  Cardiac Risk Factors include: advanced age (>61men, >19 women);dyslipidemia;male gender;hypertension     Objective:    Today's Vitals   05/22/24 1504  Weight: (!) 321 lb (145.6 kg)  Height: 6' 1 (1.854 m)   Body mass index is 42.35 kg/m.     05/22/2024    3:08 PM 02/07/2023    1:01 PM 01/05/2022    9:43 AM 11/02/2021    2:56 PM 04/28/2020   11:32 AM 10/16/2019    8:13 AM 12/05/2017    7:27 AM  Advanced Directives  Does Patient Have a Medical Advance Directive? No No No No Yes No No   Does patient want to make changes to medical advance directive?      No - Patient  declined   Would patient like information on creating a medical advance directive? Yes (MAU/Ambulatory/Procedural Areas - Information given) No - Patient declined No - Patient declined No - Patient declined   No - Patient declined      Data saved with a previous flowsheet row definition    Current Medications (verified) Outpatient Encounter Medications as of 05/22/2024  Medication Sig   albuterol  (VENTOLIN  HFA) 108 (90 Base) MCG/ACT inhaler Inhale 2 puffs into the lungs every 6 (six) hours as needed for wheezing or shortness of breath.   Ascorbic Acid  (VITAMIN C ) 1000 MG tablet Take 1,000 mg by mouth 2 (two) times daily.    atorvastatin  (LIPITOR) 10 MG tablet TAKE 1 TABLET BY MOUTH AT  BEDTIME   brimonidine (ALPHAGAN) 0.2 % ophthalmic solution 3 drops daily.    folic acid  (FOLVITE ) 1 MG tablet TAKE 1 TABLET BY MOUTH DAILY   hydrochlorothiazide  (HYDRODIURIL ) 25 MG tablet Take 1 tablet (25 mg total) by mouth daily.   latanoprost  (XALATAN ) 0.005 % ophthalmic solution Place 1 drop into both eyes at bedtime.    metoprolol  succinate (TOPROL -XL) 25 MG 24 hr tablet TAKE ONE-HALF TABLET BY MOUTH  DAILY WITH OR IMMEDIATELY  FOLLOWING A MEAL   MYRBETRIQ  50 MG TB24 tablet Take 50 mg by mouth daily.    potassium chloride  (KLOR-CON  M) 10 MEQ tablet Take 1 tablet (10 mEq total) by mouth daily.   [DISCONTINUED] azithromycin  (ZITHROMAX  Z-PAK) 250 MG tablet 2 tabs a day the first day, then 1 tab  a day x 4 days   [DISCONTINUED] predniSONE  (DELTASONE ) 10 MG tablet 4 tablets x 2 days, 3 tabs x 2 days, 2 tabs x 2 days, 1 tab x 2 days   No facility-administered encounter medications on file as of 05/22/2024.    Allergies (verified) Chlorhexidine  gluconate, Ciprofloxacin  hcl, and Clindamycin   History: Past Medical History:  Diagnosis Date   Allergy 2013   Arthritis    degenerative joint disease, knees    Asthma    Constipation due to pain medication    Diverticulosis of colon    Fatty liver    GERD  (gastroesophageal reflux disease)    takes Prevacid   Glaucoma (increased eye pressure)    High cholesterol    HTN (hypertension)    Iron deficiency anemia    h/o   Keloid of skin    Lumbar spondylosis    possible right sided radiculopathy with hx of surgery by Dr. Lindalee in 2003   Osteopenia    DEXA 12-2006     Personal history of colonic polyps 2002   TUBULAR ADENOMA   Prostatitis, acute 04/2011   admitted to [facility]   Sleep apnea    on CPAP, last study - [facility]   Tick bite    Past Surgical History:  Procedure Laterality Date   APPENDECTOMY  2017   BACK SURGERY  2003   CHOLECYSTECTOMY  2003   COLONOSCOPY  2018   HIP ARTHROSCOPY Left 08/26/2022   JOINT REPLACEMENT  06/2014. 10/2014. 08/2022   Both knees. Left hip   LAPAROSCOPIC APPENDECTOMY N/A 01/16/2016   Procedure: APPENDECTOMY LAPAROSCOPIC;  Surgeon: Elon Pacini, MD;  Location: [facility] OR;  Service: General;  Laterality: N/A;   LUMBAR LAMINECTOMY/DECOMPRESSION MICRODISCECTOMY N/A 08/22/2015   Procedure: Lumbar two to lumbar three lumbar three to lumbar four microdiskectomy;  Surgeon: Rockey Peru, MD;  Location: [facility] NEURO ORS;  Service: Neurosurgery;  Laterality: N/A;  L23 L34 microdiskectomy   SPINE SURGERY  02/2002. 07/2015   TOTAL KNEE ARTHROPLASTY Right 06/11/2014   Procedure: TOTAL KNEE ARTHROPLASTY;  Surgeon: Maude KANDICE Herald, MD;  Location: [facility] OR;  Service: Orthopedics;  Laterality: Right;   TOTAL KNEE ARTHROPLASTY Left 10/08/2014   DR DALLDORF   TOTAL KNEE ARTHROPLASTY Left 10/08/2014   Procedure: TOTAL LEFT KNEE ARTHROPLASTY;  Surgeon: Maude KANDICE Herald, MD;  Location: [facility] OR;  Service: Orthopedics;  Laterality: Left;   Family History  Problem Relation Age of Onset   Dementia Mother    Diverticulitis Father    Colon cancer Maternal Uncle        dx age 21s   Cancer Maternal Uncle    Colon cancer Other        grandfather, dx age 1   Stroke Other        maternal aunt in her 75s   Breast cancer Maternal Aunt    Cancer  Maternal Aunt    Cancer Maternal Grandfather    Asthma Paternal Grandfather    Asthma Daughter    Drug abuse Brother    Prostate cancer Neg Hx    Heart attack Neg Hx    Hypertension Neg Hx    Diabetes Neg Hx    Rectal cancer Neg Hx    Stomach cancer Neg Hx    Social History   Socioeconomic History   Marital status: Married    Spouse name: Not on file   Number of children: 2   Years of education: Not on file   Highest education  level: Bachelor's degree (e.g., BA, AB, BS)  Occupational History   Occupation: retired--works in HR  Tobacco Use   Smoking status: Never   Smokeless tobacco: Never  Vaping Use   Vaping status: Never Used  Substance and Sexual Activity   Alcohol use: Yes    Comment: Occasional liquor   Drug use: No   Sexual activity: Yes    Birth control/protection: None  Other Topics Concern   Not on file  Social History Narrative   Household- pt and wife   Used to plays tennis sometimes    Social Drivers of Corporate investment banker Strain: Low Risk  (05/21/2024)   Overall Financial Resource Strain (CARDIA)    Difficulty of Paying Living Expenses: Not hard at all  Food Insecurity: No Food Insecurity (05/21/2024)   Hunger Vital Sign    Worried About Running Out of Food in the Last Year: Never true    Ran Out of Food in the Last Year: Never true  Transportation Needs: No Transportation Needs (05/21/2024)   PRAPARE - Administrator, Civil Service (Medical): No    Lack of Transportation (Non-Medical): No  Physical Activity: Insufficiently Active (05/21/2024)   Exercise Vital Sign    Days of Exercise per Week: 2 days    Minutes of Exercise per Session: 30 min  Stress: No Stress Concern Present (05/21/2024)   [employer] of Occupational Health - Occupational Stress Questionnaire    Feeling of Stress: Not at all  Social Connections: Socially Integrated (05/21/2024)   Social Connection and Isolation Panel    Frequency of Communication with  Friends and Family: Twice a week    Frequency of Social Gatherings with Friends and Family: Three times a week    Attends Religious Services: More than 4 times per year    Active Member of Clubs or Organizations: Yes    Attends Banker Meetings: More than 4 times per year    Marital Status: Married    Tobacco Counseling Counseling given: Not Answered    Clinical Intake:  Pre-visit preparation completed: Yes  Pain : No/denies pain     Diabetes: No  Lab Results  Component Value Date   HGBA1C 5.9 12/13/2023   HGBA1C 5.6 10/06/2022   HGBA1C 6.0 10/05/2021     How often do you need to have someone help you when you read instructions, pamphlets, or other written materials from your doctor or pharmacy?: 1 - Never  Interpreter Needed?: No  Information entered by :: Charmaine Bloodgood LPN   Activities of Daily Living     05/21/2024    1:09 PM 02/13/2024    3:02 PM  In your present state of health, do you have any difficulty performing the following activities:  Hearing? 0 0  Vision? 0 0  Difficulty concentrating or making decisions? 0 0  Walking or climbing stairs? 0 0  Dressing or bathing? 0 0  Doing errands, shopping? 0 0  Preparing Food and eating ? N N  Using the Toilet? N N  In the past six months, have you accidently leaked urine? Y Y  Do you have problems with loss of bowel control? N N  Managing your Medications? N N  Managing your Finances? N N  Housekeeping or managing your Housekeeping? N N    Patient Care Team: Amon Aloysius BRAVO, MD as PCP - General Sheril Coy, MD as Consulting Physician (Orthopedic Surgery) Steen Evalene CROME., MD (Urology) Jude Harden GAILS, MD as  Consulting Physician (Pulmonary Disease) Kennyth Cy RAMAN, DO (Optometry)  I have updated your Care Teams any recent Medical Services you may have received from other providers in the past year.     Assessment:   This is a routine wellness examination for Sim.  Hearing/Vision  screen Hearing Screening - Comments:: Denies hearing difficulties   Vision Screening - Comments:: Wears rx glasses - up to date with routine eye exams with Dr. Elvie Kennyth    Goals Addressed             This Visit's Progress    Patient Stated   On track    Maintain healthy lifestyle.       Depression Screen     05/22/2024    3:08 PM 10/11/2023    2:49 PM 04/06/2023    9:57 AM 02/07/2023    1:03 PM 10/06/2022   10:07 AM 06/09/2022    9:39 AM 01/05/2022    9:46 AM  PHQ 2/9 Scores  PHQ - 2 Score 0 0 0 0 0 0 0    Fall Risk     05/21/2024    1:09 PM 02/13/2024    3:02 PM 10/11/2023    2:49 PM 04/06/2023    9:57 AM 02/02/2023   10:39 AM  Fall Risk   Falls in the past year? 1 0 0 0 0  Number falls in past yr: 0 0 0 0 0  Injury with Fall? 0 0 0 0 0  Risk for fall due to : History of fall(s)    No Fall Risks  Follow up Falls evaluation completed;Education provided;Falls prevention discussed  Falls evaluation completed Falls evaluation completed Falls evaluation completed    MEDICARE RISK AT HOME:  Medicare Risk at Home Any stairs in or around the home?: (Patient-Rptd) Yes If so, are there any without handrails?: (Patient-Rptd) No Home free of loose throw rugs in walkways, pet beds, electrical cords, etc?: (Patient-Rptd) Yes Adequate lighting in your home to reduce risk of falls?: (Patient-Rptd) Yes Life alert?: (Patient-Rptd) No Use of a cane, walker or w/c?: (Patient-Rptd) No Grab bars in the bathroom?: (Patient-Rptd) No Shower chair or bench in shower?: (Patient-Rptd) Yes Elevated toilet seat or a handicapped toilet?: (Patient-Rptd) No  TIMED UP AND GO:  Was the test performed?  No  Cognitive Function: Declined/Normal: No cognitive concerns noted by patient or family. Patient alert, oriented, able to answer questions appropriately and recall recent events. No signs of memory loss or confusion.        02/07/2023    1:08 PM  6CIT Screen  What Year? 0 points  What  month? 0 points  What time? 0 points  Count back from 20 4 points  Months in reverse 0 points  Repeat phrase 0 points  Total Score 4 points    Immunizations Immunization History  Administered Date(s) Administered   Fluad Quad(high Dose 65+) 07/24/2019   Influenza Split 07/09/2014, 09/08/2021, 08/08/2022   Influenza Whole 08/09/2008, 07/10/2011   Influenza, High Dose Seasonal PF 09/19/2018   Influenza,inj,Quad PF,6+ Mos 07/08/2016   Influenza-Unspecified 08/30/2017, 08/08/2020, 07/26/2023   Novavax(Covid-19) Vaccine 07/26/2023   PFIZER(Purple Top)SARS-COV-2 Vaccination 11/27/2019, 12/19/2019, 08/06/2020, 05/31/2021   Pfizer Covid-19 Vaccine Bivalent Booster 58yrs & up 10/13/2021, 09/14/2022   Pneumococcal Conjugate-13 09/10/2016   Pneumococcal Polysaccharide-23 07/23/2014, 10/05/2021   RSV,unspecified 10/24/2022   Td 06/08/2006, 09/10/2016   Zoster Recombinant(Shingrix ) 07/03/2021, 09/08/2021   Zoster, Live 04/06/2013    Screening Tests Health Maintenance  Topic Date Due  COVID-19 Vaccine (8 - 2024-25 season) 04/13/2025 (Originally 09/20/2023)   INFLUENZA VACCINE  06/08/2024   Medicare Annual Wellness (AWV)  05/22/2025   DTaP/Tdap/Td (3 - Tdap) 09/10/2026   Pneumococcal Vaccine: 50+ Years  Completed   Hepatitis C Screening  Completed   Zoster Vaccines- Shingrix   Completed   Hepatitis B Vaccines  Aged Out   HPV VACCINES  Aged Out   Meningococcal B Vaccine  Aged Out   Colonoscopy  Discontinued    Health Maintenance  There are no preventive care reminders to display for this patient.   Additional Screening:  Vision Screening: Recommended annual ophthalmology exams for early detection of glaucoma and other disorders of the eye. Would you like a referral to an eye doctor? No    Dental Screening: Recommended annual dental exams for proper oral hygiene  Community Resource Referral / Chronic Care Management: CRR required this visit?  No   CCM required this visit?   No   Plan:    I have personally reviewed and noted the following in the patient's chart:   Medical and social history Use of alcohol, tobacco or illicit drugs  Current medications and supplements including opioid prescriptions. Patient is not currently taking opioid prescriptions. Functional ability and status Nutritional status Physical activity Advanced directives List of other physicians Hospitalizations, surgeries, and ER visits in previous 12 months Vitals Screenings to include cognitive, depression, and falls Referrals and appointments  In addition, I have reviewed and discussed with patient certain preventive protocols, quality metrics, and best practice recommendations. A written personalized care plan for preventive services as well as general preventive health recommendations were provided to patient.   Lavelle Pfeiffer [place], [place]   [id number]/84/7974   After Visit Summary: (MyChart) Due to this being a telephonic visit, the after visit summary with patients personalized plan was offered to patient via MyChart   Notes: Nothing significant to report at this time.

## 2024-05-22 NOTE — Patient Instructions (Signed)
 Mr. Toepfer , Thank you for taking time out of your busy schedule to complete your Annual Wellness Visit with me. I enjoyed our conversation and look forward to speaking with you again next year. I, as well as your care team,  appreciate your ongoing commitment to your health goals. Please review the following plan we discussed and let me know if I can assist you in the future. Your Game plan/ To Do List     Follow up Visits: Next Medicare AWV with our clinical staff: In 1 year    Have you seen your provider in the last 6 months (3 months if uncontrolled diabetes)? Yes Next Office Visit with your provider: To be scheduled  Clinician Recommendations:  Aim for 30 minutes of exercise or brisk walking, 6-8 glasses of water, and 5 servings of fruits and vegetables each day.       This is a list of the screening recommended for you and due dates:  Health Maintenance  Topic Date Due   COVID-19 Vaccine (8 - 2024-25 season) 04/13/2025*   Flu Shot  06/08/2024   Medicare Annual Wellness Visit  05/22/2025   DTaP/Tdap/Td vaccine (3 - Tdap) 09/10/2026   Pneumococcal Vaccine for age over 6  Completed   Hepatitis C Screening  Completed   Zoster (Shingles) Vaccine  Completed   Hepatitis B Vaccine  Aged Out   HPV Vaccine  Aged Out   Meningitis B Vaccine  Aged Out   Colon Cancer Screening  Discontinued  *Topic was postponed. The date shown is not the original due date.    Advanced directives: (ACP Link)Information on Advanced Care Planning can be found at [place]  Secretary of [facility] Advance Health Care Directives Advance Health Care Directives. [email or web address]   Advance Care Planning is important because it:  [x]  Makes sure you receive the medical care that is consistent with your values, goals, and preferences  [x]  It provides guidance to your family and loved ones and reduces their decisional burden about whether or not they are making the right decisions based on your wishes.  Follow the  link provided in your after visit summary or read over the paperwork we have mailed to you to help you started getting your Advance Directives in place. If you need assistance in completing these, please reach out to us  so that we can help you!  See attachments for Preventive Care and Fall Prevention Tips.

## 2024-06-06 ENCOUNTER — Telehealth (HOSPITAL_BASED_OUTPATIENT_CLINIC_OR_DEPARTMENT_OTHER): Payer: Self-pay

## 2024-06-06 NOTE — Telephone Encounter (Signed)
 CMN received for CPAP supplies signed by provider and faxed confirmation received

## 2024-06-08 ENCOUNTER — Telehealth (HOSPITAL_BASED_OUTPATIENT_CLINIC_OR_DEPARTMENT_OTHER): Payer: Self-pay

## 2024-06-08 NOTE — Telephone Encounter (Signed)
 CMN received for CPAP supplies signed by provider and faxed confirmation received

## 2024-07-04 ENCOUNTER — Other Ambulatory Visit: Payer: Self-pay | Admitting: Internal Medicine

## 2024-08-07 ENCOUNTER — Emergency Department (HOSPITAL_BASED_OUTPATIENT_CLINIC_OR_DEPARTMENT_OTHER)
Admission: EM | Admit: 2024-08-07 | Discharge: 2024-08-07 | Disposition: A | Discharge status: Home / Self Care | Attending: Emergency Medicine | Admitting: Emergency Medicine

## 2024-08-07 ENCOUNTER — Encounter (HOSPITAL_BASED_OUTPATIENT_CLINIC_OR_DEPARTMENT_OTHER): Payer: Self-pay | Admitting: Emergency Medicine

## 2024-08-07 ENCOUNTER — Other Ambulatory Visit: Payer: Self-pay

## 2024-08-07 ENCOUNTER — Emergency Department (HOSPITAL_BASED_OUTPATIENT_CLINIC_OR_DEPARTMENT_OTHER)

## 2024-08-07 DIAGNOSIS — R9389 Abnormal findings on diagnostic imaging of other specified body structures: Secondary | ICD-10-CM | POA: Diagnosis not present

## 2024-08-07 DIAGNOSIS — R600 Localized edema: Secondary | ICD-10-CM | POA: Insufficient documentation

## 2024-08-07 DIAGNOSIS — J45909 Unspecified asthma, uncomplicated: Secondary | ICD-10-CM | POA: Diagnosis not present

## 2024-08-07 DIAGNOSIS — M7989 Other specified soft tissue disorders: Secondary | ICD-10-CM | POA: Diagnosis not present

## 2024-08-07 DIAGNOSIS — R0602 Shortness of breath: Secondary | ICD-10-CM | POA: Diagnosis not present

## 2024-08-07 DIAGNOSIS — R5383 Other fatigue: Secondary | ICD-10-CM | POA: Insufficient documentation

## 2024-08-07 DIAGNOSIS — J9811 Atelectasis: Secondary | ICD-10-CM | POA: Diagnosis not present

## 2024-08-07 DIAGNOSIS — R0600 Dyspnea, unspecified: Secondary | ICD-10-CM

## 2024-08-07 LAB — CBC
HCT: 40.3 % (ref 39.0–52.0)
Hemoglobin: 13.5 g/dL (ref 13.0–17.0)
MCH: 30.3 pg (ref 26.0–34.0)
MCHC: 33.5 g/dL (ref 30.0–36.0)
MCV: 90.6 fL (ref 80.0–100.0)
Platelets: 208 K/uL (ref 150–400)
RBC: 4.45 MIL/uL (ref 4.22–5.81)
RDW: 13.2 % (ref 11.5–15.5)
WBC: 4.2 K/uL (ref 4.0–10.5)
nRBC: 0 % (ref 0.0–0.2)

## 2024-08-07 LAB — BASIC METABOLIC PANEL WITH GFR
Anion gap: 8 (ref 5–15)
BUN: 12 mg/dL (ref 8–23)
CO2: 29 mmol/L (ref 22–32)
Calcium: 9.1 mg/dL (ref 8.9–10.3)
Chloride: 103 mmol/L (ref 98–111)
Creatinine, Ser: 0.89 mg/dL (ref 0.61–1.24)
GFR, Estimated: >60 mL/min (ref >60)
Glucose, Bld: 94 mg/dL (ref 70–99)
Potassium: 4.3 mmol/L (ref 3.5–5.1)
Sodium: 140 mmol/L (ref 135–145)

## 2024-08-07 LAB — PRO BRAIN NATRIURETIC PEPTIDE: Pro Brain Natriuretic Peptide: <50 pg/mL (ref <300.0)

## 2024-08-07 LAB — TROPONIN T, HIGH SENSITIVITY: Troponin T High Sensitivity: <15 ng/L (ref 0–19)

## 2024-08-07 MED ORDER — FUROSEMIDE 40 MG PO TABS: 40.0000 mg | ORAL_TABLET | Freq: Every day | ORAL | 0 refills | Status: AC

## 2024-08-07 NOTE — ED Triage Notes (Signed)
 Pt c/o BLLE swelling x 6 months, worse over last 1.5 months.  C/o fatigue x 3 weeks. Increased shob. Denies fever.   Mechanical fall last night, denies head injury, blood thinners.

## 2024-08-07 NOTE — ED Provider Notes (Addendum)
 [place] [facility] Provider Note   CSN: [id number] Arrival date & time: 08/07/24  1431     Patient presents with: Leg Swelling, Fatigue, and Fall   Cameron [name] is a 78 y.o. male.   78 year old male with a history of chronic lower extremity swelling, asthma, OSA, and elevated BMI presents to the emergency department with shortness of breath and leg swelling.  Patient reports that for the past 6 months he has had bilateral lower extremity swelling.  Intermittently takes hydrochlorothiazide  for it.  Also uses compression wraps.  Says he has chronic foot drop of his right lower extremity after he had a back surgery and was going into his house yesterday and his foot hit the threshold and he fell down onto his side.  No head strike or LOC.  No pain or other injuries since.  Says he has also been having some generalized fatigue over the past few weeks as well as shortness of breath.       Prior to Admission medications   Medication Sig Start Date End Date Taking? Authorizing Provider  furosemide (LASIX) 40 MG tablet Take 1 tablet (40 mg total) by mouth daily. 08/07/24  Yes Yolande Lamar BROCKS, MD  albuterol  (VENTOLIN  HFA) 108 [phone] Base) MCG/ACT inhaler Inhale 2 puffs into the lungs every 6 (six) hours as needed for wheezing or shortness of breath. 10/11/23   Paz, Jose E, MD  Ascorbic Acid  (VITAMIN C ) 1000 MG tablet Take 1,000 mg by mouth 2 (two) times daily.     [provider]  atorvastatin  (LIPITOR) 10 MG tablet TAKE 1 TABLET BY MOUTH AT  BEDTIME 01/25/24   Paz, Jose E, MD  brimonidine (ALPHAGAN) 0.2 % ophthalmic solution 3 drops daily.  06/05/18   [provider]  folic acid  (FOLVITE ) 1 MG tablet TAKE 1 TABLET BY MOUTH DAILY 01/25/24   Paz, Jose E, MD  hydrochlorothiazide  (HYDRODIURIL ) 25 MG tablet Take 1 tablet (25 mg total) by mouth daily. 07/04/24   Paz, Jose E, MD  latanoprost  (XALATAN ) 0.005 % ophthalmic solution Place 1 drop into  both eyes at bedtime.  05/13/14   [provider]  metoprolol  succinate (TOPROL -XL) 25 MG 24 hr tablet TAKE ONE-HALF TABLET BY MOUTH  DAILY WITH OR IMMEDIATELY  FOLLOWING A MEAL 01/25/24   Amon Aloysius BRAVO, MD  MYRBETRIQ  50 MG TB24 tablet Take 50 mg by mouth daily.  04/15/14   [provider]  potassium chloride  (KLOR-CON  M) 10 MEQ tablet Take 1 tablet (10 mEq total) by mouth daily. 09/07/22   Paz, Jose E, MD  solifenacin (VESICARE) 10 MG tablet Take 10 mg by mouth daily.    [provider]    Allergies: Chlorhexidine  gluconate, Ciprofloxacin  hcl, and Clindamycin    Review of Systems  Updated Vital Signs BP (!) 143/83   Pulse 76   Temp 98.6 F (37 C) (Oral)   Resp 18   Ht 6' (1.829 m)   Wt (!) 142.9 kg   SpO2 99%   BMI 42.72 kg/m   Physical Exam Vitals and nursing note reviewed.  Constitutional:      General: He is not in acute distress.    Appearance: He is well-developed.  HENT:     Head: Normocephalic and atraumatic.     Right Ear: External ear normal.     Left Ear: External ear normal.     Nose: Nose normal.  Eyes:     Extraocular Movements: Extraocular  movements intact.     Conjunctiva/sclera: Conjunctivae normal.     Pupils: Pupils are equal, round, and reactive to light.  Cardiovascular:     Rate and Rhythm: Normal rate and regular rhythm.     Heart sounds: Normal heart sounds.  Pulmonary:     Effort: Pulmonary effort is normal. No respiratory distress.     Breath sounds: Normal breath sounds.  Musculoskeletal:     Cervical back: Normal range of motion and neck supple.     Right lower leg: Edema present.     Left lower leg: Edema present.  Skin:    General: Skin is warm and dry.  Neurological:     Mental Status: He is alert. Mental status is at baseline.  Psychiatric:        Mood and Affect: Mood normal.        Behavior: Behavior normal.     (all labs ordered are listed, but only abnormal results are displayed) Labs Reviewed  BASIC  METABOLIC PANEL WITH GFR  CBC  PRO BRAIN NATRIURETIC PEPTIDE  TROPONIN T, HIGH SENSITIVITY    EKG: EKG Interpretation Date/Time:  Tuesday August 07 2024 15:03:26 EDT Ventricular Rate:  74 PR Interval:  177 QRS Duration:  85 QT Interval:  376 QTC Calculation: 418 R Axis:   60  Text Interpretation: Sinus rhythm Confirmed by Yolande Charleston [phone]) on 08/07/2024 7:01:18 PM  Radiology: No results found.    Procedures   Medications Ordered in the ED - No data to display                                  Medical Decision Making Amount and/or Complexity of Data Reviewed Labs: ordered. Radiology: ordered.  Risk Prescription drug management.   Cameron [name] is a 78 year old male chronic lower extremity edema, asthma, OSA, and elevated BMI presents to the emergency department shortness of breath and leg swelling  Initial Ddx:  CHF, lymphedema, venous insufficiency, liver disease, CKD, PE  MDM/Course:  Patient presents emergency department with bilateral lower extremity swelling for months.  Also is having some shortness of breath.  Not currently on a diuretic.  On exam does have lower extremity swelling.  He is satting well on room air.  Does not appear dyspneic.  No significant wheezing or rhonchi.  Chest x-ray without acute abnormality.  BNP and high-sensitivity troponin undetectable low.  Suspect that patient may have chronic venous insufficiency and perhaps may have some deconditioning that is leading him to get winded more easily.  Not having any chest pain, tachycardia, or hypoxia to suggest a PE.  Will go ahead and start him on a diuretic and compression stockings and have him follow-up with his primary doctor and cardiology to see if he needs an echo.  In terms of his fall, does not appear that he has any significant injuries at this point in time.  This patient presents to the ED for concern of complaints listed in HPI, this involves an extensive number of  treatment options, and is a complaint that carries with it a high risk of complications and morbidity. Disposition including potential need for admission considered.   Dispo: DC Home. Return precautions discussed including, but not limited to, those listed in the AVS. Allowed pt time to ask questions which were answered fully prior to dc.  Additional history obtained from spouse Records reviewed Outpatient Clinic Notes The following labs were independently  interpreted: Chemistry and show no acute abnormality I independently reviewed the following imaging with scope of interpretation limited to determining acute life threatening conditions related to emergency care: Chest x-ray and agree with the radiologist interpretation with the following exceptions: none I personally reviewed and interpreted cardiac monitoring: normal sinus rhythm  I personally reviewed and interpreted the pt's EKG: see above for interpretation  I have reviewed the patients home medications and made adjustments as needed Social Determinants of health:  Geriatric  Portions of this note were generated with Scientist, clinical (histocompatibility and immunogenetics). Dictation errors may occur despite best attempts at proofreading.     Final diagnoses:  Other fatigue  Lower extremity edema  Dyspnea, unspecified type    ED Discharge Orders          Ordered    furosemide (LASIX) 40 MG tablet  Daily        08/07/24 1911    Ambulatory referral to Cardiology        08/07/24 1912               Yolande Lamar BROCKS, MD 08/11/24 1049    Yolande Lamar BROCKS, MD 08/11/24 1049

## 2024-08-07 NOTE — Discharge Instructions (Signed)
 You were seen for your lower extremity swelling and shortness of breath in the emergency department.   At home, please take a fluid pill (Lasix) that we have prescribed you for the next 5 days.  Take your potassium while you are on the Lasix.  Use compression stockings for your legs as well  Check your MyChart online for the results of any tests that had not resulted by the time you left the emergency department.   Follow-up with your primary doctor in 2-3 days regarding your visit.  Cardiology will call you about an appointment.  If you do not hear from them in the next 72 business hours please call them using the information in this packet.  Return immediately to the emergency department if you experience any of the following: Chest pain, worsening shortness of breath, or any other concerning symptoms.    Thank you for visiting our Emergency Department. It was a pleasure taking care of you today.

## 2024-08-08 DIAGNOSIS — W57XXXA Bitten or stung by nonvenomous insect and other nonvenomous arthropods, initial encounter: Secondary | ICD-10-CM | POA: Insufficient documentation

## 2024-08-08 DIAGNOSIS — L910 Hypertrophic scar: Secondary | ICD-10-CM | POA: Insufficient documentation

## 2024-08-08 DIAGNOSIS — I10 Essential (primary) hypertension: Secondary | ICD-10-CM | POA: Insufficient documentation

## 2024-08-08 DIAGNOSIS — K219 Gastro-esophageal reflux disease without esophagitis: Secondary | ICD-10-CM | POA: Insufficient documentation

## 2024-08-08 DIAGNOSIS — K5903 Drug induced constipation: Secondary | ICD-10-CM | POA: Insufficient documentation

## 2024-08-08 DIAGNOSIS — M1990 Unspecified osteoarthritis, unspecified site: Secondary | ICD-10-CM | POA: Insufficient documentation

## 2024-08-08 DIAGNOSIS — H409 Unspecified glaucoma: Secondary | ICD-10-CM | POA: Insufficient documentation

## 2024-08-08 DIAGNOSIS — T7840XA Allergy, unspecified, initial encounter: Secondary | ICD-10-CM | POA: Insufficient documentation

## 2024-08-08 DIAGNOSIS — E7800 Pure hypercholesterolemia, unspecified: Secondary | ICD-10-CM | POA: Insufficient documentation

## 2024-08-08 DIAGNOSIS — D509 Iron deficiency anemia, unspecified: Secondary | ICD-10-CM | POA: Insufficient documentation

## 2024-08-08 DIAGNOSIS — M47816 Spondylosis without myelopathy or radiculopathy, lumbar region: Secondary | ICD-10-CM | POA: Insufficient documentation

## 2024-08-09 ENCOUNTER — Ambulatory Visit: Attending: Cardiology | Admitting: Cardiology

## 2024-08-09 ENCOUNTER — Encounter: Payer: Self-pay | Admitting: Cardiology

## 2024-08-09 VITALS — BP 130/90 | HR 83 | Ht 72.0 in | Wt 315.8 lb

## 2024-08-09 DIAGNOSIS — E7800 Pure hypercholesterolemia, unspecified: Secondary | ICD-10-CM

## 2024-08-09 DIAGNOSIS — I10 Essential (primary) hypertension: Secondary | ICD-10-CM | POA: Diagnosis not present

## 2024-08-09 DIAGNOSIS — G4733 Obstructive sleep apnea (adult) (pediatric): Secondary | ICD-10-CM

## 2024-08-09 DIAGNOSIS — R0609 Other forms of dyspnea: Secondary | ICD-10-CM | POA: Diagnosis not present

## 2024-08-09 NOTE — Patient Instructions (Signed)
 Medication Instructions:  Your physician recommends that you continue on your current medications as directed. Please refer to the Current Medication list given to you today.  *If you need a refill on your cardiac medications before your next appointment, please call your pharmacy*   Lab Work: None ordered If you have labs (blood work) drawn today and your tests are completely normal, you will receive your results only by: MyChart Message (if you have MyChart) OR A paper copy in the mail If you have any lab test that is abnormal or we need to change your treatment, we will call you to review the results.  Testing/Procedures: Your physician has requested that you have an echocardiogram. Echocardiography is a painless test that uses sound waves to create images of your heart. It provides your doctor with information about the size and shape of your heart and how well your heart's chambers and valves are working. This procedure takes approximately one hour. There are no restrictions for this procedure. Please do NOT wear cologne, perfume, aftershave, or lotions (deodorant is allowed). Please arrive 15 minutes prior to your appointment time.  Please note: We ask at that you not bring children with you during ultrasound (echo/ vascular) testing. Due to room size and safety concerns, children are not allowed in the ultrasound rooms during exams. Our front office staff cannot provide observation of children in our lobby area while testing is being conducted. An adult accompanying a patient to their appointment will only be allowed in the ultrasound room at the discretion of the ultrasound technician under special circumstances. We apologize for any inconvenience.    [facility] at [facility] [address] [place], [place] [address] Phone: [phone]  August 09, 2024    Cameron [name] DOB: [birth year] MRN: [id number] [address] [place] [address]    Dear Cameron [name],  Please arrive 15 minutes prior to your appointment time for registration and insurance purposes.  The test will take approximately 3 to 4 hours to complete; you may bring reading material.  If someone comes with you to your appointment, they will need to remain in the main lobby due to limited space in the testing area. **If you are pregnant or breastfeeding, please notify the nuclear lab prior to your appointment**  How to prepare for your Myocardial Perfusion Test: Do not eat or drink 3 hours prior to your test, except you may have water. Do not consume products containing caffeine (regular or decaffeinated) 12 hours prior to your test. (ex: coffee, chocolate, sodas, tea). Do bring a list of your current medications with you.  If not listed below, you may take your medications as normal. Do not take metoprolol  (Lopressor , Toprol ) for 24 hours prior to the test.  Bring the medication to your appointment as you may be required to take it once the test is complete. Do wear comfortable clothes (no dresses or overalls) and walking shoes, tennis shoes preferred (No heels or open toe shoes are allowed). Do NOT wear cologne, perfume, aftershave, or lotions (deodorant is allowed). If these instructions are not followed, your test will have to be rescheduled.  Please report to [address] (The [facility] 2nd Floor, for your test.  If you have questions or concerns about your appointment, you can call the Nuclear Lab at [phone].  If you cannot keep your appointment, please provide 24 hours notification to the Nuclear Lab, to avoid a possible $  50 charge to your account.  Follow-Up: At [facility], you and your health needs are our priority.  As part of our continuing mission to provide you with exceptional heart care, we have created designated Provider Care Teams.  These Care Teams include your primary Cardiologist  (physician) and Advanced Practice Providers (APPs -  Physician Assistants and Nurse Practitioners) who all work together to provide you with the care you need, when you need it.  We recommend signing up for the patient portal called MyChart.  Sign up information is provided on this After Visit Summary.  MyChart is used to connect with patients for Virtual Visits (Telemedicine).  Patients are able to view lab/test results, encounter notes, upcoming appointments, etc.  Non-urgent messages can be sent to your provider as well.   To learn more about what you can do with MyChart, go to [email or web address].    Your next appointment:   6 month(s)  The format for your next appointment:   In Person  Provider:   Jennifer Crape, MD   Other Instructions Echocardiogram An echocardiogram is a test that uses sound waves (ultrasound) to produce images of the heart. Images from an echocardiogram can provide important information about: Heart size and shape. The size and thickness and movement of your heart's walls. Heart muscle function and strength. Heart valve function or if you have stenosis. Stenosis is when the heart valves are too narrow. If blood is flowing backward through the heart valves (regurgitation). A tumor or infectious growth around the heart valves. Areas of heart muscle that are not working well because of poor blood flow or injury from a heart attack. Aneurysm detection. An aneurysm is a weak or damaged part of an artery wall. The wall bulges out from the normal force of blood pumping through the body. Tell a health care provider about: Any allergies you have. All medicines you are taking, including vitamins, herbs, eye drops, creams, and over-the-counter medicines. Any blood disorders you have. Any surgeries you have had. Any medical conditions you have. Whether you are pregnant or may be pregnant. What are the risks? Generally, this is a safe test. However, problems may  occur, including an allergic reaction to dye (contrast) that may be used during the test. What happens before the test? No specific preparation is needed. You may eat and drink normally. What happens during the test? You will take off your clothes from the waist up and put on a hospital gown. Electrodes or electrocardiogram (ECG)patches may be placed on your chest. The electrodes or patches are then connected to a device that monitors your heart rate and rhythm. You will lie down on a table for an ultrasound exam. A gel will be applied to your chest to help sound waves pass through your skin. A handheld device, called a transducer, will be pressed against your chest and moved over your heart. The transducer produces sound waves that travel to your heart and bounce back (or echo back) to the transducer. These sound waves will be captured in real-time and changed into images of your heart that can be viewed on a video monitor. The images will be recorded on a computer and reviewed by your health care provider. You may be asked to change positions or hold your breath for a short time. This makes it easier to get different views or better views of your heart. In some cases, you may receive contrast through an IV in one of your veins. This can improve the  quality of the pictures from your heart. The procedure may vary among health care providers and hospitals.   What can I expect after the test? You may return to your normal, everyday life, including diet, activities, and medicines, unless your health care provider tells you not to do that. Follow these instructions at home: It is up to you to get the results of your test. Ask your health care provider, or the department that is doing the test, when your results will be ready. Keep all follow-up visits. This is important. Summary An echocardiogram is a test that uses sound waves (ultrasound) to produce images of the heart. Images from an  echocardiogram can provide important information about the size and shape of your heart, heart muscle function, heart valve function, and other possible heart problems. You do not need to do anything to prepare before this test. You may eat and drink normally. After the echocardiogram is completed, you may return to your normal, everyday life, unless your health care provider tells you not to do that. This information is not intended to replace advice given to you by your health care provider. Make sure you discuss any questions you have with your health care provider. Document Revised: 06/17/2020 Document Reviewed: 06/17/2020 Elsevier Patient Education  2021 [employer]  Cardiac Nuclear Scan A cardiac nuclear scan is a test that is done to check the flow of blood to your heart. It is done when you are resting and when you are exercising. The test looks for problems such as: Not enough blood reaching a portion of the heart. The heart muscle not working as it should. You may need this test if you have: Heart disease. Lab results that are not normal. Had heart surgery or a balloon procedure to open up blocked arteries (angioplasty) or a small mesh tube (stent). Chest pain. Shortness of breath. Had a heart attack. In this test, a special dye (tracer) is put into your bloodstream. The tracer will travel to your heart. A camera will then take pictures of your heart to see how the tracer moves through your heart. This test is usually done at a hospital and takes 2-4 hours. Tell a doctor about: Any allergies you have. All medicines you are taking, including vitamins, herbs, eye drops, creams, and over-the-counter medicines. Any bleeding problems you have. Any surgeries you have had. Any medical conditions you have. Whether you are pregnant or may be pregnant. Any history of asthma or long-term (chronic) lung disease. Any history of heart rhythm disorders or heart valve conditions. What are  the risks? Your doctor will talk with you about risks. These may include: Serious chest pain and heart attack. This is only a risk if the stress portion of the test is done. Fast or uneven heartbeats (palpitations). A feeling of warmth in your chest. This feeling usually does not last long. Allergic reaction to the tracer. Shortness of breath or trouble breathing. What happens before the test? Ask your doctor about changing or stopping your normal medicines. Follow instructions from your doctor about what you cannot eat or drink. Remove your jewelry on the day of the test. Ask your doctor if you need to avoid nicotine or caffeine. What happens during the test? An IV tube will be inserted into one of your veins. Your doctor will give you a small amount of tracer through the IV tube. You will wait for 20-40 minutes while the tracer moves through your bloodstream. Your heart will be monitored with  an electrocardiogram (ECG). You will lie down on an exam table. Pictures of your heart will be taken for about 15-20 minutes. You may also have a stress test. For this test, one of these things may be done: You will be asked to exercise on a treadmill or a stationary bike. You will be given medicines that will make your heart work harder. This is done if you are unable to exercise. When blood flow to your heart has peaked, a tracer will again be given through the IV tube. After 20-40 minutes, you will get back on the exam table. More pictures will be taken of your heart. Depending on the tracer that is used, more pictures may need to be taken 3-4 hours later. Your IV tube will be removed when the test is over. The test may vary among doctors and hospitals. What happens after the test? Ask your doctor: Whether you can return to your normal schedule, including diet, activities, travel, and medicines. Whether you should drink more fluids. This will help to remove the tracer from your body. Ask your  doctor, or the department that is doing the test: When will my results be ready? How will I get my results? What are my treatment options? What other tests do I need? What are my next steps? This information is not intended to replace advice given to you by your health care provider. Make sure you discuss any questions you have with your health care provider. Document Revised: 03/23/2022 Document Reviewed: 03/23/2022 Elsevier Patient Education  2024 [employer] Important Information About Sugar

## 2024-08-09 NOTE — Progress Notes (Signed)
 Cardiology Office Note:    Date:  08/09/2024   ID:  Cameron [name], DOB 1946/10/09, MRN [id number]  PCP:  Amon Aloysius BRAVO, MD  Cardiologist:  Jennifer JONELLE Crape, MD   Referring MD: Yolande Lamar BROCKS, MD    ASSESSMENT:    1. Essential hypertension   2. OSA on CPAP   3. Morbid obesity (HCC)   4. High cholesterol    PLAN:    In order of problems listed above:  Primary prevention stressed with the patient.  Importance of compliance with diet medication stressed and patient verbalized standing. Essential hypertension: Blood pressure stable and diet was emphasized.  Lifestyle modification urged.  Salt intake issues were discussed. Dyspnea on exertion: Patient has multiple risk factors for coronary artery disease so we will rule out an ischemic substrate.  Will put him in for an exercise stress Cardiolite.  He is agreeable. Lipidemia: Diet was emphasized.  Lipids reviewed and discussed Obesity: Weight reduction stressed diet emphasized risks of obesity explained and he promises to do better. Cardiac murmur: Echocardiogram will be done to assess murmur heard on auscultation. Patient will be seen in follow-up appointment in 6 months or earlier if the patient has any concerns.    Medication Adjustments/Labs and Tests Ordered: Current medicines are reviewed at length with the patient today.  Concerns regarding medicines are outlined above.  Orders Placed This Encounter  Procedures   EKG 12-Lead   No orders of the defined types were placed in this encounter.    History of Present Illness:    Cameron [name] is a 78 y.o. male who is being seen today for the evaluation of dyspnea on exertion and pedal edema at the request of Yolande Lamar BROCKS, MD. patient is a pleasant 78 year old male.  He has past medical history of essential hypertension dyslipidemia and morbid obesity.  He leads a sedentary lifestyle.  He denies any chest pain orthopnea or PND.  He takes care of activities of  daily living.  At the time of my evaluation, the patient is alert awake oriented and in no distress.  He has dyspnea on exertion.  Past Medical History:  Diagnosis Date   Abnormal prostate specific antigen 08/16/2014   Allergy 2013   Annual physical exam 03/30/2012   Arthritis    degenerative joint disease, knees    Asthma    B12 deficiency 03/30/2012   Constipation due to pain medication    Cubital tunnel syndrome 05/19/2014   Detrusor muscle hypertonia 08/16/2014   Diverticulosis of colon    DJD (degenerative joint disease) 06/09/2022   Esophageal reflux 04/16/2011   Essential hypertension 08/02/2007   Qualifier: Diagnosis of   By: Josetta Corrigan         Fatty liver    GERD (gastroesophageal reflux disease)    takes Prevacid   Glaucoma (increased eye pressure)    Healthcare maintenance 09/26/2019   High cholesterol    HTN (hypertension)    Iron deficiency anemia    h/o     Keloid of skin    Lumbar spondylosis    possible right sided radiculopathy with hx of surgery by Dr. Lindalee in 2003   Lumbar stenosis 08/22/2015   Morbid obesity (HCC) 06/11/2014   OSA on CPAP 03/13/2009   NPSG 2001:  AHI 87/hr     Osteopenia    DEXA 12-2006     Prostatitis, acute 04/2011   admitted to [facility]   Tick bite     Past Surgical  History:  Procedure Laterality Date   APPENDECTOMY  2017   BACK SURGERY  2003   CHOLECYSTECTOMY  2003   COLONOSCOPY  2018   HIP ARTHROSCOPY Left 08/26/2022   JOINT REPLACEMENT  06/2014. 10/2014. 08/2022   Both knees. Left hip   LAPAROSCOPIC APPENDECTOMY N/A 01/16/2016   Procedure: APPENDECTOMY LAPAROSCOPIC;  Surgeon: Elon Pacini, MD;  Location: [facility] OR;  Service: General;  Laterality: N/A;   LUMBAR LAMINECTOMY/DECOMPRESSION MICRODISCECTOMY N/A 08/22/2015   Procedure: Lumbar two to lumbar three lumbar three to lumbar four microdiskectomy;  Surgeon: Rockey Peru, MD;  Location: [facility];  Service: Neurosurgery;  Laterality: N/A;  L23 L34 microdiskectomy   SPINE  SURGERY  02/2002. 07/2015   TOTAL KNEE ARTHROPLASTY Right 06/11/2014   Procedure: TOTAL KNEE ARTHROPLASTY;  Surgeon: Maude KANDICE Herald, MD;  Location: [facility] OR;  Service: Orthopedics;  Laterality: Right;   TOTAL KNEE ARTHROPLASTY Left 10/08/2014   DR DALLDORF   TOTAL KNEE ARTHROPLASTY Left 10/08/2014   Procedure: TOTAL LEFT KNEE ARTHROPLASTY;  Surgeon: Maude KANDICE Herald, MD;  Location: [facility] OR;  Service: Orthopedics;  Laterality: Left;    Current Medications: Current Meds  Medication Sig   albuterol  (VENTOLIN  HFA) 108 (90 Base) MCG/ACT inhaler Inhale 2 puffs into the lungs every 6 (six) hours as needed for wheezing or shortness of breath.   Ascorbic Acid  (VITAMIN C ) 1000 MG tablet Take 1,000 mg by mouth 2 (two) times daily.    atorvastatin  (LIPITOR) 10 MG tablet TAKE 1 TABLET BY MOUTH AT  BEDTIME   brimonidine (ALPHAGAN) 0.2 % ophthalmic solution 3 drops daily.    folic acid  (FOLVITE ) 1 MG tablet TAKE 1 TABLET BY MOUTH DAILY   furosemide (LASIX) 40 MG tablet Take 1 tablet (40 mg total) by mouth daily.   hydrochlorothiazide  (HYDRODIURIL ) 25 MG tablet Take 1 tablet (25 mg total) by mouth daily.   latanoprost  (XALATAN ) 0.005 % ophthalmic solution Place 1 drop into both eyes at bedtime.    metoprolol  succinate (TOPROL -XL) 25 MG 24 hr tablet TAKE ONE-HALF TABLET BY MOUTH  DAILY WITH OR IMMEDIATELY  FOLLOWING A MEAL   MYRBETRIQ  50 MG TB24 tablet Take 50 mg by mouth daily.    potassium chloride  (KLOR-CON  M) 10 MEQ tablet Take 1 tablet (10 mEq total) by mouth daily.   solifenacin (VESICARE) 10 MG tablet Take 10 mg by mouth daily.     Allergies:   Chlorhexidine  gluconate, Ciprofloxacin  hcl, and Clindamycin   Social History   Socioeconomic History   Marital status: Married    Spouse name: Not on file   Number of children: 2   Years of education: Not on file   Highest education level: Bachelor's degree (e.g., BA, AB, BS)  Occupational History   Occupation: retired--works in HR  Tobacco Use    Smoking status: Never   Smokeless tobacco: Never  Vaping Use   Vaping status: Never Used  Substance and Sexual Activity   Alcohol use: Yes    Comment: Occasional liquor   Drug use: No   Sexual activity: Yes    Birth control/protection: None  Other Topics Concern   Not on file  Social History Narrative   Household- pt and wife   Used to plays tennis sometimes    Social Drivers of Corporate investment banker Strain: Low Risk  (05/21/2024)   Overall Financial Resource Strain (CARDIA)    Difficulty of Paying Living Expenses: Not hard at all  Food Insecurity: No Food Insecurity (05/21/2024)  Hunger Vital Sign    Worried About Running Out of Food in the Last Year: Never true    Ran Out of Food in the Last Year: Never true  Transportation Needs: No Transportation Needs (05/21/2024)   PRAPARE - Administrator, Civil Service (Medical): No    Lack of Transportation (Non-Medical): No  Physical Activity: Insufficiently Active (05/21/2024)   Exercise Vital Sign    Days of Exercise per Week: 2 days    Minutes of Exercise per Session: 30 min  Stress: No Stress Concern Present (05/21/2024)   [employer] of Occupational Health - Occupational Stress Questionnaire    Feeling of Stress: Not at all  Social Connections: Socially Integrated (05/21/2024)   Social Connection and Isolation Panel    Frequency of Communication with Friends and Family: Twice a week    Frequency of Social Gatherings with Friends and Family: Three times a week    Attends Religious Services: More than 4 times per year    Active Member of Clubs or Organizations: Yes    Attends Engineer, structural: More than 4 times per year    Marital Status: Married     Family History: The patient's family history includes Asthma in his daughter and paternal grandfather; Breast cancer in his maternal aunt; Cancer in his maternal aunt, maternal grandfather, and maternal uncle; Colon cancer in his maternal uncle  and another family member; Dementia in his mother; Diverticulitis in his father; Drug abuse in his brother; Stroke in an other family member. There is no history of Prostate cancer, Heart attack, Hypertension, Diabetes, Rectal cancer, or Stomach cancer.  ROS:   Please see the history of present illness.    All other systems reviewed and are negative.  EKGs/Labs/Other Studies Reviewed:    The following studies were reviewed today:  EKG Interpretation Date/Time:  Thursday August 09 2024 15:37:51 EDT Ventricular Rate:  83 PR Interval:  166 QRS Duration:  82 QT Interval:  342 QTC Calculation: 401 R Axis:   -3  Text Interpretation: Normal sinus rhythm Inferior infarct , age undetermined Anterior infarct , age undetermined Abnormal ECG When compared with ECG of 07-Aug-2024 15:03, PREVIOUS ECG IS PRESENT Confirmed by Edwyna Backers [phone]) on 08/09/2024 3:50:08 PM     Recent Labs: 12/13/2023: ALT 18 08/07/2024: BUN 12; Creatinine, Ser 0.89; Hemoglobin 13.5; Platelets 208; Potassium 4.3; Pro Brain Natriuretic Peptide <50.0; Sodium 140  Recent Lipid Panel    Component Value Date/Time   CHOL 114 12/13/2023 1343   TRIG 40.0 12/13/2023 1343   HDL 42.50 12/13/2023 1343   CHOLHDL 3 12/13/2023 1343   VLDL 8.0 12/13/2023 1343   LDLCALC 64 12/13/2023 1343    Physical Exam:    VS:  BP (!) 130/90   Pulse 83   Ht 6' (1.829 m)   Wt (!) 315 lb 12.8 oz (143.2 kg)   SpO2 95%   BMI 42.83 kg/m     Wt Readings from Last 3 Encounters:  08/09/24 (!) 315 lb 12.8 oz (143.2 kg)  08/07/24 (!) 315 lb (142.9 kg)  05/22/24 (!) 321 lb (145.6 kg)     GEN: Patient is in no acute distress HEENT: Normal NECK: No JVD; No carotid bruits LYMPHATICS: No lymphadenopathy CARDIAC: S1 S2 regular, 2/6 systolic murmur at the apex. RESPIRATORY:  Clear to auscultation without rales, wheezing or rhonchi  ABDOMEN: Soft, non-tender, non-distended MUSCULOSKELETAL:  No edema; No deformity  SKIN: Warm and  dry NEUROLOGIC:  Alert and oriented  x 3 PSYCHIATRIC:  Normal affect    Signed, Jennifer JONELLE Crape, MD  08/09/2024 3:51 PM    [facility]

## 2024-08-20 DIAGNOSIS — M25511 Pain in right shoulder: Secondary | ICD-10-CM | POA: Diagnosis not present

## 2024-08-21 ENCOUNTER — Other Ambulatory Visit: Payer: Self-pay | Admitting: Internal Medicine

## 2024-09-03 ENCOUNTER — Encounter (HOSPITAL_COMMUNITY): Payer: Self-pay | Admitting: *Deleted

## 2024-09-10 ENCOUNTER — Other Ambulatory Visit: Payer: Self-pay | Admitting: Cardiology

## 2024-09-10 DIAGNOSIS — R0609 Other forms of dyspnea: Secondary | ICD-10-CM

## 2024-09-12 ENCOUNTER — Ambulatory Visit (HOSPITAL_COMMUNITY)
Admission: RE | Admit: 2024-09-12 | Discharge: 2024-09-12 | Disposition: A | Discharge status: Home / Self Care | Source: Ambulatory Visit | Attending: Cardiology | Admitting: Cardiology

## 2024-09-12 ENCOUNTER — Ambulatory Visit: Payer: Self-pay | Admitting: Cardiology

## 2024-09-12 DIAGNOSIS — R0609 Other forms of dyspnea: Secondary | ICD-10-CM | POA: Diagnosis present

## 2024-09-12 LAB — ECHOCARDIOGRAM COMPLETE
Area-P 1/2: 2.82 cm2
S' Lateral: 2.75 cm

## 2024-09-12 LAB — MYOCARDIAL PERFUSION IMAGING
Angina Index: 0
Estimated workload: 4.6
Exercise duration (min): 4 min
Exercise duration (sec): 0 s
LV dias vol: 97 mL (ref 62–150)
LV sys vol: 22 mL (ref 4.2–5.8)
MPHR: 145 {beats}/min
Nuc Stress EF: 77 %
Peak HR: 157 {beats}/min
Percent HR: 110 %
Rest HR: 103 {beats}/min
Rest Nuclear Isotope Dose: 11.8 mCi
SDS: 0
SRS: 0
SSS: 0
Stress Nuclear Isotope Dose: 38.1 mCi
TID: 1.18

## 2024-09-12 MED ORDER — TECHNETIUM TC 99M TETROFOSMIN IV KIT
38.1000 | PACK | Freq: Once | INTRAVENOUS | Status: AC | PRN
  Administered 2024-09-12: 38.1 via INTRAVENOUS

## 2024-09-12 MED ORDER — TECHNETIUM TC 99M TETROFOSMIN IV KIT
11.8000 | PACK | Freq: Once | INTRAVENOUS | Status: AC | PRN
  Administered 2024-09-12: 11.8 via INTRAVENOUS

## 2024-09-18 ENCOUNTER — Ambulatory Visit: Payer: Self-pay | Admitting: Cardiology

## 2024-11-18 ENCOUNTER — Other Ambulatory Visit: Payer: Self-pay | Admitting: Internal Medicine

## 2025-05-28 ENCOUNTER — Ambulatory Visit
